# Patient Record
Sex: Female | Born: 1935 | Race: Black or African American | Hispanic: No | Smoking: Never smoker
Health system: Southern US, Community
[De-identification: ages and names within clinical notes are randomized; demographics above are authoritative.]

## PROBLEM LIST (undated history)

## (undated) DIAGNOSIS — R109 Unspecified abdominal pain: Secondary | ICD-10-CM

## (undated) DIAGNOSIS — I739 Peripheral vascular disease, unspecified: Secondary | ICD-10-CM

## (undated) DIAGNOSIS — J189 Pneumonia, unspecified organism: Secondary | ICD-10-CM

## (undated) DIAGNOSIS — M1990 Unspecified osteoarthritis, unspecified site: Secondary | ICD-10-CM

## (undated) DIAGNOSIS — M889 Osteitis deformans of unspecified bone: Secondary | ICD-10-CM

## (undated) DIAGNOSIS — I10 Essential (primary) hypertension: Secondary | ICD-10-CM

## (undated) DIAGNOSIS — R809 Proteinuria, unspecified: Secondary | ICD-10-CM

## (undated) DIAGNOSIS — R05 Cough: Secondary | ICD-10-CM

## (undated) DIAGNOSIS — R058 Other specified cough: Secondary | ICD-10-CM

## (undated) DIAGNOSIS — K635 Polyp of colon: Secondary | ICD-10-CM

## (undated) DIAGNOSIS — R32 Unspecified urinary incontinence: Secondary | ICD-10-CM

## (undated) DIAGNOSIS — H9190 Unspecified hearing loss, unspecified ear: Secondary | ICD-10-CM

## (undated) DIAGNOSIS — R609 Edema, unspecified: Secondary | ICD-10-CM

## (undated) DIAGNOSIS — F419 Anxiety disorder, unspecified: Secondary | ICD-10-CM

## (undated) DIAGNOSIS — I639 Cerebral infarction, unspecified: Secondary | ICD-10-CM

## (undated) DIAGNOSIS — R269 Unspecified abnormalities of gait and mobility: Secondary | ICD-10-CM

## (undated) DIAGNOSIS — K5900 Constipation, unspecified: Secondary | ICD-10-CM

## (undated) DIAGNOSIS — I7090 Unspecified atherosclerosis: Secondary | ICD-10-CM

## (undated) DIAGNOSIS — M109 Gout, unspecified: Secondary | ICD-10-CM

## (undated) DIAGNOSIS — N189 Chronic kidney disease, unspecified: Secondary | ICD-10-CM

## (undated) DIAGNOSIS — K649 Unspecified hemorrhoids: Secondary | ICD-10-CM

## (undated) HISTORY — PX: PARTIAL HYSTERECTOMY: SHX80

---

## 2010-02-14 ENCOUNTER — Ambulatory Visit: Payer: Self-pay | Admitting: Gastroenterology

## 2011-02-21 ENCOUNTER — Ambulatory Visit: Payer: Self-pay

## 2011-03-21 ENCOUNTER — Emergency Department: Payer: Self-pay | Admitting: Internal Medicine

## 2011-03-21 LAB — COMPREHENSIVE METABOLIC PANEL
Albumin: 3.4 g/dL (ref 3.4–5.0)
Anion Gap: 11 (ref 7–16)
BUN: 20 mg/dL — ABNORMAL HIGH (ref 7–18)
Bilirubin,Total: 0.5 mg/dL (ref 0.2–1.0)
Chloride: 103 mmol/L (ref 98–107)
Creatinine: 1.64 mg/dL — ABNORMAL HIGH (ref 0.60–1.30)
Glucose: 136 mg/dL — ABNORMAL HIGH (ref 65–99)
Potassium: 3.9 mmol/L (ref 3.5–5.1)
SGOT(AST): 14 U/L — ABNORMAL LOW (ref 15–37)
Total Protein: 7.9 g/dL (ref 6.4–8.2)

## 2011-03-21 LAB — CBC
HCT: 38 % (ref 35.0–47.0)
MCH: 31.6 pg (ref 26.0–34.0)
MCHC: 32.8 g/dL (ref 32.0–36.0)
MCV: 97 fL (ref 80–100)
RDW: 14.5 % (ref 11.5–14.5)
WBC: 9.5 10*3/uL (ref 3.6–11.0)

## 2011-03-21 LAB — TROPONIN I: Troponin-I: <0.02 ng/mL

## 2011-03-21 LAB — CK TOTAL AND CKMB (NOT AT ARMC): CK-MB: 0.5 ng/mL (ref 0.5–3.6)

## 2011-07-10 ENCOUNTER — Ambulatory Visit: Payer: Self-pay | Admitting: Internal Medicine

## 2011-07-23 ENCOUNTER — Ambulatory Visit: Payer: Self-pay

## 2012-10-27 ENCOUNTER — Emergency Department: Payer: Self-pay | Admitting: Emergency Medicine

## 2013-03-09 ENCOUNTER — Emergency Department: Payer: Self-pay | Admitting: Emergency Medicine

## 2013-03-09 LAB — COMPREHENSIVE METABOLIC PANEL
Anion Gap: 3 — ABNORMAL LOW (ref 7–16)
BUN: 28 mg/dL — ABNORMAL HIGH (ref 7–18)
Chloride: 108 mmol/L — ABNORMAL HIGH (ref 98–107)
Co2: 28 mmol/L (ref 21–32)
EGFR (African American): 38 — ABNORMAL LOW
EGFR (Non-African Amer.): 33 — ABNORMAL LOW
Glucose: 150 mg/dL — ABNORMAL HIGH (ref 65–99)
SGPT (ALT): 13 U/L (ref 12–78)
Sodium: 139 mmol/L (ref 136–145)

## 2013-03-09 LAB — CBC
HCT: 37.8 % (ref 35.0–47.0)
MCH: 31.1 pg (ref 26.0–34.0)
MCHC: 33.4 g/dL (ref 32.0–36.0)
MCV: 93 fL (ref 80–100)
Platelet: 188 10*3/uL (ref 150–440)
RBC: 4.06 10*6/uL (ref 3.80–5.20)
RDW: 14 % (ref 11.5–14.5)

## 2013-03-09 LAB — RAPID INFLUENZA A&B ANTIGENS

## 2013-05-28 ENCOUNTER — Emergency Department: Payer: Self-pay | Admitting: Emergency Medicine

## 2013-05-28 LAB — URINALYSIS, COMPLETE
BACTERIA: NONE SEEN
BILIRUBIN, UR: NEGATIVE
BLOOD: NEGATIVE
Glucose,UR: NEGATIVE mg/dL (ref 0–75)
Ketone: NEGATIVE
NITRITE: NEGATIVE
PH: 5 (ref 4.5–8.0)
Protein: NEGATIVE
Specific Gravity: 1.019 (ref 1.003–1.030)
Squamous Epithelial: 1

## 2013-08-19 ENCOUNTER — Emergency Department: Payer: Self-pay | Admitting: Emergency Medicine

## 2013-08-31 LAB — BASIC METABOLIC PANEL
BUN: 15 mg/dL (ref 4–21)
Creatinine: 1.2 mg/dL — AB (ref <=1.1)

## 2013-08-31 LAB — TSH: TSH: 0.95 u[IU]/mL (ref <=5.90)

## 2013-09-09 LAB — HM COLONOSCOPY: HM Colonoscopy: 3

## 2013-09-14 ENCOUNTER — Ambulatory Visit: Payer: Self-pay | Admitting: Gastroenterology

## 2014-02-09 LAB — HM MAMMOGRAPHY: HM Mammogram: NORMAL

## 2014-02-19 ENCOUNTER — Ambulatory Visit: Payer: Self-pay | Admitting: Internal Medicine

## 2014-02-26 ENCOUNTER — Emergency Department: Payer: Self-pay | Admitting: Emergency Medicine

## 2014-02-26 LAB — CBC WITH DIFFERENTIAL/PLATELET
Basophil #: 0 10*3/uL (ref 0.0–0.1)
Basophil %: 0.4 %
EOS PCT: 0.2 %
Eosinophil #: 0 10*3/uL (ref 0.0–0.7)
HCT: 41.4 % (ref 35.0–47.0)
HGB: 13.4 g/dL (ref 12.0–16.0)
LYMPHS ABS: 1.7 10*3/uL (ref 1.0–3.6)
LYMPHS PCT: 19.4 %
MCH: 31.3 pg (ref 26.0–34.0)
MCHC: 32.3 g/dL (ref 32.0–36.0)
MCV: 97 fL (ref 80–100)
Monocyte #: 0.4 x10 3/mm (ref 0.2–0.9)
Monocyte %: 4.5 %
Neutrophil #: 6.6 10*3/uL — ABNORMAL HIGH (ref 1.4–6.5)
Neutrophil %: 75.5 %
Platelet: 228 10*3/uL (ref 150–440)
RBC: 4.27 10*6/uL (ref 3.80–5.20)
RDW: 14.5 % (ref 11.5–14.5)
WBC: 8.8 10*3/uL (ref 3.6–11.0)

## 2014-02-26 LAB — BASIC METABOLIC PANEL
ANION GAP: 10 (ref 7–16)
BUN: 25 mg/dL — ABNORMAL HIGH (ref 7–18)
CALCIUM: 9 mg/dL (ref 8.5–10.1)
CREATININE: 1.46 mg/dL — AB (ref 0.60–1.30)
Chloride: 107 mmol/L (ref 98–107)
Co2: 24 mmol/L (ref 21–32)
EGFR (African American): 44 — ABNORMAL LOW
EGFR (Non-African Amer.): 36 — ABNORMAL LOW
Glucose: 158 mg/dL — ABNORMAL HIGH (ref 65–99)
OSMOLALITY: 289 (ref 275–301)
POTASSIUM: 3.7 mmol/L (ref 3.5–5.1)
SODIUM: 141 mmol/L (ref 136–145)

## 2014-02-26 LAB — URINALYSIS, COMPLETE
Bacteria: NONE SEEN
Bilirubin,UR: NEGATIVE
Blood: NEGATIVE
GLUCOSE, UR: NEGATIVE mg/dL (ref 0–75)
Granular Cast: 12
Hyaline Cast: 15
Ketone: NEGATIVE
NITRITE: NEGATIVE
Ph: 5 (ref 4.5–8.0)
Protein: 100
RBC,UR: 23 /HPF (ref 0–5)
Specific Gravity: 1.024 (ref 1.003–1.030)
Squamous Epithelial: 5
WBC UR: 435 /HPF (ref 0–5)

## 2014-02-26 LAB — TROPONIN I
Troponin-I: 0.1 ng/mL — ABNORMAL HIGH
Troponin-I: 0.12 ng/mL — ABNORMAL HIGH

## 2014-03-02 ENCOUNTER — Emergency Department: Payer: Self-pay | Admitting: Emergency Medicine

## 2014-03-02 LAB — URINALYSIS, COMPLETE
Bilirubin,UR: NEGATIVE
Blood: NEGATIVE
GLUCOSE, UR: NEGATIVE mg/dL (ref 0–75)
KETONE: NEGATIVE
Nitrite: NEGATIVE
PH: 6 (ref 4.5–8.0)
SPECIFIC GRAVITY: 1.025 (ref 1.003–1.030)
WBC UR: 21 /HPF (ref 0–5)

## 2014-03-02 LAB — CBC
HCT: 37.9 % (ref 35.0–47.0)
HGB: 13 g/dL (ref 12.0–16.0)
MCH: 33.1 pg (ref 26.0–34.0)
MCHC: 34.3 g/dL (ref 32.0–36.0)
MCV: 97 fL (ref 80–100)
Platelet: 198 10*3/uL (ref 150–440)
RBC: 3.92 10*6/uL (ref 3.80–5.20)
RDW: 14.1 % (ref 11.5–14.5)
WBC: 6 10*3/uL (ref 3.6–11.0)

## 2014-03-02 LAB — COMPREHENSIVE METABOLIC PANEL
ALK PHOS: 71 U/L
Albumin: 3.5 g/dL (ref 3.4–5.0)
Anion Gap: 8 (ref 7–16)
BILIRUBIN TOTAL: 0.4 mg/dL (ref 0.2–1.0)
BUN: 18 mg/dL (ref 7–18)
CHLORIDE: 109 mmol/L — AB (ref 98–107)
CO2: 25 mmol/L (ref 21–32)
Calcium, Total: 8.7 mg/dL (ref 8.5–10.1)
Creatinine: 1.46 mg/dL — ABNORMAL HIGH (ref 0.60–1.30)
EGFR (African American): 44 — ABNORMAL LOW
EGFR (Non-African Amer.): 36 — ABNORMAL LOW
GLUCOSE: 131 mg/dL — AB (ref 65–99)
OSMOLALITY: 287 (ref 275–301)
Potassium: 3.5 mmol/L (ref 3.5–5.1)
SGOT(AST): 16 U/L (ref 15–37)
SGPT (ALT): 11 U/L — ABNORMAL LOW
SODIUM: 142 mmol/L (ref 136–145)
Total Protein: 7 g/dL (ref 6.4–8.2)

## 2014-03-02 LAB — TROPONIN I: TROPONIN-I: 0.04 ng/mL

## 2014-03-02 LAB — LIPASE, BLOOD: LIPASE: 58 U/L — AB (ref 73–393)

## 2014-03-03 LAB — URINE CULTURE

## 2014-06-06 ENCOUNTER — Emergency Department: Payer: Self-pay | Admitting: Emergency Medicine

## 2014-06-06 LAB — CBC
HCT: 38.6 % (ref 35.0–47.0)
HGB: 12.3 g/dL (ref 12.0–16.0)
MCH: 30.2 pg (ref 26.0–34.0)
MCHC: 31.9 g/dL — AB (ref 32.0–36.0)
MCV: 95 fL (ref 80–100)
Platelet: 252 10*3/uL (ref 150–440)
RBC: 4.07 10*6/uL (ref 3.80–5.20)
RDW: 13.3 % (ref 11.5–14.5)
WBC: 11.4 10*3/uL — ABNORMAL HIGH (ref 3.6–11.0)

## 2014-06-06 LAB — COMPREHENSIVE METABOLIC PANEL
ALT: 10 U/L — AB
Albumin: 3.6 g/dL
Alkaline Phosphatase: 74 U/L
Anion Gap: 10 (ref 7–16)
BUN: 34 mg/dL — ABNORMAL HIGH
Bilirubin,Total: 0.4 mg/dL
Calcium, Total: 9 mg/dL
Chloride: 105 mmol/L
Co2: 24 mmol/L
Creatinine: 1.44 mg/dL — ABNORMAL HIGH
EGFR (African American): 37 — ABNORMAL LOW
GFR CALC NON AF AMER: 32 — AB
Glucose: 170 mg/dL — ABNORMAL HIGH
POTASSIUM: 4 mmol/L
SGOT(AST): 18 U/L
Sodium: 139 mmol/L
Total Protein: 7.3 g/dL

## 2014-06-06 LAB — TROPONIN I
TROPONIN-I: 0.04 ng/mL — AB
TROPONIN-I: 0.03 ng/mL

## 2014-06-06 LAB — URINALYSIS, COMPLETE
BILIRUBIN, UR: NEGATIVE
BLOOD: NEGATIVE
Bacteria: NONE SEEN
Glucose,UR: NEGATIVE mg/dL (ref 0–75)
Ketone: NEGATIVE
Leukocyte Esterase: NEGATIVE
Nitrite: NEGATIVE
PROTEIN: NEGATIVE
Ph: 5 (ref 4.5–8.0)
SPECIFIC GRAVITY: 1.017 (ref 1.003–1.030)
Squamous Epithelial: NONE SEEN
WBC UR: 2 /HPF (ref 0–5)

## 2014-06-06 LAB — TSH: Thyroid Stimulating Horm: 0.651 u[IU]/mL

## 2014-07-05 ENCOUNTER — Encounter: Payer: Self-pay | Admitting: Internal Medicine

## 2014-07-05 DIAGNOSIS — M2010 Hallux valgus (acquired), unspecified foot: Secondary | ICD-10-CM | POA: Insufficient documentation

## 2014-07-05 DIAGNOSIS — K2970 Gastritis, unspecified, without bleeding: Secondary | ICD-10-CM

## 2014-07-05 DIAGNOSIS — M179 Osteoarthritis of knee, unspecified: Secondary | ICD-10-CM | POA: Insufficient documentation

## 2014-07-05 DIAGNOSIS — I10 Essential (primary) hypertension: Secondary | ICD-10-CM | POA: Insufficient documentation

## 2014-07-05 DIAGNOSIS — F419 Anxiety disorder, unspecified: Secondary | ICD-10-CM | POA: Insufficient documentation

## 2014-07-05 DIAGNOSIS — M1710 Unilateral primary osteoarthritis, unspecified knee: Secondary | ICD-10-CM | POA: Insufficient documentation

## 2014-07-05 DIAGNOSIS — Z86010 Personal history of colonic polyps: Secondary | ICD-10-CM | POA: Insufficient documentation

## 2014-07-05 DIAGNOSIS — B9681 Helicobacter pylori [H. pylori] as the cause of diseases classified elsewhere: Secondary | ICD-10-CM | POA: Insufficient documentation

## 2014-09-03 ENCOUNTER — Ambulatory Visit (INDEPENDENT_AMBULATORY_CARE_PROVIDER_SITE_OTHER): Payer: Medicare Other | Admitting: Internal Medicine

## 2014-09-03 ENCOUNTER — Encounter: Payer: Self-pay | Admitting: Internal Medicine

## 2014-09-03 VITALS — BP 138/64 | HR 80 | Ht 64.25 in | Wt 184.4 lb

## 2014-09-03 DIAGNOSIS — K2970 Gastritis, unspecified, without bleeding: Secondary | ICD-10-CM

## 2014-09-03 DIAGNOSIS — Z0000 Encounter for general adult medical examination without abnormal findings: Secondary | ICD-10-CM | POA: Diagnosis not present

## 2014-09-03 DIAGNOSIS — I10 Essential (primary) hypertension: Secondary | ICD-10-CM | POA: Diagnosis not present

## 2014-09-03 DIAGNOSIS — B9681 Helicobacter pylori [H. pylori] as the cause of diseases classified elsewhere: Secondary | ICD-10-CM

## 2014-09-03 DIAGNOSIS — M170 Bilateral primary osteoarthritis of knee: Secondary | ICD-10-CM

## 2014-09-03 LAB — POCT URINALYSIS DIPSTICK
Bilirubin, UA: NEGATIVE
Blood, UA: NEGATIVE
Glucose, UA: NEGATIVE
Ketones, UA: NEGATIVE
LEUKOCYTES UA: NEGATIVE
Nitrite, UA: NEGATIVE
PROTEIN UA: NEGATIVE
Spec Grav, UA: 1.015
Urobilinogen, UA: 0.2
pH, UA: 7

## 2014-09-03 MED ORDER — FLUTICASONE PROPIONATE 50 MCG/ACT NA SUSP: 2.0000 | Freq: Every day | NASAL | Status: DC

## 2014-09-03 MED ORDER — FLUTICASONE PROPIONATE 50 MCG/ACT NA SUSP: 2.0000 | Freq: Every day | NASAL | Status: AC

## 2014-09-03 NOTE — Progress Notes (Signed)
Patient: Katelyn [name], Female    DOB: 1923-07-24, 81 y.o.   MRN: [id number] Visit Date: 09/03/2014  Today's Provider: Halina Maidens, MD   Chief Complaint  Patient presents with  . Medicare Wellness   Subjective:    Annual wellness visit Katelyn [name] is a 81 y.o. female who presents today for her Subsequent Annual Wellness Visit. She feels fairly well. She reports exercising very little. She reports she is sleeping fairly well.   ----------------------------------------------------------- Hypertension This is a chronic problem. The problem is unchanged. The problem is controlled. Pertinent negatives include no chest pain, palpitations or shortness of breath. There are no associated agents to hypertension. Past treatments include angiotensin blockers and calcium channel blockers. The current treatment provides significant improvement. There are no compliance problems.    She also has intermittent knee discomfort.  She does not do much activity - she worked hard all her life and now she wants to relax.  She denies knee pain as limiting her.  We discussed increasing physical activity - family has encouraged her as well.  She takes aleve occasionally for knee pain.  There has been no swelling or weakness.   Review of Systems  Constitutional: Negative for fever, activity change and appetite change.  HENT: Positive for hearing loss, postnasal drip and sneezing. Negative for trouble swallowing and voice change.   Eyes: Negative for visual disturbance.  Respiratory: Negative for chest tightness, shortness of breath and wheezing.   Cardiovascular: Negative for chest pain, palpitations and leg swelling.  Gastrointestinal: Negative for abdominal pain, diarrhea and constipation.  Genitourinary: Negative for dysuria, urgency, frequency and hematuria.  Musculoskeletal: Negative for arthralgias.  Skin: Negative for color change and rash.  Neurological: Negative for dizziness and  tremors.  Hematological: Negative for adenopathy.  Psychiatric/Behavioral: Negative for confusion and dysphoric mood.    History   Social History  . Marital Status: Widowed    Spouse Name: N/A  . Number of Children: N/A  . Years of Education: N/A   Occupational History  . Not on file.   Social History Main Topics  . Smoking status: Never Smoker   . Smokeless tobacco: Not on file  . Alcohol Use: No  . Drug Use: No  . Sexual Activity: Not on file   Other Topics Concern  . Not on file   Social History Narrative    Patient Active Problem List   Diagnosis Date Noted  . Anxiety, mild 07/05/2014  . Essential (primary) hypertension 07/05/2014  . Acquired hallux valgus 07/05/2014  . Gastritis, Helicobacter pylori [id number]  . History of colon polyps 07/05/2014  . Arthritis of knee, degenerative 07/05/2014    Past Surgical History  Procedure Laterality Date  . Partial hysterectomy      Her Family history is unknown by patient.    Previous Medications   AMLODIPINE (NORVASC) 10 MG TABLET    Take 1 tablet by mouth daily.   LOSARTAN (COZAAR) 50 MG TABLET    Take 1 tablet by mouth daily.   MECLIZINE (ANTIVERT) 25 MG TABLET    Take 1 tablet by mouth 3 (three) times daily as needed.   MIRTAZAPINE (REMERON) 15 MG TABLET    Take 1 tablet by mouth at bedtime.   OMEPRAZOLE (PRILOSEC) 20 MG CAPSULE    Take 1 capsule by mouth daily.    Patient Care Team: Glean Hess, MD as PCP - General (Family Medicine)     Objective:   Vitals: BP 138/64 mmHg  Pulse  80  Ht 5' 4.25" (1.632 m)  Wt 184 lb 6.4 oz (83.643 kg)  BMI 31.40 kg/m2  Physical Exam  Constitutional: She is oriented to person, place, and time. She appears well-developed and well-nourished. No distress.  HENT:  Ears:  Nose: Nose normal. Right sinus exhibits no maxillary sinus tenderness. Left sinus exhibits no maxillary sinus tenderness.  Neck: Normal range of motion. Neck supple. No tracheal tenderness  present. Carotid bruit is not present. No thyromegaly present.  Cardiovascular: Normal rate, regular rhythm and S1 normal.   Murmur heard.  Systolic murmur is present with a grade of 2/6  Pulses:      Dorsalis pedis pulses are 1+ on the right side, and 1+ on the left side.       Posterior tibial pulses are 1+ on the right side, and 1+ on the left side.  Pulmonary/Chest: Breath sounds normal. She has no wheezes. She has no rhonchi.  Abdominal: Soft. Normal appearance. She exhibits no distension. There is no hepatosplenomegaly. There is no tenderness.  Genitourinary: No breast swelling, tenderness, discharge or bleeding.  Musculoskeletal:       Right knee: She exhibits normal range of motion.       Left knee: She exhibits normal range of motion.  Lymphadenopathy:    She has no cervical adenopathy.    She has no axillary adenopathy.  Neurological: She is alert and oriented to person, place, and time. She has normal reflexes.  Skin: Skin is warm, dry and intact.  Psychiatric: She has a normal mood and affect. Her speech is normal. Cognition and memory are normal.    Activities of Daily Living In your present state of health, do you have any difficulty performing the following activities: 09/03/2014  Hearing? Y  Vision? N  Difficulty concentrating or making decisions? N  Walking or climbing stairs? Y  Dressing or bathing? N  Doing errands, shopping? Y    Fall Risk Assessment Fall Risk  09/03/2014  Falls in the past year? No     Patient reports there are safety devices in place in shower at home.   Depression Screen PHQ 2/9 Scores 09/03/2014  PHQ - 2 Score 0    Cognitive Testing - 6-CIT   Correct? Score   What year is it? yes 0 Yes = 0    No = 4  What month is it? yes 0 Yes = 0    No = 3  Remember:     [name], [place], [place]     What time is it? yes 0 Yes = 0    No = 3  Count backwards from 20 to 1 yes 1 Correct = 0    1 error = 2   More than 1 error = 4  Say  the months of the year in reverse. yes 0 Correct = 0    1 error = 2   More than 1 error = 4  What address did I ask you to remember? no 6 Correct = 0  1 error = 2    2 error = 4    3 error = 6    4 error = 8    All wrong = 10       TOTAL SCORE  6/28   Interpretation:  Normal  Normal (0-7) Abnormal (8-28)        Assessment & Plan:     Annual Wellness Visit  Reviewed patient's Family Medical History Reviewed  and updated list of patient's medical providers Assessment of cognitive impairment was done Assessed patient's functional ability Established a written schedule for health screening [place] Completed and Reviewed  Exercise Activities and Dietary recommendations Goals    . <enter goal here>     Try to walk to the mailbox every day.       Immunization History  Administered Date(s) Administered  . Pneumococcal Conjugate-13 05/24/2014    Health Maintenance  Topic Date Due  . TETANUS/TDAP  10/05/1944  . ZOSTAVAX  10/05/1985  . DEXA SCAN  10/06/1990  . INFLUENZA VACCINE  10/11/2014  . PNA vac Low Risk Adult (2 of 2 - PPSV23) 05/24/2015  . COLONOSCOPY  09/10/2023     Discussed health benefits of physical activity, and encouraged her to engage in regular exercise appropriate for her age and condition.    ------------------------------------------------------------------------------------------------------------  1. Annual physical exam Discussed mammogram - not indicated due to age Pt should continue self exams  2. Essential (primary) hypertension controlled - CBC with Differential/Platelet - Comprehensive metabolic panel - TSH - POCT urinalysis dipstick  3. Primary osteoarthritis of both knees Stable - may take aleve intermittently  4. Gastritis, Helicobacter pylori Treated in the past without recurrent symptoms - CBC with Differential/Platelet  Halina Maidens, MD [facility]  09/03/2014

## 2014-09-03 NOTE — Patient Instructions (Signed)
Health Maintenance  Topic Date Due  . TETANUS/TDAP  Not indicated todayBreast Self-Awareness Practicing breast self-awareness may pick up problems early, prevent significant medical complications, and possibly save your life. By practicing breast self-awareness, you can become familiar with how your breasts look and feel and if your breasts are changing. This allows you to notice changes early. It can also offer you some reassurance that your breast health is good. One way to learn what is normal for your breasts and whether your breasts are changing is to do a breast self-exam. If you find a lump or something that was not present in the past, it is best to contact your caregiver right away. Other findings that should be evaluated by your caregiver include nipple discharge, especially if it is bloody; skin changes or reddening; areas where the skin seems to be pulled in (retracted); or new lumps and bumps. Breast pain is seldom associated with cancer (malignancy), but should also be evaluated by a caregiver. HOW TO PERFORM A BREAST SELF-EXAM The best time to examine your breasts is 5-7 days after your menstrual period is over. During menstruation, the breasts are lumpier, and it may be more difficult to pick up changes. If you do not menstruate, have reached menopause, or had your uterus removed (hysterectomy), you should examine your breasts at regular intervals, such as monthly. If you are breastfeeding, examine your breasts after a feeding or after using a breast pump. Breast implants do not decrease the risk for lumps or tumors, so continue to perform breast self-exams as recommended. Talk to your caregiver about how to determine the difference between the implant and breast tissue. Also, talk about the amount of pressure you should use during the exam. Over time, you will become more familiar with the variations of your breasts and more comfortable with the exam. A breast self-exam requires you to remove  all your clothes above the waist. 1. Look at your breasts and nipples. Stand in front of a mirror in a room with good lighting. With your hands on your hips, push your hands firmly downward. Look for a difference in shape, contour, and size from one breast to the other (asymmetry). Asymmetry includes puckers, dips, or bumps. Also, look for skin changes, such as reddened or scaly areas on the breasts. Look for nipple changes, such as discharge, dimpling, repositioning, or redness. 2. Carefully feel your breasts. This is best done either in the shower or tub while using soapy water or when flat on your back. Place the arm (on the side of the breast you are examining) above your head. Use the pads (not the fingertips) of your three middle fingers on your opposite hand to feel your breasts. Start in the underarm area and use  inch (2 cm) overlapping circles to feel your breast. Use 3 different levels of pressure (light, medium, and firm pressure) at each circle before moving to the next circle. The light pressure is needed to feel the tissue closest to the skin. The medium pressure will help to feel breast tissue a little deeper, while the firm pressure is needed to feel the tissue close to the ribs. Continue the overlapping circles, moving downward over the breast until you feel your ribs below your breast. Then, move one finger-width towards the center of the body. Continue to use the  inch (2 cm) overlapping circles to feel your breast as you move slowly up toward the collar bone (clavicle) near the base of the neck. Continue the  up and down exam using all 3 pressures until you reach the middle of the chest. Do this with each breast, carefully feeling for lumps or changes. 3.  Keep a written record with breast changes or normal findings for each breast. By writing this information down, you do not need to depend only on memory for size, tenderness, or location. Write down where you are in your menstrual cycle,  if you are still menstruating. Breast tissue can have some lumps or thick tissue. However, see your caregiver if you find anything that concerns you.  SEEK MEDICAL CARE IF:  You see a change in shape, contour, or size of your breasts or nipples.   You see skin changes, such as reddened or scaly areas on the breasts or nipples.   You have an unusual discharge from your nipples.   You feel a new lump or unusually thick areas.  Document Released: 02/26/2005 Document Revised: 02/13/2012 Document Reviewed: 06/13/2011 [facility] Patient Information 2015 [place], [place]. This information is not intended to replace advice given to you by your health care provider. Make sure you discuss any questions you have with your health care provider.   Marland Kitchen ZOSTAVAX  10/05/1985  . DEXA SCAN  10/06/1990  . INFLUENZA VACCINE  10/11/2014  . PNA vac Low Risk Adult (2 of 2 - PPSV23) completed  . COLONOSCOPY  09/10/2023

## 2014-09-04 LAB — CBC WITH DIFFERENTIAL/PLATELET
Basophils Absolute: 0 10*3/uL (ref 0.0–0.2)
Basos: 0 %
EOS (ABSOLUTE): 0.1 10*3/uL (ref 0.0–0.4)
Eos: 1 %
HEMATOCRIT: 37.5 % (ref 34.0–46.6)
Hemoglobin: 12.4 g/dL (ref 11.1–15.9)
IMMATURE GRANULOCYTES: 0 %
Immature Grans (Abs): 0 10*3/uL (ref 0.0–0.1)
LYMPHS ABS: 2.3 10*3/uL (ref 0.7–3.1)
Lymphs: 39 %
MCH: 30.8 pg (ref 26.6–33.0)
MCHC: 33.1 g/dL (ref 31.5–35.7)
MCV: 93 fL (ref 79–97)
MONOCYTES: 6 %
Monocytes Absolute: 0.3 10*3/uL (ref 0.1–0.9)
NEUTROS ABS: 3.2 10*3/uL (ref 1.4–7.0)
NEUTROS PCT: 54 %
Platelets: 227 10*3/uL (ref 150–379)
RBC: 4.02 x10E6/uL (ref 3.77–5.28)
RDW: 15.3 % (ref 12.3–15.4)
WBC: 5.8 10*3/uL (ref 3.4–10.8)

## 2014-09-04 LAB — COMPREHENSIVE METABOLIC PANEL
ALBUMIN: 3.8 g/dL (ref 3.5–4.7)
ALT: 9 IU/L (ref 0–32)
AST: 15 IU/L (ref 0–40)
Albumin/Globulin Ratio: 1.3 (ref 1.1–2.5)
Alkaline Phosphatase: 87 IU/L (ref 39–117)
BUN/Creatinine Ratio: 16 (ref 11–26)
BUN: 23 mg/dL (ref 8–27)
Bilirubin Total: 0.3 mg/dL (ref 0.0–1.2)
CHLORIDE: 103 mmol/L (ref 97–108)
CO2: 24 mmol/L (ref 18–29)
CREATININE: 1.4 mg/dL — AB (ref 0.57–1.00)
Calcium: 9.2 mg/dL (ref 8.7–10.3)
GFR, EST AFRICAN AMERICAN: 39 mL/min/{1.73_m2} — AB (ref >59)
GFR, EST NON AFRICAN AMERICAN: 34 mL/min/{1.73_m2} — AB (ref >59)
Globulin, Total: 2.9 g/dL (ref 1.5–4.5)
Glucose: 99 mg/dL (ref 65–99)
POTASSIUM: 4.6 mmol/L (ref 3.5–5.2)
SODIUM: 142 mmol/L (ref 134–144)
TOTAL PROTEIN: 6.7 g/dL (ref 6.0–8.5)

## 2014-09-04 LAB — TSH: TSH: 0.599 u[IU]/mL (ref 0.450–4.500)

## 2014-09-22 ENCOUNTER — Other Ambulatory Visit: Payer: Self-pay | Admitting: Internal Medicine

## 2014-10-01 ENCOUNTER — Emergency Department: Payer: Medicare Other

## 2014-10-01 ENCOUNTER — Encounter: Payer: Self-pay | Admitting: Emergency Medicine

## 2014-10-01 ENCOUNTER — Emergency Department
Admission: EM | Admit: 2014-10-01 | Discharge: 2014-10-01 | Disposition: A | Discharge status: Home / Self Care | Payer: Medicare Other | Attending: Emergency Medicine | Admitting: Emergency Medicine

## 2014-10-01 ENCOUNTER — Other Ambulatory Visit: Payer: Self-pay

## 2014-10-01 DIAGNOSIS — J159 Unspecified bacterial pneumonia: Secondary | ICD-10-CM | POA: Diagnosis not present

## 2014-10-01 DIAGNOSIS — R531 Weakness: Secondary | ICD-10-CM | POA: Insufficient documentation

## 2014-10-01 DIAGNOSIS — F419 Anxiety disorder, unspecified: Secondary | ICD-10-CM | POA: Diagnosis not present

## 2014-10-01 DIAGNOSIS — J189 Pneumonia, unspecified organism: Secondary | ICD-10-CM

## 2014-10-01 DIAGNOSIS — Z79899 Other long term (current) drug therapy: Secondary | ICD-10-CM | POA: Diagnosis not present

## 2014-10-01 DIAGNOSIS — Z7951 Long term (current) use of inhaled steroids: Secondary | ICD-10-CM | POA: Insufficient documentation

## 2014-10-01 DIAGNOSIS — I10 Essential (primary) hypertension: Secondary | ICD-10-CM | POA: Diagnosis not present

## 2014-10-01 DIAGNOSIS — R509 Fever, unspecified: Secondary | ICD-10-CM | POA: Diagnosis present

## 2014-10-01 HISTORY — DX: Essential (primary) hypertension: I10

## 2014-10-01 LAB — COMPREHENSIVE METABOLIC PANEL
ALBUMIN: 3.8 g/dL (ref 3.5–5.0)
ALT: 7 U/L — AB (ref 14–54)
AST: 19 U/L (ref 15–41)
Alkaline Phosphatase: 65 U/L (ref 38–126)
Anion gap: 10 (ref 5–15)
BUN: 26 mg/dL — AB (ref 6–20)
CALCIUM: 9.2 mg/dL (ref 8.9–10.3)
CO2: 23 mmol/L (ref 22–32)
Chloride: 110 mmol/L (ref 101–111)
Creatinine, Ser: 1.35 mg/dL — ABNORMAL HIGH (ref 0.44–1.00)
GFR calc Af Amer: 39 mL/min — ABNORMAL LOW (ref >60)
GFR calc non Af Amer: 34 mL/min — ABNORMAL LOW (ref >60)
GLUCOSE: 125 mg/dL — AB (ref 65–99)
POTASSIUM: 3.8 mmol/L (ref 3.5–5.1)
Sodium: 143 mmol/L (ref 135–145)
Total Bilirubin: 0.6 mg/dL (ref 0.3–1.2)
Total Protein: 7.5 g/dL (ref 6.5–8.1)

## 2014-10-01 LAB — URINALYSIS COMPLETE WITH MICROSCOPIC (ARMC ONLY)
BILIRUBIN URINE: NEGATIVE
Glucose, UA: NEGATIVE mg/dL
HGB URINE DIPSTICK: NEGATIVE
KETONES UR: NEGATIVE mg/dL
Nitrite: NEGATIVE
PH: 6 (ref 5.0–8.0)
PROTEIN: NEGATIVE mg/dL
SPECIFIC GRAVITY, URINE: 1.02 (ref 1.005–1.030)

## 2014-10-01 LAB — CBC
HEMATOCRIT: 38.4 % (ref 35.0–47.0)
HEMOGLOBIN: 12.6 g/dL (ref 12.0–16.0)
MCH: 31.1 pg (ref 26.0–34.0)
MCHC: 33 g/dL (ref 32.0–36.0)
MCV: 94.5 fL (ref 80.0–100.0)
Platelets: 197 10*3/uL (ref 150–440)
RBC: 4.06 MIL/uL (ref 3.80–5.20)
RDW: 13.6 % (ref 11.5–14.5)
WBC: 5.6 10*3/uL (ref 3.6–11.0)

## 2014-10-01 LAB — TROPONIN I: Troponin I: <0.03 ng/mL (ref <0.031)

## 2014-10-01 LAB — LIPASE, BLOOD: LIPASE: 18 U/L — AB (ref 22–51)

## 2014-10-01 LAB — CK: CK TOTAL: 47 U/L (ref 38–234)

## 2014-10-01 MED ORDER — KETOROLAC TROMETHAMINE 30 MG/ML IJ SOLN
INTRAMUSCULAR | Status: AC
  Administered 2014-10-01: 15 mg
  Filled 2014-10-01: qty 1

## 2014-10-01 MED ORDER — LEVOFLOXACIN 500 MG PO TABS
500.0000 mg | ORAL_TABLET | Freq: Once | ORAL | Status: AC
  Administered 2014-10-01: 500 mg via ORAL
  Filled 2014-10-01: qty 1

## 2014-10-01 MED ORDER — KETOROLAC TROMETHAMINE 30 MG/ML IJ SOLN: 15.0000 mg | Freq: Once | INTRAMUSCULAR | Status: AC

## 2014-10-01 MED ORDER — SODIUM CHLORIDE 0.9 % IV SOLN
1000.0000 mL | Freq: Once | INTRAVENOUS | Status: AC
  Administered 2014-10-01: 1000 mL via INTRAVENOUS

## 2014-10-01 MED ORDER — LEVOFLOXACIN 500 MG PO TABS: 500.0000 mg | ORAL_TABLET | Freq: Every day | ORAL | Status: AC

## 2014-10-01 NOTE — ED Notes (Signed)
Patient ambulated with minimal assistance to restroom.

## 2014-10-01 NOTE — ED Notes (Signed)
Patient complaining of generalized pain.  Dr. Corky Downs notified.  Verbal order revieved.

## 2014-10-01 NOTE — ED Provider Notes (Signed)
[facility] Emergency Department Provider Note  ____________________________________________  Time seen: On arrival  I have reviewed the triage vital signs and the nursing notes.   HISTORY  Chief Complaint "I'm sick"   HPI Anaili Sroufe Ferrari is a 81 y.o. female who presents with complaints of feeling sick. It is difficult to get more details from the patient but apparently she has been having dizziness and feels lightheaded when she stands. She denies chest pain she denies shortness of breath. She has had a mild cough. She denies fevers but does have chills. She has not had any abdominal pain nausea vomiting or diarrhea. She denies dysuria. She denies headache or neck pain and no focal deficits. When asked to provide more details she states simply "I'm sick"    Past Medical History  Diagnosis Date  . Hypertension     Patient Active Problem List   Diagnosis Date Noted  . Anxiety, mild 07/05/2014  . Essential (primary) hypertension 07/05/2014  . Acquired hallux valgus 07/05/2014  . Gastritis, Helicobacter pylori [id number]  . History of colon polyps 07/05/2014  . Arthritis of knee, degenerative 07/05/2014    Past Surgical History  Procedure Laterality Date  . Partial hysterectomy      Current Outpatient Rx  Name  Route  Sig  Dispense  Refill  . amLODipine (NORVASC) 10 MG tablet   Oral   Take 1 tablet by mouth daily.         . fluticasone (FLONASE) 50 MCG/ACT nasal spray   Each Nare   Place 2 sprays into both nostrils daily.   16 g   5   . losartan (COZAAR) 50 MG tablet      TAKE 1 TABLET BY MOUTH EACH DAY (FOR BLOOD PRESSURE)   30 tablet   5     This prescription was filled today. Any refills au ...   . mirtazapine (REMERON) 15 MG tablet   Oral   Take 1 tablet by mouth at bedtime.         Marland Kitchen omeprazole (PRILOSEC) 20 MG capsule   Oral   Take 1 capsule by mouth daily.           Allergies Review of patient's allergies  indicates no known allergies.  Family History  Problem Relation Age of Onset  . Family history unknown: Yes    Social History History  Substance Use Topics  . Smoking status: Never Smoker   . Smokeless tobacco: Not on file  . Alcohol Use: No    Review of Systems  Constitutional: Negative for fever. Positive for chills Eyes: Negative for visual changes. ENT: Negative for sore throat Cardiovascular: Negative for chest pain. Respiratory: Negative for shortness of breath. Gastrointestinal: Negative for abdominal pain, vomiting and diarrhea. Genitourinary: Negative for dysuria. Musculoskeletal: Negative for back pain. Skin: Negative for rash. Neurological: Negative for headaches or focal weakness. Positive dizziness Psychiatric: Positive for mild anxiety  10-point ROS otherwise negative.  ____________________________________________   PHYSICAL EXAM:  VITAL SIGNS: ED Triage Vitals  Enc Vitals Group     BP 10/01/14 0702 177/81 mmHg     Pulse Rate 10/01/14 0703 84     Resp 10/01/14 0704 18     Temp 10/01/14 0704 98.7 F (37.1 C)     Temp Source 10/01/14 0704 Oral     SpO2 10/01/14 0703 95 %     Weight 10/01/14 0704 190 lb (86.183 kg)     Height 10/01/14 0704 5\' 5"  (1.651 m)  Head Cir --      Peak Flow --      Pain Score 10/01/14 0706 0     Pain Loc --      Pain Edu? --      Excl. in [place]? --      Constitutional: Alert and oriented.  no distress. Eyes: Conjunctivae are normal.  ENT   Head: Normocephalic and atraumatic.   Mouth/Throat: Mucous membranes are moist. Cardiovascular: Normal rate, regular rhythm. Normal and symmetric distal pulses are present in all extremities. 2/6 systolic murmur Respiratory: Normal respiratory effort without tachypnea nor retractions. Breath sounds are clear and equal bilaterally.  Gastrointestinal: Soft and non-tender in all quadrants. No distention. There is no CVA tenderness. Genitourinary: deferred Musculoskeletal:  Nontender with normal range of motion in all extremities. No lower extremity tenderness nor edema. Neurologic:  Normal speech and language. No gross focal neurologic deficits are appreciated. PERRLA Skin:  Skin is warm, dry and intact. No rash noted. Psychiatric: Mood and affect are normal. Patient exhibits appropriate insight and judgment.  ____________________________________________    LABS (pertinent positives/negatives)  Labs Reviewed  COMPREHENSIVE METABOLIC PANEL - Abnormal; Notable for the following:    Glucose, Bld 125 (*)    BUN 26 (*)    Creatinine, Ser 1.35 (*)    ALT 7 (*)    GFR calc non Af Amer 34 (*)    GFR calc Af Amer 39 (*)    All other components within normal limits  URINALYSIS COMPLETEWITH MICROSCOPIC ([facility] ONLY) - Abnormal; Notable for the following:    Color, Urine YELLOW (*)    APPearance CLEAR (*)    Leukocytes, UA 1+ (*)    Bacteria, UA RARE (*)    Squamous Epithelial / LPF 0-5 (*)    All other components within normal limits  LIPASE, BLOOD - Abnormal; Notable for the following:    Lipase 18 (*)    All other components within normal limits  CBC  TROPONIN I  CK    ____________________________________________   EKG  ED ECG REPORT I, Lavonia Drafts, the attending physician, personally viewed and interpreted this ECG.  Date: 10/01/2014 EKG Time: 7:07 AM Rate: 81 Rhythm: normal sinus rhythm QRS Axis: normal Intervals: normal ST/T Wave abnormalities: normal Conduction Disutrbances: none Narrative Interpretation: unremarkable   ____________________________________________    RADIOLOGY I have personally reviewed any xrays that were ordered on this patient: Chest x-ray with possible new infiltrate versus atelectasis  ____________________________________________   PROCEDURES  Procedure(s) performed: none  Critical Care performed: none  ____________________________________________   INITIAL IMPRESSION / ASSESSMENT AND PLAN / ED  COURSE  Pertinent labs & imaging results that were available during my care of the patient were reviewed by me and considered in my medical decision making (see chart for details).  Unclear cause of patient's dizziness. We will give normal saline fluids, check labs, EKG, urine, chest x-ray and reevaluate ----------------------------------------- 10:20 AM on 10/01/2014 -----------------------------------------  Patient feels improved after normal saline. Lab work is essentially benign however chest x-ray may indicate early pneumonia. Patient is 100% on room air and has a normal CBC. We will ambulate the patient if she is able to get around okay on her own we will discharge her with by mouth Levaquin. ____________________________________________   FINAL CLINICAL IMPRESSION(S) / ED DIAGNOSES  Final diagnoses:  Community acquired pneumonia     Lavonia Drafts, MD 10/01/14 1500

## 2014-10-01 NOTE — ED Notes (Signed)
Pt reports cold sx, dizziness, weakness x 1 week.  Pt unable to elaborate much on sx, stating "i'm sick".  Pt NAD at this time.

## 2014-10-01 NOTE — Discharge Instructions (Signed)

## 2014-10-13 ENCOUNTER — Ambulatory Visit: Payer: Medicare Other

## 2014-10-13 ENCOUNTER — Ambulatory Visit
Admission: EM | Admit: 2014-10-13 | Discharge: 2014-10-13 | Disposition: A | Discharge status: Home / Self Care | Payer: Medicare Other | Attending: Family Medicine | Admitting: Family Medicine

## 2014-10-13 DIAGNOSIS — J9811 Atelectasis: Secondary | ICD-10-CM | POA: Diagnosis not present

## 2014-10-13 DIAGNOSIS — N189 Chronic kidney disease, unspecified: Secondary | ICD-10-CM | POA: Insufficient documentation

## 2014-10-13 DIAGNOSIS — R531 Weakness: Secondary | ICD-10-CM | POA: Diagnosis present

## 2014-10-13 DIAGNOSIS — R6883 Chills (without fever): Secondary | ICD-10-CM | POA: Diagnosis present

## 2014-10-13 LAB — BASIC METABOLIC PANEL
Anion gap: 9 (ref 5–15)
BUN: 20 mg/dL (ref 6–20)
CHLORIDE: 104 mmol/L (ref 101–111)
CO2: 25 mmol/L (ref 22–32)
Calcium: 9.1 mg/dL (ref 8.9–10.3)
Creatinine, Ser: 1.29 mg/dL — ABNORMAL HIGH (ref 0.44–1.00)
GFR calc Af Amer: 41 mL/min — ABNORMAL LOW (ref >60)
GFR calc non Af Amer: 36 mL/min — ABNORMAL LOW (ref >60)
GLUCOSE: 129 mg/dL — AB (ref 65–99)
Potassium: 3.5 mmol/L (ref 3.5–5.1)
Sodium: 138 mmol/L (ref 135–145)

## 2014-10-13 LAB — URINALYSIS COMPLETE WITH MICROSCOPIC (ARMC ONLY)
BILIRUBIN URINE: NEGATIVE
GLUCOSE, UA: NEGATIVE mg/dL
Ketones, ur: NEGATIVE mg/dL
Nitrite: NEGATIVE
PH: 6 (ref 5.0–8.0)
PROTEIN: 30 mg/dL — AB
Specific Gravity, Urine: 1.015 (ref 1.005–1.030)

## 2014-10-13 LAB — CBC WITH DIFFERENTIAL/PLATELET
BASOS ABS: 0 10*3/uL (ref 0–0.1)
Basophils Relative: 0 %
EOS PCT: 0 %
Eosinophils Absolute: 0 10*3/uL (ref 0–0.7)
HCT: 37.7 % (ref 35.0–47.0)
Hemoglobin: 12.6 g/dL (ref 12.0–16.0)
Lymphocytes Relative: 31 %
Lymphs Abs: 1.9 10*3/uL (ref 1.0–3.6)
MCH: 31.3 pg (ref 26.0–34.0)
MCHC: 33.4 g/dL (ref 32.0–36.0)
MCV: 93.8 fL (ref 80.0–100.0)
MONOS PCT: 6 %
Monocytes Absolute: 0.4 10*3/uL (ref 0.2–0.9)
NEUTROS ABS: 3.8 10*3/uL (ref 1.4–6.5)
NEUTROS PCT: 63 %
Platelets: 175 10*3/uL (ref 150–440)
RBC: 4.02 MIL/uL (ref 3.80–5.20)
RDW: 13.4 % (ref 11.5–14.5)
WBC: 6.1 10*3/uL (ref 3.6–11.0)

## 2014-10-13 NOTE — ED Provider Notes (Signed)
Patient presents today with symptoms of "feeling sick." Patient states that she was seen in the emergency department on 10/01/2014 and was treated for pneumonia with Levaquin for a week. She had the same similar complaint when she was in the ER. She admits to still having chills, sweats, cough and weakness. She also admits to taking off which she thinks is a tick from her right ankle but she is unsure. She is also unsure whether this was before she presented to the emergency department. She denies any chest pain, shortness of breath, severe headache, vomiting, abdominal pain, rash. She does admit to nasal congestion and nausea. Her daughter is in the office with her today and states that she is at her baseline mental status.  ROS: Negative except mentioned above. Vitals as per Epic  GENERAL: NAD HEENT: no pharyngeal erythema, no exudate, no erythema of TMs, no cervical LAD RESP: CTA B, no accessory muscle use noted  CARD: RRR ABD: +BS, NT/ND, no flank tenderness  SKIN: +scab noted right lateral ankle, no portion of the tick noted on right ankle, no erythema or streaking around that area, no skin rash appreciated elsewhere  A/P: Atelectasis- encouraged use of spirometer. Discussed with patient and family member results of the chest x-ray, no infiltrate noted. EKG is similar to the one done in the ER on 10/01/2014 with normal sinus rhythm and T-wave changes. Labs were reviewed. Patient does not have an elevated white blood cell count. She has chronic kidney disease and there does not appear to be a change in her baseline creatinine/GFR. Lyme titers and RMSF titers drawn. She can try a nasal spray for her nasal congestion. I've asked the patient to follow up with her primary care physician this week. If her symptoms were to worsen she is to go to the ER as discussed.     Paulina Fusi, MD 10/13/14 1434

## 2014-10-13 NOTE — ED Notes (Signed)
Patient states that she saw the Emergency Room on 10/01/2014. She states that she feels terrible. She states that she was treated with 1 week of Levaquin. She states that she feels like she has chills, sweats, cough and weakness. She states that she pulled something off her foot. She states that she knows something is wrong and she doesn't feel like she is improving.

## 2014-10-14 LAB — URINE CULTURE

## 2014-10-18 LAB — B. BURGDORFI ANTIBODIES: B burgdorferi Ab IgG+IgM: <0.91 {ISR} (ref 0.00–0.90)

## 2014-10-18 LAB — ROCKY MTN SPOTTED FVR ABS PNL(IGG+IGM)
RMSF IGM: 0.36 {index} (ref 0.00–0.89)
RMSF IgG: NEGATIVE

## 2014-10-20 ENCOUNTER — Ambulatory Visit (INDEPENDENT_AMBULATORY_CARE_PROVIDER_SITE_OTHER): Payer: Medicare Other | Admitting: Family Medicine

## 2014-10-20 ENCOUNTER — Ambulatory Visit: Payer: Self-pay | Admitting: Internal Medicine

## 2014-10-20 ENCOUNTER — Encounter: Payer: Self-pay | Admitting: Family Medicine

## 2014-10-20 VITALS — BP 160/78 | HR 64 | Ht 65.0 in | Wt 179.8 lb

## 2014-10-20 DIAGNOSIS — R739 Hyperglycemia, unspecified: Secondary | ICD-10-CM | POA: Diagnosis not present

## 2014-10-20 DIAGNOSIS — I10 Essential (primary) hypertension: Secondary | ICD-10-CM

## 2014-10-20 DIAGNOSIS — J189 Pneumonia, unspecified organism: Secondary | ICD-10-CM | POA: Diagnosis not present

## 2014-10-20 DIAGNOSIS — N183 Chronic kidney disease, stage 3 unspecified: Secondary | ICD-10-CM

## 2014-10-20 DIAGNOSIS — R5381 Other malaise: Secondary | ICD-10-CM | POA: Diagnosis not present

## 2014-10-20 LAB — POCT URINALYSIS DIPSTICK
BILIRUBIN UA: NEGATIVE
Blood, UA: NEGATIVE
Glucose, UA: NEGATIVE
Ketones, UA: NEGATIVE
LEUKOCYTES UA: NEGATIVE
Nitrite, UA: NEGATIVE
Protein, UA: NEGATIVE
Spec Grav, UA: 1.02
Urobilinogen, UA: 0.2
pH, UA: 5

## 2014-10-20 LAB — GLUCOSE, POCT (MANUAL RESULT ENTRY): POC Glucose: 115 mg/dl — AB (ref 70–99)

## 2014-10-20 NOTE — Progress Notes (Signed)
Date:  10/20/2014   Name:  Katelyn [name]   DOB:  03/19/1923   MRN:  [id number]  PCP:  Halina Maidens, MD    Chief Complaint: Pneumonia   History of Present Illness:  This is a 81 y.o. female dx'd with pneumonia 3 wks ago rx'd with Levaquin with improvement but she has not returned to baseline and c/o malaise and felling cold all the time. Denies fever/chills or any specific pain. Seen last week at [facility] where blood work showed GFR 41 (improved from 39 on 7/22) and glucose 129 but otherwise unremarkable. Hx of tick bite given but RMSF and Lyme titres negative as well as UA, CXR, and EKG. Urine cx showed NG. Med list includes Remeron and Prilosec but pt claims she is taking BP meds only. TSH normal in June but not repeated last week. Reports some SOB but no CP or edema. Took some castor oil for constipation with success.  Review of Systems:  Review of Systems  Constitutional: Negative for fever, chills and diaphoresis.  HENT: Negative for ear pain, sore throat and trouble swallowing.   Eyes: Negative for pain.  Respiratory: Negative for cough.   Cardiovascular: Negative for chest pain and leg swelling.  Gastrointestinal: Positive for constipation. Negative for nausea, abdominal pain and diarrhea.  Endocrine: Positive for cold intolerance. Negative for polyuria.  Genitourinary: Negative for dysuria, flank pain, vaginal bleeding and difficulty urinating.  Musculoskeletal: Negative for back pain.  Skin: Negative for rash.  Neurological: Negative for syncope, light-headedness and headaches.  Hematological: Negative for adenopathy.  Psychiatric/Behavioral: Positive for dysphoric mood. Negative for behavioral problems and confusion.    Patient Active Problem List   Diagnosis Date Noted  . CKD (chronic kidney disease) stage 3, GFR 30-59 ml/min 10/20/2014  . Hyperglycemia 10/20/2014  . Anxiety, mild 07/05/2014  . Essential (primary) hypertension 07/05/2014  . Acquired hallux valgus  07/05/2014  . Gastritis, Helicobacter pylori [id number]  . History of colon polyps 07/05/2014  . Arthritis of knee, degenerative 07/05/2014    Prior to Admission medications   Medication Sig Start Date End Date Taking? Authorizing Provider  amLODipine (NORVASC) 10 MG tablet Take 1 tablet by mouth daily. 05/24/14  Yes Historical Provider, MD  fluticasone (FLONASE) 50 MCG/ACT nasal spray Place 2 sprays into both nostrils daily. 09/03/14  Yes Glean Hess, MD  losartan (COZAAR) 50 MG tablet TAKE 1 TABLET BY MOUTH EACH DAY (FOR BLOOD PRESSURE) 09/22/14  Yes Glean Hess, MD    No Known Allergies  Past Surgical History  Procedure Laterality Date  . Partial hysterectomy      Social History  Substance Use Topics  . Smoking status: Never Smoker   . Smokeless tobacco: Not on file  . Alcohol Use: No    Family History  Problem Relation Age of Onset  . Family history unknown: Yes    Medication list has been reviewed and updated.  Physical Examination: BP 160/78 mmHg  Pulse 64  Ht 5\' 5"  (1.651 m)  Wt 179 lb 12.8 oz (81.557 kg)  BMI 29.92 kg/m2  Physical Exam  Constitutional: She appears well-developed and well-nourished. No distress.  HENT:  Mouth/Throat: Oropharynx is clear and moist.  Neck: Neck supple. No thyromegaly present.  Cardiovascular: Normal rate, regular rhythm and normal heart sounds.   Pulmonary/Chest: Effort normal and breath sounds normal. She has no wheezes. She has no rales.  Abdominal: Soft. She exhibits no distension and no mass. There is no tenderness.  Musculoskeletal: She exhibits  no edema.  Lymphadenopathy:    She has no cervical adenopathy.  Neurological: She is alert. Coordination normal.  Skin: Skin is warm and dry. No rash noted. She is not diaphoretic.  Psychiatric: She has a normal mood and affect. Her behavior is normal.  Nursing note and vitals reviewed.   Assessment and Plan:  1. Malaise UA ok, suspect prolonged recovery from  recent pneumonia, will recheck TSH as not done last week - POCT urinalysis dipstick - TSH - Vitamin D (25 hydroxy) - Vitamin 123456 - Basic Metabolic Panel (BMET)  2. Hyperglycemia FSBG 115 today, improved from 8/3 - POCT Glucose (CBG)  3. Essential (primary) hypertension Poor control today but feeling ill, continue usual BP meds  4. Pneumonia, organism unspecified Resolved s/p Levaquin  5. Stage 3 CKD Chronic, recheck BMP   Return in about 4 weeks (around 11/17/2014).  Satira Anis. [facility]  10/20/2014

## 2014-10-21 LAB — BASIC METABOLIC PANEL
BUN/Creatinine Ratio: 16 (ref 11–26)
BUN: 19 mg/dL (ref 8–27)
CALCIUM: 9.3 mg/dL (ref 8.7–10.3)
CO2: 24 mmol/L (ref 18–29)
CREATININE: 1.22 mg/dL — AB (ref 0.57–1.00)
Chloride: 102 mmol/L (ref 97–108)
GFR calc Af Amer: 45 mL/min/{1.73_m2} — ABNORMAL LOW (ref >59)
GFR calc non Af Amer: 39 mL/min/{1.73_m2} — ABNORMAL LOW (ref >59)
Glucose: 98 mg/dL (ref 65–99)
Potassium: 4.3 mmol/L (ref 3.5–5.2)
SODIUM: 142 mmol/L (ref 134–144)

## 2014-10-21 LAB — VITAMIN B12: Vitamin B-12: 680 pg/mL (ref 211–946)

## 2014-10-21 LAB — VITAMIN D 25 HYDROXY (VIT D DEFICIENCY, FRACTURES): Vit D, 25-Hydroxy: 40.2 ng/mL (ref 30.0–100.0)

## 2014-10-21 LAB — TSH: TSH: 0.492 u[IU]/mL (ref 0.450–4.500)

## 2014-10-25 ENCOUNTER — Emergency Department: Payer: Medicare Other

## 2014-10-25 ENCOUNTER — Emergency Department
Admission: EM | Admit: 2014-10-25 | Discharge: 2014-10-25 | Disposition: A | Discharge status: Home / Self Care | Payer: Medicare Other | Attending: Emergency Medicine | Admitting: Emergency Medicine

## 2014-10-25 DIAGNOSIS — Z79899 Other long term (current) drug therapy: Secondary | ICD-10-CM | POA: Diagnosis not present

## 2014-10-25 DIAGNOSIS — K088 Other specified disorders of teeth and supporting structures: Secondary | ICD-10-CM | POA: Insufficient documentation

## 2014-10-25 DIAGNOSIS — I129 Hypertensive chronic kidney disease with stage 1 through stage 4 chronic kidney disease, or unspecified chronic kidney disease: Secondary | ICD-10-CM | POA: Diagnosis not present

## 2014-10-25 DIAGNOSIS — L989 Disorder of the skin and subcutaneous tissue, unspecified: Secondary | ICD-10-CM | POA: Insufficient documentation

## 2014-10-25 DIAGNOSIS — N390 Urinary tract infection, site not specified: Secondary | ICD-10-CM | POA: Insufficient documentation

## 2014-10-25 DIAGNOSIS — F419 Anxiety disorder, unspecified: Secondary | ICD-10-CM | POA: Insufficient documentation

## 2014-10-25 DIAGNOSIS — N183 Chronic kidney disease, stage 3 (moderate): Secondary | ICD-10-CM | POA: Insufficient documentation

## 2014-10-25 DIAGNOSIS — R109 Unspecified abdominal pain: Secondary | ICD-10-CM | POA: Diagnosis present

## 2014-10-25 LAB — CBC
HEMATOCRIT: 36.7 % (ref 35.0–47.0)
HEMOGLOBIN: 12 g/dL (ref 12.0–16.0)
MCH: 31.2 pg (ref 26.0–34.0)
MCHC: 32.7 g/dL (ref 32.0–36.0)
MCV: 95.3 fL (ref 80.0–100.0)
Platelets: 199 10*3/uL (ref 150–440)
RBC: 3.85 MIL/uL (ref 3.80–5.20)
RDW: 13.7 % (ref 11.5–14.5)
WBC: 5.7 10*3/uL (ref 3.6–11.0)

## 2014-10-25 LAB — URINALYSIS COMPLETE WITH MICROSCOPIC (ARMC ONLY)
Bilirubin Urine: NEGATIVE
GLUCOSE, UA: NEGATIVE mg/dL
HGB URINE DIPSTICK: NEGATIVE
NITRITE: NEGATIVE
PH: 6 (ref 5.0–8.0)
PROTEIN: 30 mg/dL — AB
SPECIFIC GRAVITY, URINE: 1.023 (ref 1.005–1.030)

## 2014-10-25 LAB — COMPREHENSIVE METABOLIC PANEL
ALK PHOS: 66 U/L (ref 38–126)
ALT: 8 U/L — AB (ref 14–54)
AST: 19 U/L (ref 15–41)
Albumin: 3.9 g/dL (ref 3.5–5.0)
Anion gap: 9 (ref 5–15)
BILIRUBIN TOTAL: 0.4 mg/dL (ref 0.3–1.2)
BUN: 16 mg/dL (ref 6–20)
CO2: 24 mmol/L (ref 22–32)
Calcium: 8.9 mg/dL (ref 8.9–10.3)
Chloride: 105 mmol/L (ref 101–111)
Creatinine, Ser: 1.15 mg/dL — ABNORMAL HIGH (ref 0.44–1.00)
GFR calc non Af Amer: 41 mL/min — ABNORMAL LOW (ref >60)
GFR, EST AFRICAN AMERICAN: 47 mL/min — AB (ref >60)
Glucose, Bld: 111 mg/dL — ABNORMAL HIGH (ref 65–99)
Potassium: 3.6 mmol/L (ref 3.5–5.1)
SODIUM: 138 mmol/L (ref 135–145)
TOTAL PROTEIN: 6.8 g/dL (ref 6.5–8.1)

## 2014-10-25 LAB — LIPASE, BLOOD: Lipase: 14 U/L — ABNORMAL LOW (ref 22–51)

## 2014-10-25 LAB — TROPONIN I: TROPONIN I: 0.03 ng/mL (ref <0.031)

## 2014-10-25 MED ORDER — ONDANSETRON HCL 4 MG/2ML IJ SOLN
4.0000 mg | Freq: Once | INTRAMUSCULAR | Status: AC
  Administered 2014-10-25: 4 mg via INTRAVENOUS
  Filled 2014-10-25: qty 2

## 2014-10-25 MED ORDER — LORAZEPAM 2 MG/ML IJ SOLN
0.5000 mg | Freq: Once | INTRAMUSCULAR | Status: AC
  Administered 2014-10-25: 0.5 mg via INTRAVENOUS
  Filled 2014-10-25: qty 1

## 2014-10-25 MED ORDER — SODIUM CHLORIDE 0.9 % IV BOLUS (SEPSIS)
500.0000 mL | Freq: Once | INTRAVENOUS | Status: AC
  Administered 2014-10-25: 500 mL via INTRAVENOUS

## 2014-10-25 MED ORDER — ONDANSETRON HCL 4 MG PO TABS: 4.0000 mg | ORAL_TABLET | Freq: Three times a day (TID) | ORAL | Status: AC | PRN

## 2014-10-25 MED ORDER — LORAZEPAM 1 MG PO TABS: 1.0000 mg | ORAL_TABLET | Freq: Two times a day (BID) | ORAL | Status: AC

## 2014-10-25 MED ORDER — CEPHALEXIN 500 MG PO CAPS: 500.0000 mg | ORAL_CAPSULE | Freq: Two times a day (BID) | ORAL | Status: AC

## 2014-10-25 NOTE — ED Provider Notes (Signed)
Time Seen: Approximately ----------------------------------------- 3:26 PM on 10/25/2014 -----------------------------------------    I have reviewed the triage notes  Chief Complaint: Abdominal Pain   History of Present Illness: Katelyn [name] is a 81 y.o. female who presents as a vague historian. Patient states that she started developing some nausea and vomiting last night. According to her daughter who is really the primary historian she's had multiple physical complaints over the last couple weeks and she had a discharge here from a bout of pneumonia. Patient has not had any fever recently she's been complaining of some jaw pain and facial pain and was placed on an antibiotic. The patient had initially described that she hurts all over is difficult to try to note down exactly where her discomfort is coming from. There is been no loose stool, diarrhea, melena, or hematochezia. She states she feels cold and she is lying on a block of ice. There's been no fever that I can ascertain either home and also here in emergency department. The daughter feels this may be some form of anxiety as this seems to be almost a daily issue with migrating discomfort etc.   Past Medical History  Diagnosis Date  . Hypertension     Patient Active Problem List   Diagnosis Date Noted  . CKD (chronic kidney disease) stage 3, GFR 30-59 ml/min 10/20/2014  . Hyperglycemia 10/20/2014  . Anxiety, mild 07/05/2014  . Essential (primary) hypertension 07/05/2014  . Acquired hallux valgus 07/05/2014  . Gastritis, Helicobacter pylori [id number]  . History of colon polyps 07/05/2014  . Arthritis of knee, degenerative 07/05/2014    Past Surgical History  Procedure Laterality Date  . Partial hysterectomy      Past Surgical History  Procedure Laterality Date  . Partial hysterectomy      Current Outpatient Rx  Name  Route  Sig  Dispense  Refill  . amLODipine (NORVASC) 10 MG tablet   Oral   Take 1  tablet by mouth daily.         . fluticasone (FLONASE) 50 MCG/ACT nasal spray   Each Nare   Place 2 sprays into both nostrils daily.   16 g   5   . losartan (COZAAR) 50 MG tablet      TAKE 1 TABLET BY MOUTH EACH DAY (FOR BLOOD PRESSURE)   30 tablet   5     This prescription was filled today. Any refills au ...     Allergies:  Review of patient's allergies indicates no known allergies.  Family History: Family History  Problem Relation Age of Onset  . Family history unknown: Yes    Social History: Social History  Substance Use Topics  . Smoking status: Never Smoker   . Smokeless tobacco: Not on file  . Alcohol Use: No     Review of Systems:   10 point review of systems was performed and was otherwise negative:  Constitutional: No fever Eyes: No visual disturbances ENT: No sore throat, ear pain. She's had some right-sided dental pain without facial swelling. Cardiac: No chest pain Respiratory: No shortness of breath, wheezing, or stridor Abdomen: No abdominal pain, no vomiting, No diarrhea Endocrine: No weight loss, No night sweats Extremities: No peripheral edema, cyanosis Skin: No rashes, easy bruising. Patient's area about a small lesion on the outside surface of the right ankle that has been evaluated by her primary physician and to his not a new complaint. Neurologic: No focal weakness, trouble with speech or swollowing Urologic: No  dysuria, Hematuria, or urinary frequency   Physical Exam:  ED Triage Vitals  Enc Vitals Group     BP 10/25/14 1446 146/88 mmHg     Pulse Rate 10/25/14 1446 70     Resp 10/25/14 1446 18     Temp 10/25/14 1446 98.2 F (36.8 C)     Temp Source 10/25/14 1446 Oral     SpO2 10/25/14 1446 100 %     Weight 10/25/14 1446 176 lb (79.833 kg)     Height 10/25/14 1446 5\' 5"  (1.651 m)     Head Cir --      Peak Flow --      Pain Score 10/25/14 1447 10     Pain Loc --      Pain Edu? --      Excl. in [place]? --     General: Awake ,  Alert , and Oriented times 3; GCS 15 Head: Normal cephalic , atraumatic Eyes: Pupils equal , round, reactive to light Nose/Throat: No nasal drainage, patent upper airway without erythema or exudate.  Neck: Supple, Full range of motion, No anterior adenopathy or palpable thyroid masses Lungs: Clear to ascultation without wheezes , rhonchi, or rales Heart: Regular rate, regular rhythm without murmurs , gallops , or rubs Abdomen: Soft, non tender without rebound, guarding , or rigidity; bowel sounds positive and symmetric in all 4 quadrants. No organomegaly .        Extremities: 2 plus symmetric pulses. No edema, clubbing or cyanosis Neurologic: normal ambulation, Motor symmetric without deficits, sensory intact Skin: warm, dry, no rashes   Labs:   All laboratory work was reviewed including any pertinent negatives or positives listed below:  Labs Reviewed  LIPASE, BLOOD  COMPREHENSIVE METABOLIC PANEL  CBC  URINALYSIS COMPLETEWITH MICROSCOPIC ([place])  TROPONIN I   Laboratory work showed no significant abnormalities. She does have a urinary tract infection and urine culture was added EKG: ED ECG REPORT I, Daymon Larsen, the attending physician, personally viewed and interpreted this ECG.  Date: 10/25/2014 EKG Time: 1525 Rate: 65 Rhythm: normal sinus rhythm QRS Axis: normal Intervals: normal ST/T Wave abnormalities: Nonspecific T wave abnormality Conduction Disutrbances: none Narrative Interpretation: unremarkable    Radiology:  I personally reviewed the radiologic studies    EXAM: DG ABDOMEN ACUTE W/ 1V CHEST  COMPARISON: 10/13/2014.  FINDINGS: Low volume chest. Low volumes accentuate the cardiopericardial silhouette which may be enlarged. Aortic arch atherosclerosis. Basilar atelectasis. No airspace disease. No pleural effusion. Bowel gas pattern is nonobstructive and within normal limits. Sclerosis of the RIGHT bony pelvis is present compatible with Paget's  disease. Trabecular thickening with cortical thickening is present. Metastatic disease could also produce this appearance but the cortical thickening is more characteristic of Paget's disease. The appearance is similar to prior exams.  IMPRESSION: 1. Low volume chest. 2. Normal bowel gas pattern. 3. RIGHT-greater-than-LEFT makes like cysts and sclerosis of the bony pelvis compatible with Paget's disease.  Critical Care:     ED Course: Patient's stay was uneventful she received some small dose of IV Ativan which seemed to assist her with her anxiety. The patient appears to have a urinary tract infection which may be the cause of her feelings of being cold and some of her nausea and vomiting. The patient otherwise had symptomatic improvement with Zofran him be discharged with a prescription for Flomax for the urinary tract infection and also some Zofran for the nausea and vomiting. The family is advised to reviewed with  her primary physician and her feelings of anxiety and whether or not this is new onset dementia or other etiology. She does not appear to have any signs of delirium on today's visit.    Assessment: Altered mental status Anxiety Urinary tract infection   Final Clinical Impression: Anxiety Urinary tract infection Final diagnoses:  None     Plan: Patient was advised to return immediately if condition worsens. Patient was advised to follow up with her primary care physician or other specialized physicians involved and in their current assessment.             Daymon Larsen, MD 10/25/14 2115

## 2014-10-25 NOTE — Discharge Instructions (Signed)
Urinary Tract Infection Urinary tract infections (UTIs) can develop anywhere along your urinary tract. Your urinary tract is your body's drainage system for removing wastes and extra water. Your urinary tract includes two kidneys, two ureters, a bladder, and a urethra. Your kidneys are a pair of bean-shaped organs. Each kidney is about the size of your fist. They are located below your ribs, one on each side of your spine. CAUSES Infections are caused by microbes, which are microscopic organisms, including fungi, viruses, and bacteria. These organisms are so small that they can only be seen through a microscope. Bacteria are the microbes that most commonly cause UTIs. SYMPTOMS  Symptoms of UTIs may vary by age and gender of the patient and by the location of the infection. Symptoms in young women typically include a frequent and intense urge to urinate and a painful, burning feeling in the bladder or urethra during urination. Older women and men are more likely to be tired, shaky, and weak and have muscle aches and abdominal pain. A fever may mean the infection is in your kidneys. Other symptoms of a kidney infection include pain in your back or sides below the ribs, nausea, and vomiting. DIAGNOSIS To diagnose a UTI, your caregiver will ask you about your symptoms. Your caregiver also will ask to provide a urine sample. The urine sample will be tested for bacteria and white blood cells. White blood cells are made by your body to help fight infection. TREATMENT  Typically, UTIs can be treated with medication. Because most UTIs are caused by a bacterial infection, they usually can be treated with the use of antibiotics. The choice of antibiotic and length of treatment depend on your symptoms and the type of bacteria causing your infection. HOME CARE INSTRUCTIONS  If you were prescribed antibiotics, take them exactly as your caregiver instructs you. Finish the medication even if you feel better after you  have only taken some of the medication.  Drink enough water and fluids to keep your urine clear or pale yellow.  Avoid caffeine, tea, and carbonated beverages. They tend to irritate your bladder.  Empty your bladder often. Avoid holding urine for long periods of time.  Empty your bladder before and after sexual intercourse.  After a bowel movement, women should cleanse from front to back. Use each tissue only once. SEEK MEDICAL CARE IF:   You have back pain.  You develop a fever.  Your symptoms do not begin to resolve within 3 days. SEEK IMMEDIATE MEDICAL CARE IF:   You have severe back pain or lower abdominal pain.  You develop chills.  You have nausea or vomiting.  You have continued burning or discomfort with urination. MAKE SURE YOU:   Understand these instructions.  Will watch your condition.  Will get help right away if you are not doing well or get worse. Document Released: 12/06/2004 Document Revised: 08/28/2011 Document Reviewed: 04/06/2011 [facility] Patient Information 2015 [place], [place]. This information is not intended to replace advice given to you by your health care provider. Make sure you discuss any questions you have with your health care provider.  Please return immediately if condition worsens. Please contact her primary physician or the physician you were given for referral. If you have any specialist physicians involved in her treatment and plan please also contact them. Thank you for using [facility]. Please follow up with your primary physician especially with the other concerns of anxiety and intermittent physical complaints. There is a urine culture  that's pending to make sure the antibiotic is the right choice.

## 2014-10-25 NOTE — ED Notes (Signed)
Pt has multiple complaints and is poor historian.  C/o abdominal pain that started "when they took my pills away" but she is unable to say when it started or what pills.  C/o right leg pain and head pain.  Had jaw pain with EMS but now c/o right face pain.

## 2014-11-13 ENCOUNTER — Ambulatory Visit
Admission: EM | Admit: 2014-11-13 | Discharge: 2014-11-13 | Disposition: A | Discharge status: Home / Self Care | Payer: Medicare Other | Attending: Internal Medicine | Admitting: Internal Medicine

## 2014-11-13 DIAGNOSIS — K644 Residual hemorrhoidal skin tags: Secondary | ICD-10-CM

## 2014-11-13 DIAGNOSIS — K648 Other hemorrhoids: Secondary | ICD-10-CM

## 2014-11-13 LAB — OCCULT BLOOD X 1 CARD TO LAB, STOOL: FECAL OCCULT BLD: NEGATIVE

## 2014-11-13 NOTE — ED Notes (Signed)
Patient complains of red blood in stool in toliet after bowel movement this morning. She states that she hadn't had a bowel movement in 3-4 days. She states that hasn't noticed any blood in diaper or with wiping. Patient is a poor historian.

## 2014-11-13 NOTE — ED Provider Notes (Signed)
CSN: [id number]     Arrival date & time 11/13/14  0825 History   First MD Initiated Contact with Patient 11/13/14 [phone]     Chief Complaint  Patient presents with  . Blood In Stools   (Consider location/radiation/quality/duration/timing/severity/associated sxs/prior Treatment) HPI  81 yo alert, interactive F brought by daughter. Daughter reports seeing blood in the toilet. There is a second elderly  sister in the home but both deny  Any awareness of blood. Ms Doritha reports a hard stool yesterday.   Tries to eat prunes on occasion. She is not aware of any blood and did not notice any on her tissue Past Medical History  Diagnosis Date  . Hypertension    Past Surgical History  Procedure Laterality Date  . Partial hysterectomy     Family History  Problem Relation Age of Onset  . Family history unknown: Yes   Social History  Substance Use Topics  . Smoking status: Never Smoker   . Smokeless tobacco: None  . Alcohol Use: No   OB History    No data available     Review of Systems   Constitutional -afebrile Eyes-denies visual changes ENT- normal voice,denies sore throat CV-denies chest pain Resp-denies SOB GI- negative for nausea,vomiting, diarrhea GU- negative for dysuria MSK- negative for back pain, ambulatory Skin- denies acute changes Neuro- negative headache,focal weakness or numbness, early dementia reported.by daughter    Allergies  Review of patient's allergies indicates no known allergies.  Home Medications   Prior to Admission medications   Medication Sig Start Date End Date Taking? Authorizing Provider  amLODipine (NORVASC) 10 MG tablet Take 10 mg by mouth daily.   Yes Historical Provider, MD  LORazepam (ATIVAN) 1 MG tablet Take 1 tablet (1 mg total) by mouth 2 (two) times daily. 10/25/14 11/01/15 Yes Daymon Larsen, MD  losartan (COZAAR) 50 MG tablet Take 50 mg by mouth daily.   Yes Historical Provider, MD  ondansetron (ZOFRAN) 4 MG tablet Take 1 tablet  (4 mg total) by mouth every 8 (eight) hours as needed for nausea or vomiting. 10/25/14 10/29/15 Yes Daymon Larsen, MD  fluticasone (FLONASE) 50 MCG/ACT nasal spray Place 2 sprays into both nostrils daily. 09/03/14   Glean Hess, MD  mirtazapine (REMERON) 15 MG tablet Take 15 mg by mouth at bedtime.    Historical Provider, MD  omeprazole (PRILOSEC) 20 MG capsule Take 20 mg by mouth daily.    Historical Provider, MD   Meds Ordered and Administered this Visit  Medications - No data to display  BP 145/69 mmHg  Pulse 89  Temp(Src) 97.9 F (36.6 C) (Tympanic)  Resp 16  Ht 5\' 5"  (1.651 m)  Wt 180 lb (81.647 kg)  BMI 29.95 kg/m2  SpO2 99% No data found.   Physical Exam   Constitutional -alert ,well appearing elderly female, in no acute distress- mildly distracted about visit-but very aware that daughter "made" her come when she thought it unnecessary Head-atraumatic, normocephalic Eyes- conjunctiva normal, EOMI ,conjugate gaze Nose- no congestion or rhinorrhea Mouth/throat- mucous membranes moist , Neck- supple without glandular enlargement CV- regular rate, grossly normal heart sounds,  Resp-no distress, normal respiratory effort,clear to auscultation bilaterally GI- soft,non-tender,no distention GU- EGBUS -WNL for PMP  F. Most notable are large soft external hemorrhoids. Not thrombosed.No active bleeding noted.  Relaxed vaginal outlet, mucosa healthy, no trauma noted. Cuff is supple and well supported. She reports a "partial hysterectomy" but doesn' t know what parts are present or missing.  Visual of cuff reveals a nipple of tissue in.right apex that is either a stitch defect at cuff closure or a remnant cervix. Difficult to establish.On bimanual there is no evidence of pelvic mass, no tenderss to palpation or manipulation cuff. Attention to Rectal-can tolerate unidigital rectal exam and denies pain. No fissure orf mass can be identified. Moderate amount of large stool in rectum.   Guiac negative MSK- no tender, normal ROM, all extremities, ambulatory, self-care Neuro- normal speech and language, no gross focal neurological deficit appreciated, some gait instability, uses cane or walker Skin-warm,dry ,intact; no rash noted Psych-mood and affect grossly normal; speech and behavior grossly normal  ED Course  Procedures (including critical care time)  Labs Review Labs Reviewed  OCCULT BLOOD X 1 CARD TO LAB, STOOL   Results for orders placed or performed during the hospital encounter of 11/13/14  Occult blood card to lab, stool RN will collect  Result Value Ref Range   Fecal Occult Bld NEGATIVE NEGATIVE    Imaging Review No results found.     MDM   1. Hemorrhoids, external    Diagnosis and treatment discussed. Labs negative. Hemorrhoids -pain-bleed-encourage careful avoidance of constipation. Add back fluids and fiber- consider breakfast prunes daily Add methylcellulose bulking fiber. . Questions fielded, expectations and recommendations reviewed. Patient expresses understanding. Will return to [facility] with questions, concern or exacerbation.      Jan Fireman, PA-C 11/16/14 2233

## 2014-11-16 NOTE — Discharge Instructions (Signed)
Take 2 prunes every morning- may use 3 a day if they don't cause loose bowels  Proctofoam HC cream to anus with difficult BMs  Glycerin suppositories as needed  Hemorrhoids Hemorrhoids are swollen veins around the rectum or anus. There are two types of hemorrhoids:   Internal hemorrhoids. These occur in the veins just inside the rectum. They may poke through to the outside and become irritated and painful.  External hemorrhoids. These occur in the veins outside the anus and can be felt as a painful swelling or hard lump near the anus. CAUSES  Pregnancy.   Obesity.   Constipation or diarrhea.   Straining to have a bowel movement.   Sitting for long periods on the toilet.  Heavy lifting or other activity that caused you to strain.  Anal intercourse. SYMPTOMS   Pain.   Anal itching or irritation.   Rectal bleeding.   Fecal leakage.   Anal swelling.   One or more lumps around the anus.  DIAGNOSIS  Your caregiver may be able to diagnose hemorrhoids by visual examination. Other examinations or tests that may be performed include:   Examination of the rectal area with a gloved hand (digital rectal exam).   Examination of anal canal using a small tube (scope).   A blood test if you have lost a significant amount of blood.  A test to look inside the colon (sigmoidoscopy or colonoscopy). TREATMENT Most hemorrhoids can be treated at home. However, if symptoms do not seem to be getting better or if you have a lot of rectal bleeding, your caregiver may perform a procedure to help make the hemorrhoids get smaller or remove them completely. Possible treatments include:   Placing a rubber band at the base of the hemorrhoid to cut off the circulation (rubber band ligation).   Injecting a chemical to shrink the hemorrhoid (sclerotherapy).   Using a tool to burn the hemorrhoid (infrared light therapy).   Surgically removing the hemorrhoid (hemorrhoidectomy).    Stapling the hemorrhoid to block blood flow to the tissue (hemorrhoid stapling).  HOME CARE INSTRUCTIONS   Eat foods with fiber, such as whole grains, beans, nuts, fruits, and vegetables. Ask your doctor about taking products with added fiber in them (fibersupplements).  Increase fluid intake. Drink enough water and fluids to keep your urine clear or pale yellow.   Exercise regularly.   Go to the bathroom when you have the urge to have a bowel movement. Do not wait.   Avoid straining to have bowel movements.   Keep the anal area dry and clean. Use wet toilet paper or moist towelettes after a bowel movement.   Medicated creams and suppositories may be used or applied as directed.   Only take over-the-counter or prescription medicines as directed by your caregiver.   Take warm sitz baths for 15-20 minutes, 3-4 times a day to ease pain and discomfort.   Place ice packs on the hemorrhoids if they are tender and swollen. Using ice packs between sitz baths may be helpful.   Put ice in a plastic bag.   Place a towel between your skin and the bag.   Leave the ice on for 15-20 minutes, 3-4 times a day.   Do not use a donut-shaped pillow or sit on the toilet for long periods. This increases blood pooling and pain.  SEEK MEDICAL CARE IF:  You have increasing pain and swelling that is not controlled by treatment or medicine.  You have uncontrolled bleeding.  You have difficulty or you are unable to have a bowel movement.  You have pain or inflammation outside the area of the hemorrhoids. MAKE SURE YOU:  Understand these instructions.  Will watch your condition.  Will get help right away if you are not doing well or get worse. Document Released: 02/24/2000 Document Revised: 02/13/2012 Document Reviewed: 01/01/2012 [facility] Patient Information 2015 [place], [place]. This information is not intended to replace advice given to you by your health care provider. Make  sure you discuss any questions you have with your health care provider.  Gastrointestinal Bleeding Gastrointestinal bleeding is bleeding somewhere along the path that food travels through the body (digestive tract). This path is anywhere between the mouth and the opening of the butt (anus). You may have blood in your throw up (vomit) or in your poop (stools). If there is a lot of bleeding, you may need to stay in the hospital. [place]  Only take medicine as told by your doctor.  Eat foods with fiber such as whole grains, fruits, and vegetables. You can also try eating 1 to 3 prunes a day.  Drink enough fluids to keep your pee (urine) clear or pale yellow. GET HELP RIGHT AWAY IF:   Your bleeding gets worse.  You feel dizzy, weak, or you pass out (faint).  You have bad cramps in your back or belly (abdomen).  You have large blood clumps (clots) in your poop.  Your problems are getting worse. MAKE SURE YOU:   Understand these instructions.  Will watch your condition.  Will get help right away if you are not doing well or get worse. Document Released: 12/06/2007 Document Revised: 02/13/2012 Document Reviewed: 02/05/2011 [facility] Patient Information 2015 [place], [place]. This information is not intended to replace advice given to you by your health care provider. Make sure you discuss any questions you have with your health care provider.

## 2014-12-09 ENCOUNTER — Other Ambulatory Visit: Payer: Self-pay | Admitting: Internal Medicine

## 2014-12-23 ENCOUNTER — Inpatient Hospital Stay
Admission: EM | Admit: 2014-12-23 | Discharge: 2014-12-27 | DRG: 072 | Disposition: A | Discharge status: Home / Self Care | Payer: Medicare (Managed Care) | Attending: Internal Medicine | Admitting: Internal Medicine

## 2014-12-23 ENCOUNTER — Inpatient Hospital Stay
Admit: 2014-12-23 | Discharge: 2014-12-23 | Disposition: A | Discharge status: Home / Self Care | Payer: Medicare (Managed Care) | Attending: Internal Medicine | Admitting: Internal Medicine

## 2014-12-23 ENCOUNTER — Emergency Department: Payer: Medicare (Managed Care)

## 2014-12-23 ENCOUNTER — Encounter: Payer: Self-pay | Admitting: Emergency Medicine

## 2014-12-23 ENCOUNTER — Inpatient Hospital Stay: Payer: Medicare (Managed Care)

## 2014-12-23 DIAGNOSIS — W19XXXA Unspecified fall, initial encounter: Secondary | ICD-10-CM | POA: Diagnosis present

## 2014-12-23 DIAGNOSIS — Z79899 Other long term (current) drug therapy: Secondary | ICD-10-CM

## 2014-12-23 DIAGNOSIS — M1711 Unilateral primary osteoarthritis, right knee: Secondary | ICD-10-CM | POA: Diagnosis present

## 2014-12-23 DIAGNOSIS — Z7982 Long term (current) use of aspirin: Secondary | ICD-10-CM

## 2014-12-23 DIAGNOSIS — M1000 Idiopathic gout, unspecified site: Secondary | ICD-10-CM | POA: Diagnosis present

## 2014-12-23 DIAGNOSIS — Z90711 Acquired absence of uterus with remaining cervical stump: Secondary | ICD-10-CM

## 2014-12-23 DIAGNOSIS — Z833 Family history of diabetes mellitus: Secondary | ICD-10-CM | POA: Diagnosis not present

## 2014-12-23 DIAGNOSIS — I739 Peripheral vascular disease, unspecified: Secondary | ICD-10-CM | POA: Diagnosis present

## 2014-12-23 DIAGNOSIS — I639 Cerebral infarction, unspecified: Secondary | ICD-10-CM | POA: Diagnosis not present

## 2014-12-23 DIAGNOSIS — Z801 Family history of malignant neoplasm of trachea, bronchus and lung: Secondary | ICD-10-CM

## 2014-12-23 DIAGNOSIS — I63311 Cerebral infarction due to thrombosis of right middle cerebral artery: Secondary | ICD-10-CM | POA: Diagnosis not present

## 2014-12-23 DIAGNOSIS — I129 Hypertensive chronic kidney disease with stage 1 through stage 4 chronic kidney disease, or unspecified chronic kidney disease: Secondary | ICD-10-CM | POA: Diagnosis present

## 2014-12-23 DIAGNOSIS — N183 Chronic kidney disease, stage 3 (moderate): Secondary | ICD-10-CM | POA: Diagnosis present

## 2014-12-23 DIAGNOSIS — M25561 Pain in right knee: Secondary | ICD-10-CM

## 2014-12-23 DIAGNOSIS — R000 Tachycardia, unspecified: Secondary | ICD-10-CM | POA: Diagnosis present

## 2014-12-23 DIAGNOSIS — R739 Hyperglycemia, unspecified: Secondary | ICD-10-CM | POA: Diagnosis present

## 2014-12-23 DIAGNOSIS — I998 Other disorder of circulatory system: Secondary | ICD-10-CM | POA: Diagnosis present

## 2014-12-23 DIAGNOSIS — M25569 Pain in unspecified knee: Secondary | ICD-10-CM

## 2014-12-23 DIAGNOSIS — I672 Cerebral atherosclerosis: Secondary | ICD-10-CM | POA: Diagnosis not present

## 2014-12-23 HISTORY — DX: Gout, unspecified: M10.9

## 2014-12-23 HISTORY — DX: Chronic kidney disease, unspecified: N18.9

## 2014-12-23 HISTORY — DX: Osteitis deformans of unspecified bone: M88.9

## 2014-12-23 LAB — TROPONIN I
Troponin I: <0.03 ng/mL (ref <0.031)
Troponin I: <0.03 ng/mL (ref <0.031)

## 2014-12-23 LAB — COMPREHENSIVE METABOLIC PANEL
ALBUMIN: 3.5 g/dL (ref 3.5–5.0)
ALT: 9 U/L — AB (ref 14–54)
ANION GAP: 9 (ref 5–15)
AST: 19 U/L (ref 15–41)
Alkaline Phosphatase: 56 U/L (ref 38–126)
BILIRUBIN TOTAL: 0.6 mg/dL (ref 0.3–1.2)
BUN: 21 mg/dL — AB (ref 6–20)
CALCIUM: 8.9 mg/dL (ref 8.9–10.3)
CO2: 23 mmol/L (ref 22–32)
Chloride: 108 mmol/L (ref 101–111)
Creatinine, Ser: 1.18 mg/dL — ABNORMAL HIGH (ref 0.44–1.00)
GFR calc Af Amer: 46 mL/min — ABNORMAL LOW (ref >60)
GFR calc non Af Amer: 40 mL/min — ABNORMAL LOW (ref >60)
GLUCOSE: 153 mg/dL — AB (ref 65–99)
Potassium: 3.6 mmol/L (ref 3.5–5.1)
Sodium: 140 mmol/L (ref 135–145)
Total Protein: 6.3 g/dL — ABNORMAL LOW (ref 6.5–8.1)

## 2014-12-23 LAB — LACTIC ACID, PLASMA
Lactic Acid, Venous: 1.9 mmol/L (ref 0.5–2.0)
Lactic Acid, Venous: 1.7 mmol/L (ref 0.5–2.0)

## 2014-12-23 LAB — HEMOGLOBIN A1C: Hgb A1c MFr Bld: 5.7 % (ref 4.0–6.0)

## 2014-12-23 LAB — CBC
HEMATOCRIT: 36.1 % (ref 35.0–47.0)
HEMOGLOBIN: 12.1 g/dL (ref 12.0–16.0)
MCH: 32 pg (ref 26.0–34.0)
MCHC: 33.4 g/dL (ref 32.0–36.0)
MCV: 95.8 fL (ref 80.0–100.0)
Platelets: 165 10*3/uL (ref 150–440)
RBC: 3.77 MIL/uL — AB (ref 3.80–5.20)
RDW: 14.4 % (ref 11.5–14.5)
WBC: 7.2 10*3/uL (ref 3.6–11.0)

## 2014-12-23 LAB — TSH: TSH: 1.849 u[IU]/mL (ref 0.350–4.500)

## 2014-12-23 MED ORDER — MORPHINE SULFATE (PF) 2 MG/ML IV SOLN
2.0000 mg | Freq: Once | INTRAVENOUS | Status: AC
  Administered 2014-12-23: 2 mg via INTRAVENOUS
  Filled 2014-12-23: qty 1

## 2014-12-23 MED ORDER — ACETAMINOPHEN 325 MG PO TABS
650.0000 mg | ORAL_TABLET | Freq: Four times a day (QID) | ORAL | Status: DC | PRN
  Administered 2014-12-23 – 2014-12-26 (×4): 650 mg via ORAL
  Filled 2014-12-23 (×4): qty 2

## 2014-12-23 MED ORDER — SODIUM CHLORIDE 0.9 % IJ SOLN
3.0000 mL | Freq: Two times a day (BID) | INTRAMUSCULAR | Status: DC
  Administered 2014-12-23 – 2014-12-27 (×8): 3 mL via INTRAVENOUS

## 2014-12-23 MED ORDER — ACETAMINOPHEN 650 MG RE SUPP: 650.0000 mg | Freq: Four times a day (QID) | RECTAL | Status: DC | PRN

## 2014-12-23 MED ORDER — HYDROCORTISONE 2.5 % RE CREA: 1.0000 "application " | TOPICAL_CREAM | Freq: Two times a day (BID) | RECTAL | Status: DC | PRN

## 2014-12-23 MED ORDER — FLUTICASONE PROPIONATE 50 MCG/ACT NA SUSP
2.0000 | Freq: Every day | NASAL | Status: DC
  Administered 2014-12-23 – 2014-12-27 (×4): 2 via NASAL
  Filled 2014-12-23: qty 16

## 2014-12-23 MED ORDER — LORAZEPAM 1 MG PO TABS
1.0000 mg | ORAL_TABLET | Freq: Two times a day (BID) | ORAL | Status: DC
  Administered 2014-12-23 – 2014-12-27 (×7): 1 mg via ORAL
  Filled 2014-12-23 (×7): qty 1

## 2014-12-23 MED ORDER — POLYETHYLENE GLYCOL 3350 17 G PO PACK
17.0000 g | PACK | Freq: Every day | ORAL | Status: DC
  Administered 2014-12-23 – 2014-12-26 (×4): 17 g via ORAL
  Filled 2014-12-23 (×5): qty 1

## 2014-12-23 MED ORDER — OXYCODONE-ACETAMINOPHEN 5-325 MG PO TABS
1.0000 | ORAL_TABLET | Freq: Four times a day (QID) | ORAL | Status: DC | PRN
  Administered 2014-12-23 – 2014-12-27 (×4): 1 via ORAL
  Filled 2014-12-23 (×5): qty 1

## 2014-12-23 MED ORDER — STROKE: EARLY STAGES OF RECOVERY BOOK
Freq: Once | Status: AC
  Administered 2014-12-23

## 2014-12-23 MED ORDER — ASPIRIN 81 MG PO CHEW
324.0000 mg | CHEWABLE_TABLET | Freq: Once | ORAL | Status: AC
  Administered 2014-12-23: 324 mg via ORAL
  Filled 2014-12-23: qty 4

## 2014-12-23 MED ORDER — ASPIRIN 81 MG PO CHEW
81.0000 mg | CHEWABLE_TABLET | Freq: Every day | ORAL | Status: DC
  Administered 2014-12-23 – 2014-12-27 (×5): 81 mg via ORAL
  Filled 2014-12-23 (×5): qty 1

## 2014-12-23 MED ORDER — HEPARIN SODIUM (PORCINE) 5000 UNIT/ML IJ SOLN
5000.0000 [IU] | Freq: Three times a day (TID) | INTRAMUSCULAR | Status: DC
  Administered 2014-12-23 – 2014-12-27 (×10): 5000 [IU] via SUBCUTANEOUS
  Filled 2014-12-23 (×11): qty 1

## 2014-12-23 NOTE — Progress Notes (Signed)
*  PRELIMINARY RESULTS* Echocardiogram 2D Echocardiogram has been performed.  Katelyn [name] 12/23/2014, 4:34 PM

## 2014-12-23 NOTE — Progress Notes (Signed)
On call MD notified pt remains in extreme discomfort despite all efforts in making pt comfortable w/ pain medications, heating pad therapy, & repositioning. MD acknowledged, does not want to keep giving pain medications at this time. Expressed concern for gout as pt has history of & recently ate fresh pork. Telephone order for uric acid & to call MD if elevated. Daughters at bedside continue to express concern and frustration. Will continue to assess.

## 2014-12-23 NOTE — ED Provider Notes (Signed)
[facility] Emergency Department Provider Note REMINDER - THIS NOTE IS NOT A FINAL MEDICAL RECORD UNTIL IT IS SIGNED. UNTIL THEN, THE CONTENT BELOW MAY REFLECT INFORMATION FROM A DOCUMENTATION TEMPLATE, NOT THE ACTUAL PATIENT VISIT. ____________________________________________  Time seen: Approximately 9:43 AM  I have reviewed the triage vital signs and the nursing notes.   HISTORY  Chief Complaint Fall and Leg Pain    HPI Katelyn [name] is a 81 y.o. female history of hypertension. The patient reports that she felt fine last night, she woke up this morning and was having pain in her right leg and numbness from about the right knee down to the right foot. When she attempted to stand she reports that her foot was weak and sort of gave out on her this morning, and she was unable to stand upon it. EMS was called for severe foot pain, and numbness in the right leg which last known well was last evening prior to going to bed.  Patient denies any history of blood clots. She states that her right foot feels weak, numb, and was very painful until paramedics gave her pain medicine which has helped relieve her pain.  No weakness or numbness in the face, no trouble speaking, no numbness or weakness in the arms.  No abdominal or back pain  Past Medical History  Diagnosis Date  . Hypertension     Patient Active Problem List   Diagnosis Date Noted  . CKD (chronic kidney disease) stage 3, GFR 30-59 ml/min 10/20/2014  . Hyperglycemia 10/20/2014  . Anxiety, mild 07/05/2014  . Essential (primary) hypertension 07/05/2014  . Acquired hallux valgus 07/05/2014  . Gastritis, Helicobacter pylori [id number]  . History of colon polyps 07/05/2014  . Arthritis of knee, degenerative 07/05/2014    Past Surgical History  Procedure Laterality Date  . Partial hysterectomy      Current Outpatient Rx  Name  Route  Sig  Dispense  Refill  . amLODipine (NORVASC) 10 MG  tablet      TAKE ONE TABLET EVERY DAY (FOR BLOOD PRESSURE)   30 tablet   5     This prescription was filled today(12/09/2014). Any ...   . hydrocortisone (ANUSOL-HC) 2.5 % rectal cream   Rectal   Place 1 application rectally 2 (two) times daily as needed for hemorrhoids.         Marland Kitchen losartan (COZAAR) 50 MG tablet   Oral   Take 50 mg by mouth daily.         . polyethylene glycol (MIRALAX / GLYCOLAX) packet   Oral   Take 17 g by mouth daily.         . fluticasone (FLONASE) 50 MCG/ACT nasal spray   Each Nare   Place 2 sprays into both nostrils daily. Patient not taking: Reported on 12/23/2014   16 g   5   . LORazepam (ATIVAN) 1 MG tablet   Oral   Take 1 tablet (1 mg total) by mouth 2 (two) times daily. Patient not taking: Reported on 12/23/2014   14 tablet   0   . ondansetron (ZOFRAN) 4 MG tablet   Oral   Take 1 tablet (4 mg total) by mouth every 8 (eight) hours as needed for nausea or vomiting. Patient not taking: Reported on 12/23/2014   20 tablet   1     Allergies Review of patient's allergies indicates no known allergies.  Family History  Problem Relation Age of Onset  . Family  history unknown: Yes    Social History Social History  Substance Use Topics  . Smoking status: Never Smoker   . Smokeless tobacco: None  . Alcohol Use: No    Review of Systems Constitutional: No fever/chills Eyes: No visual changes. ENT: No sore throat. Cardiovascular: Denies chest pain. Respiratory: Denies shortness of breath. Does report she had pneumonia about one month ago which has resolved. Gastrointestinal: No abdominal pain.  No nausea, no vomiting.  No diarrhea.  No constipation. Genitourinary: Negative for dysuria. Musculoskeletal: Negative for back pain. Skin: Negative for rash. Neurological: Negative for headaches, focal weakness or numbness.  10-point ROS otherwise negative.  ____________________________________________   PHYSICAL EXAM:  VITAL  SIGNS: ED Triage Vitals  Enc Vitals Group     BP --      Pulse --      Resp --      Temp --      Temp src --      SpO2 --      Weight --      Height --      Head Cir --      Peak Flow --      Pain Score --      Pain Loc --      Pain Edu? --      Excl. in [place]? --    Constitutional: Alert and oriented. Well appearing and in no acute distress. Eyes: Conjunctivae are normal. PERRL. EOMI. Head: Atraumatic. Nose: No congestion/rhinnorhea. Mouth/Throat: Mucous membranes are moist.  Oropharynx non-erythematous. Neck: No stridor.   Cardiovascular: Normal rate, regular rhythm. Grossly normal heart sounds.  Good peripheral circulation. Respiratory: Normal respiratory effort.  No retractions. Lungs CTAB. Gastrointestinal: Soft and nontender. No distention. No abdominal bruits. No CVA tenderness. Musculoskeletal: No lower extremity tenderness nor edema.  No joint effusions. The patient does not have any tenderness with range of motion of the right lower extremity. The patient does report that the right lower extremity feels numb and does have decreased sensation from approximately the right knee down into the foot. She does have a dopplerable popliteal artery on the right, there is no Doppler signal from the dorsalis pedis or posterior tibial pulses and capillary refill is slowed on the right. The patient has a palpable dorsalis pedis with normal capillary refill in the left foot. Neurologic:  Normal speech and language. No gross focal neurologic deficits are appreciated except for weakness in raising the right foot and also numbness and tingling with poor ability to wiggle toes on the right leg compared to the left . Skin:  Skin is warm, dry and intact except is slightly cool over the right foot when compared to the left. No rash noted. Psychiatric: Mood and affect are normal. Speech and behavior are normal.  ____________________________________________   LABS (all labs ordered are listed, but  only abnormal results are displayed)  Labs Reviewed  CBC - Abnormal; Notable for the following:    RBC 3.77 (*)    All other components within normal limits  COMPREHENSIVE METABOLIC PANEL - Abnormal; Notable for the following:    Glucose, Bld 153 (*)    BUN 21 (*)    Creatinine, Ser 1.18 (*)    Total Protein 6.3 (*)    ALT 9 (*)    GFR calc non Af Amer 40 (*)    GFR calc Af Amer 46 (*)    All other components within normal limits  LACTIC ACID, PLASMA  TROPONIN I  LACTIC ACID, PLASMA   ____________________________________________  EKG  Reviewed and interpreted by me EKG time 9:45 AM Sinus rhythm Significant T wave abnormality seen in precordial leads and inferior leads. There is artifact present, no evidence of ST elevation. Compared with the patient's previous EKG, it appears that these T-wave abnormalities are chronic  QT 475 PR 16 Heart rate 60   ____________________________________________  RADIOLOGY  CT Head Wo Contrast (Final result) Result time: 12/23/14 10:08:32   Final result by Rad Results In Interface (12/23/14 10:08:32)   Narrative:   CLINICAL DATA: Pain following fall. Right lower extremity pain and numbness  EXAM: CT HEAD WITHOUT CONTRAST  TECHNIQUE: Contiguous axial images were obtained from the base of the skull through the vertex without intravenous contrast.  COMPARISON: March 02, 2014  FINDINGS: There is stable age related volume loss. There is no intracranial mass, hemorrhage, extra-axial fluid collection, or midline shift. In comparison with the prior study, there is a focal somewhat wedge-shaped area of decreased attenuation the left temporal -parietal junction, suspicious for a focal acute infarct. This finding is best appreciated on axial slices 19 and 20, series 2. Elsewhere there is slight small vessel disease in the centra semiovale bilaterally. Bony calvarium appears intact. Visualized mastoid air cells are  clear.  IMPRESSION: Findings felt to represent a small acute infarct of the left peripheral temporal -parietal junction. Elsewhere there is age related volume loss with slight periventricular small vessel disease. No hemorrhage or apparent mass.    ____________________________________________   PROCEDURES  Procedure(s) performed: None  Critical Care performed: Yes, see critical care note(s) CRITICAL CARE Performed by: Delman Kitten   Total critical care time: 35  Critical care time was exclusive of separately billable procedures and treating other patients.  Critical care was necessary to treat or prevent imminent or life-threatening deterioration.  Critical care was time spent personally by me on the following activities: development of treatment plan with patient and/or surrogate as well as nursing, discussions with consultants, evaluation of patient's response to treatment, examination of patient, obtaining history from patient or surrogate, ordering and performing treatments and interventions, ordering and review of laboratory studies, ordering and review of radiographic studies, pulse oximetry and re-evaluation of patient's condition.  Patient presents with acute neurologic abnormality. This required emergent evaluation to rule out possibility of an acute ischemic stroke or ischemic limb. ____________________________________________   INITIAL IMPRESSION / ASSESSMENT AND PLAN / ED COURSE  Pertinent labs & imaging results that were available during my care of the patient were reviewed by me and considered in my medical decision making (see chart for details).  She presents for right lower leg pain and numbness. It appears to be somewhat acute in onset, with significant change from when she was last normal prior to midnight. Not a TPA candidate due to time of onset last evening or during the night. Awoke with symptoms  Differential diagnosis includes acute arterial embolic  event including stroke, or distal ischemia.  ----------------------------------------- 9:47 AM on 12/23/2014 -----------------------------------------  Consult placed with Dr. Lucky Cowboy of vascular surgery at this time for stat consultation regarding possible ischemic right foot.  ----------------------------------------- 12:06 PM on 12/23/2014 -----------------------------------------  Patient remained stable. Continues to have persistent right leg numbness, though no duskiness or obvious cyanosis. After review of CT head eye feel that her right leg weakness is most likely due to an acute temporoparietal infarct. Patient seen by Dr. Barkley Bruns who is in agreement. Because of the difficulty palpating pulses and obtaining dopplerable signals  in the right lower extremity I am still awaiting consultation from vascular Dr. Lucky Cowboy which was placed urgently proximally 2 hours ago. I re-paged him.  We'll admit the patient to the medical service. Asa given. Admitting to Dr. Volanda Napoleon with vascular surgery consult pending. ____________________________________________   FINAL CLINICAL IMPRESSION(S) / ED DIAGNOSES  Final diagnoses:  Acute ischemic stroke (HCC)      Delman Kitten, MD 12/23/14 1210

## 2014-12-23 NOTE — Evaluation (Signed)
Physical Therapy Evaluation Patient Details Name: Chlo… Bleak MRN: [id number] DOB: Jul 20, 1923 Today's Date: 12/23/2014   History of Present Illness  81 yo female with onset RLE numbness and now dx'd with L peripheral temporo-parietal junction infarct, with L IC artery 50-69% stenosed.  RLE 8/10 pain at eval.   Clinical Impression  Pt was seen for assessment of her gait and was not able walk safely unless cued for controlling RW and to observe the surfaces for sitting, reaching back.  Pt was distracted and did not recall all instructions, which also may be reduced by her CVA.  Pt is planning to go home with family assist but her daughter will need pt to be more independent and aware of safety to transition.  Very unaware of her RLE deficits.    Follow Up Recommendations SNF    Equipment Recommendations  Rolling walker with 5" wheels    Recommendations for Other Services Rehab consult     Precautions / Restrictions Precautions Precautions: Fall Restrictions Weight Bearing Restrictions: No      Mobility  Bed Mobility Overal bed mobility: Needs Assistance Bed Mobility: Supine to Sit;Sit to Supine     Supine to sit: Min assist Sit to supine: Min assist   General bed mobility comments: mainly assisted under trunk and used bed pad to avoid hurting RLE  Transfers Overall transfer level: Needs assistance Equipment used: Rolling walker (2 wheeled) Transfers: Sit to/from [facility] Sit to Stand: Min guard;Min assist Stand pivot transfers: Min guard;Min assist       General transfer comment: reminders to reach back as pt does not recall the instructions  Ambulation/Gait Ambulation/Gait assistance: Min assist Ambulation Distance (Feet): 60 Feet Assistive device: Rolling walker (2 wheeled);1 person hand held assist Gait Pattern/deviations: Step-to pattern;Decreased dorsiflexion - right;Shuffle;Wide base of support;Trunk flexed Gait velocity:  reduced Gait velocity interpretation: Below normal speed for age/gender General Gait Details: tends to let walker get away from her, not focused on safety with navigation or turns  Stairs            Wheelchair Mobility    Modified Rankin (Stroke Patients Only)       Balance Overall balance assessment: Needs assistance Sitting-balance support: Feet supported Sitting balance-Leahy Scale: Fair   Postural control: Posterior lean Standing balance support: Bilateral upper extremity supported Standing balance-Leahy Scale: Poor Standing balance comment: needs supervision for safety with hand placement and equipment                             Pertinent Vitals/Pain Pain Assessment: 0-10 Pain Score: 8  Pain Location: R lower leg Pain Descriptors / Indicators: Aching Pain Intervention(s): Limited activity within patient's tolerance;Monitored during session;Repositioned    Home Living Family/patient expects to be discharged to:: Private residence Living Arrangements: Children (PACE program) Available Help at Discharge: Family;Available 24 hours/day Type of Home: House Home Access: Stairs to enter Entrance Stairs-Rails: Right;Left;Can reach both (2 steps no rails alternate entrance) Entrance Stairs-Number of Steps: 4 Home Layout: One level Home Equipment: Walker - standard;Cane - single point;Bedside commode;Shower seat;Wheelchair - manual (manual chair is older but usable)      Prior Function Level of Independence: Independent with assistive device(s)               Hand Dominance        Extremity/Trunk Assessment   Upper Extremity Assessment: Overall WFL for tasks assessed  Lower Extremity Assessment: RLE deficits/detail RLE Deficits / Details: weaker RLE with dragging of leg with gait    Cervical / Trunk Assessment: Kyphotic  Communication   Communication: HOH  Cognition Arousal/Alertness: Awake/alert Behavior During Therapy: WFL  for tasks assessed/performed Overall Cognitive Status: Impaired/Different from baseline Area of Impairment: Safety/judgement;Problem solving;Following commands     Memory: Decreased short-term memory Following Commands: Follows one step commands inconsistently Safety/Judgement: Decreased awareness of safety;Decreased awareness of deficits   Problem Solving: Slow processing;Requires verbal cues General Comments: pt tends to set walker aside but needs the support for RLE    General Comments General comments (skin integrity, edema, etc.): Pt is home with family and will need some more intensive therapy due to geography of her house unless she can climb steps to enter by dc from hospital    Exercises        Assessment/Plan    PT Assessment Patient needs continued PT services  PT Diagnosis Difficulty walking;Hemiplegia dominant side   PT Problem List Decreased strength;Decreased range of motion;Decreased activity tolerance;Decreased balance;Decreased mobility;Decreased coordination;Decreased cognition;Decreased knowledge of use of DME;Decreased safety awareness;Decreased knowledge of precautions;Cardiopulmonary status limiting activity;Pain  PT Treatment Interventions DME instruction;Gait training;Stair training;Functional mobility training;Therapeutic activities;Therapeutic exercise;Balance training;Neuromuscular re-education;Cognitive remediation;Patient/family education   PT Goals (Current goals can be found in the Care Plan section) Acute Rehab PT Goals Patient Stated Goal: to get up to use potty chair PT Goal Formulation: With patient/family Time For Goal Achievement: 01/06/15 Potential to Achieve Goals: Good    Frequency 7X/week   Barriers to discharge Inaccessible home environment steps to enter house    Co-evaluation               End of Session Equipment Utilized During Treatment: Gait belt Activity Tolerance: Patient tolerated treatment well;Patient limited by  fatigue Patient left: in bed;with call bell/phone within reach;with bed alarm set;with family/visitor present Nurse Communication: Mobility status;Other (comment) (pt asked for paper brief )         Time: [id number] PT Time Calculation (min) (ACUTE ONLY): 25 min   Charges:   PT Evaluation $Initial PT Evaluation Tier I: 1 Procedure PT Treatments $Gait Training: 8-22 mins   PT G Codes:        Ramond Dial 2014/12/25, 6:53 PM   Mee Hives, PT MS Acute Rehab Dept. Number: [facility] [id number] and [place] [phone]

## 2014-12-23 NOTE — ED Notes (Signed)
Vascular at bedside

## 2014-12-23 NOTE — H&P (Signed)
[facility] [place] NAME: Katelyn [name]    MR#:  [id number]  DATE OF BIRTH:  [birth year]  DATE OF ADMISSION:  12/23/2014  PRIMARY CARE PHYSICIAN: Pcp Not In System   REQUESTING/REFERRING PHYSICIAN: Dr Jacqualine Code  CHIEF COMPLAINT:   Chief Complaint  Patient presents with  . Fall  . Leg Pain    HISTORY OF PRESENT ILLNESS:  Katelyn [name]  is a 81 y.o. female with a known history of hypertension, gout, chronic kidney disease stage III, Paget's disease presents today after a fall. She reports that when she got up from sleeping this morning she tried to stand and her legs gave out. She feels that her right leg is numb from the knee down. [facility] and was evaluated for these symptoms, her primary care provider was unable to palpate a pulse on the right leg and she was referred to the emergency room for further evaluation. On EGD evaluation CT scan of the head shows a new infarct of the left peripheral temporal parietal junction. We are unable to find right dorsalis pedis pulse with Doppler. She is being admitted for workup of stroke and potential ischemic right foot.  PAST MEDICAL HISTORY:   Past Medical History  Diagnosis Date  . Hypertension    Gout, Paget's disease, chronic kidney disease stage III  PAST SURGICAL HISTORY:   Past Surgical History  Procedure Laterality Date  . Partial hysterectomy      SOCIAL HISTORY:   Social History  Substance Use Topics  . Smoking status: Never Smoker   . Smokeless tobacco: Not on file  . Alcohol Use: No    FAMILY HISTORY:   Family History  Problem Relation Age of Onset  . Family history unknown: Yes   Diabetes in her mother, colon and lung cancer in 2 sisters  DRUG ALLERGIES:  No Known Allergies  REVIEW OF SYSTEMS:   Review of Systems  Constitutional: Negative for fever, chills, weight loss and malaise/fatigue.  HENT: Negative for  congestion and hearing loss.   Eyes: Negative for blurred vision and pain.  Respiratory: Negative for cough, hemoptysis, sputum production, shortness of breath and stridor.   Cardiovascular: Negative for chest pain, palpitations, orthopnea and leg swelling.  Gastrointestinal: Negative for nausea, vomiting, abdominal pain, diarrhea, constipation and blood in stool.  Genitourinary: Negative for dysuria and frequency.  Musculoskeletal: Positive for falls. Negative for myalgias, back pain, joint pain and neck pain.  Skin: Negative for rash.  Neurological: Negative for focal weakness, loss of consciousness and headaches.  Endo/Heme/Allergies: Does not bruise/bleed easily.  Psychiatric/Behavioral: Negative for depression and hallucinations. The patient is not nervous/anxious.     MEDICATIONS AT HOME:   Prior to Admission medications   Medication Sig Start Date End Date Taking? Authorizing Provider  amLODipine (NORVASC) 10 MG tablet TAKE ONE TABLET EVERY DAY (FOR BLOOD PRESSURE) 12/09/14  Yes Glean Hess, MD  hydrocortisone (ANUSOL-HC) 2.5 % rectal cream Place 1 application rectally 2 (two) times daily as needed for hemorrhoids.   Yes Historical Provider, MD  losartan (COZAAR) 50 MG tablet Take 50 mg by mouth daily.   Yes Historical Provider, MD  polyethylene glycol (MIRALAX / GLYCOLAX) packet Take 17 g by mouth daily.   Yes Historical Provider, MD  fluticasone (FLONASE) 50 MCG/ACT nasal spray Place 2 sprays into both nostrils daily. Patient not taking: Reported on 12/23/2014 09/03/14   Glean Hess, MD  LORazepam (ATIVAN) 1 MG tablet Take 1 tablet (1 mg total) by mouth 2 (two) times daily. Patient not taking: Reported on 12/23/2014 10/25/14 11/01/15  Daymon Larsen, MD  ondansetron (ZOFRAN) 4 MG tablet Take 1 tablet (4 mg total) by mouth every 8 (eight) hours as needed for nausea or vomiting. Patient not taking: Reported on 12/23/2014 10/25/14 10/29/15  Daymon Larsen, MD      VITAL  SIGNS:  Blood pressure 137/70, pulse 54, temperature 97.4 F (36.3 C), temperature source Oral, resp. rate 20, height 5\' 5"  (1.651 m), weight 81.194 kg (179 lb), SpO2 100 %.  PHYSICAL EXAMINATION:  GENERAL:  81 y.o.-year-old patient lying in the bed with no acute distress.  EYES: Pupils equal, round, reactive to light and accommodation. No scleral icterus. Extraocular muscles intact.  HEENT: Head atraumatic, normocephalic. Oropharynx and nasopharynx clear.  NECK:  Supple, no jugular venous distention. No thyroid enlargement, no tenderness.  LUNGS: Normal breath sounds bilaterally, no wheezing, rales,rhonchi or crepitation. No use of accessory muscles of respiration.  CARDIOVASCULAR: S1, S2 normal. No murmurs, rubs, or gallops.  ABDOMEN: Soft, nontender, nondistended. Bowel sounds present. No organomegaly or mass.  EXTREMITIES: No pedal edema, cyanosis, or clubbing. Unable to palpate pulses in the right foot, left foot pulses diminished but palpable NEUROLOGIC: Cranial nerves II through XII are intact. Muscle strength 5/5 in all extremities. Sensation intact. Gait not checked.  PSYCHIATRIC: The patient is alert and oriented x 3. Calm SKIN: No obvious rash, lesion, or ulcer.   LABORATORY PANEL:   CBC  Recent Labs Lab 12/23/14 0949  WBC 7.2  HGB 12.1  HCT 36.1  PLT 165   ------------------------------------------------------------------------------------------------------------------  Chemistries   Recent Labs Lab 12/23/14 0949  NA 140  K 3.6  CL 108  CO2 23  GLUCOSE 153*  BUN 21*  CREATININE 1.18*  CALCIUM 8.9  AST 19  ALT 9*  ALKPHOS 56  BILITOT 0.6   ------------------------------------------------------------------------------------------------------------------  Cardiac Enzymes  Recent Labs Lab 12/23/14 1321  TROPONINI <0.03   ------------------------------------------------------------------------------------------------------------------  RADIOLOGY:   Ct Head Wo Contrast  12/23/2014  CLINICAL DATA:  Pain following fall. Right lower extremity pain and numbness EXAM: CT HEAD WITHOUT CONTRAST TECHNIQUE: Contiguous axial images were obtained from the base of the skull through the vertex without intravenous contrast. COMPARISON:  March 02, 2014 FINDINGS: There is stable age related volume loss. There is no intracranial mass, hemorrhage, extra-axial fluid collection, or midline shift. In comparison with the prior study, there is a focal somewhat wedge-shaped area of decreased attenuation the left temporal -parietal junction, suspicious for a focal acute infarct. This finding is best appreciated on axial slices 19 and 20, series 2. Elsewhere there is slight small vessel disease in the centra semiovale bilaterally. Bony calvarium appears intact. Visualized mastoid air cells are clear. IMPRESSION: Findings felt to represent a small acute infarct of the left peripheral temporal -parietal junction. Elsewhere there is age related volume loss with slight periventricular small vessel disease. No hemorrhage or apparent mass. Electronically Signed   By: Lowella Grip III M.D.   On: 12/23/2014 10:08    EKG:   Orders placed or performed during the hospital encounter of 12/23/14  . ED EKG  . ED EKG  . EKG 12-Lead  . EKG 12-Lead    IMPRESSION AND PLAN:   #1 acute CVA: Will admit to telemetry. Will obtain 2-D echocardiogram Dopplers and cycle cardiac enzymes. Will ask for physical therapy consultation. Her neurologic examination is nonfocal today.  I have started aspirin 81 mg daily. Will obtain MRI of the brain. Neurology consultation is appreciated.  #2 possibly ischemic right lower extremity: Acute numbness, cold to touch, unable to find pulse with Doppler. Will ask for vascular surgery consultation.  #3 hypertension: Blood pressure currently controlled. Will hold antihypertensives in the setting of acute stroke with goal systolic blood pressure between  160 and 180  #4 chronic kidney disease stage III: Baseline creatinine 1.15, close to baseline at this time. Stable  #5 hyperglycemia: Check hemoglobin A1c. Presenting blood sugar 153.  CODE STATUS: Full this was discussed with the patient and her daughter Letta Median at the bedside on admission.   TOTAL TIME TAKING CARE OF THIS PATIENT: 45 minutes.  Greater than 50% of time spent in coordination of care and counseling. care plan discussed with the patient family and the emergency room physician.   Myrtis Ser M.D on 12/23/2014 at 3:45 PM  Between 7am to 6pm - Pager - [phone]  After 6pm go to [email or web address] - password [email or web address]  Office  [phone]  CC: Primary care physician; Pcp Not In System

## 2014-12-23 NOTE — Progress Notes (Signed)
Pt is screaming in pain r/t right leg knee and below pain radiating up and down stating "the bone is hurting." Pain 10/10 On call MD notified telephone order for Percocet. Percocet given at 1930 w/ scheduled Ativan. 1 hour later pain 10/5. Then back up to 10/10. On call MD notified again, Morphine x1 given at 2114 Kpad heating applied. No relief after 36min. Will continue to assess & call MD for further guidance.

## 2014-12-23 NOTE — Consult Note (Signed)
[facility] Vascular Consult Note  MRN : [id number]  Katelyn [name] is a 81 y.o. (08-Oct-1923) female who presents with chief complaint of  Chief Complaint  Patient presents with  . Fall  . Leg Pain  .  History of Present Illness: Patient awoke this morning with pain and coolness in her right foot. She said it basically went numb on her and she fell. The pain is much better now she reports. Prior to this episode, she was not having any significant claudication, ischemic rest pain, and has no ulceration or infection. She reports no recent injury or trauma to the area. On examination by the emergency room physicians, no pedal pulses were palpable in the right foot. She was also found to have a stroke affecting her right side. We're consult for further evaluation and determination on her peripheral vascular disease.  Current Facility-Administered Medications  Medication Dose Route Frequency Provider Last Rate Last Dose  . acetaminophen (TYLENOL) tablet 650 mg  650 mg Oral Q6H PRN Aldean Jewett, MD   650 mg at 12/23/14 1706   Or  . acetaminophen (TYLENOL) suppository 650 mg  650 mg Rectal Q6H PRN Aldean Jewett, MD      . aspirin chewable tablet 81 mg  81 mg Oral Daily Aldean Jewett, MD   81 mg at 12/23/14 1300  . fluticasone (FLONASE) 50 MCG/ACT nasal spray 2 spray  2 spray Each Nare Daily Aldean Jewett, MD   2 spray at 12/23/14 1707  . heparin injection 5,000 Units  5,000 Units Subcutaneous 3 times per day Aldean Jewett, MD   5,000 Units at 12/23/14 1707  . hydrocortisone (ANUSOL-HC) 2.5 % rectal cream 1 application  1 application Rectal BID PRN Aldean Jewett, MD      . LORazepam (ATIVAN) tablet 1 mg  1 mg Oral BID Aldean Jewett, MD      . polyethylene glycol (MIRALAX / GLYCOLAX) packet 17 g  17 g Oral Daily Aldean Jewett, MD   17 g at 12/23/14 1707  . sodium chloride 0.9 % injection 3 mL  3 mL Intravenous Q12H Aldean Jewett,  MD   3 mL at 12/23/14 1710    Past Medical History  Diagnosis Date  . Hypertension   . Chronic kidney disease   . Paget's disease of bone   . Gout     Past Surgical History  Procedure Laterality Date  . Partial hysterectomy      Social History Social History  Substance Use Topics  . Smoking status: Never Smoker   . Smokeless tobacco: None  . Alcohol Use: No  No IVDU  Family History Family History  Problem Relation Age of Onset  . Diabetes Mother   . Cancer Sister   . Cancer Sister   . Cancer Sister   no bleeding or clotting disorders  No Known Allergies   REVIEW OF SYSTEMS (Negative unless checked)  Constitutional: [] Weight loss  [] Fever  [] Chills Cardiac: [] Chest pain   [] Chest pressure   [] Palpitations   [] Shortness of breath when laying flat   [] Shortness of breath at rest   [] Shortness of breath with exertion. Vascular:  [] Pain in legs with walking   [] Pain in legs at rest   [] Pain in legs when laying flat   [] Claudication   [] Pain in feet when walking  [x] Pain in feet at rest  [] Pain in feet when laying flat   [] History of DVT   []   Phlebitis   [] Swelling in legs   [] Varicose veins   [] Non-healing ulcers Pulmonary:   [] Uses home oxygen   [] Productive cough   [] Hemoptysis   [] Wheeze  [] COPD   [] Asthma Neurologic:  [] Dizziness  [] Blackouts   [] Seizures   [x] History of stroke   [] History of TIA  [] Aphasia   [] Temporary blindness   [] Dysphagia   [] Weakness or numbness in arms   [] Weakness or numbness in legs Musculoskeletal:  [] Arthritis   [] Joint swelling   [] Joint pain   [] Low back pain Hematologic:  [] Easy bruising  [] Easy bleeding   [] Hypercoagulable state   [] Anemic  [] Hepatitis Gastrointestinal:  [] Blood in stool   [] Vomiting blood  [] Gastroesophageal reflux/heartburn   [] Difficulty swallowing. Genitourinary:  [x] Chronic kidney disease   [] Difficult urination  [] Frequent urination  [] Burning with urination   [] Blood in urine Skin:  [] Rashes   [] Ulcers    [] Wounds Psychological:  [] History of anxiety   []  History of major depression.  Physical Examination  Filed Vitals:   12/23/14 1200 12/23/14 1230 12/23/14 1330 12/23/14 1409  BP: 153/63 172/57 152/64 137/70  Pulse: 51 65 54 54  Temp:    97.4 F (36.3 C)  TempSrc:    Oral  Resp: 25 16 20 20   Height:      Weight:      SpO2: 99% 95% 100% 100%   Body mass index is 29.79 kg/(m^2). Gen:  WD/WN, NAD Head: [place]/AT, No temporalis wasting. Prominent temp pulse not noted. Ear/Nose/Throat: Hearing grossly intact, nares w/o erythema or drainage, oropharynx w/o Erythema/Exudate Eyes: PERRLA, EOMI.  Neck: Supple, no nuchal rigidity.  No JVD.  Pulmonary:  Good air movement, clear to auscultation bilaterally.  Cardiac: irregularly irregular Vascular: right foot is cool compared to the left Vessel Right Left  Radial Palpable Palpable  Ulnar Palpable Palpable  Brachial Palpable Palpable  Carotid Palpable, without bruit Palpable, without bruit  Aorta Not palpable N/A  Femoral Palpable Palpable  Popliteal Not Palpable Not Palpable  PT Not Palpable  Palpable  DP Not palpable Weakly palpable   Gastrointestinal: soft, non-tender/non-distended. No guarding/reflex. No masses, surgical incisions, or scars. Musculoskeletal: M/S 5/5 throughout.  No obvious deformity LOCKING DISEASE AND HER Neurologic: CN 2-12 intact. Decreased sensation in right foot and lower leg.  Symmetrical.  Speech is fluent. Motor exam as listed above. Psychiatric: Judgment intact, Mood & affect appropriate for pt's clinical situation. Dermatologic: No rashes or ulcers noted.  No cellulitis or open wounds. Lymph : No Cervical, Axillary, or Inguinal lymphadenopathy.       CBC Lab Results  Component Value Date   WBC 7.2 12/23/2014   HGB 12.1 12/23/2014   HCT 36.1 12/23/2014   MCV 95.8 12/23/2014   PLT 165 12/23/2014    BMET    Component Value Date/Time   NA 140 12/23/2014 0949   NA 142 10/20/2014 1007   NA 139  06/06/2014 1002   K 3.6 12/23/2014 0949   K 4.0 06/06/2014 1002   CL 108 12/23/2014 0949   CL 105 06/06/2014 1002   CO2 23 12/23/2014 0949   CO2 24 06/06/2014 1002   GLUCOSE 153* 12/23/2014 0949   GLUCOSE 98 10/20/2014 1007   GLUCOSE 170* 06/06/2014 1002   BUN 21* 12/23/2014 0949   BUN 19 10/20/2014 1007   BUN 34* 06/06/2014 1002   CREATININE 1.18* 12/23/2014 0949   CREATININE 1.44* 06/06/2014 1002   CREATININE 1.2* 08/31/2013   CALCIUM 8.9 12/23/2014 0949   CALCIUM 9.0 06/06/2014 1002  GFRNONAA 40* 12/23/2014 0949   GFRNONAA 32* 06/06/2014 1002   GFRNONAA 36* 03/02/2014 0658   GFRAA 46* 12/23/2014 0949   GFRAA 37* 06/06/2014 1002   GFRAA 44* 03/02/2014 [id number]   Estimated Creatinine Clearance: 34 mL/min (by C-G formula based on Cr of 1.18).  COAG No results found for: INR, PROTIME  Radiology Ct Head Wo Contrast  12/23/2014  CLINICAL DATA:  Pain following fall. Right lower extremity pain and numbness EXAM: CT HEAD WITHOUT CONTRAST TECHNIQUE: Contiguous axial images were obtained from the base of the skull through the vertex without intravenous contrast. COMPARISON:  March 02, 2014 FINDINGS: There is stable age related volume loss. There is no intracranial mass, hemorrhage, extra-axial fluid collection, or midline shift. In comparison with the prior study, there is a focal somewhat wedge-shaped area of decreased attenuation the left temporal -parietal junction, suspicious for a focal acute infarct. This finding is best appreciated on axial slices 19 and 20, series 2. Elsewhere there is slight small vessel disease in the centra semiovale bilaterally. Bony calvarium appears intact. Visualized mastoid air cells are clear. IMPRESSION: Findings felt to represent a small acute infarct of the left peripheral temporal -parietal junction. Elsewhere there is age related volume loss with slight periventricular small vessel disease. No hemorrhage or apparent mass. Electronically Signed   By:  Lowella Grip III M.D.   On: 12/23/2014 10:08   US Carotid Bilateral  12/23/2014  CLINICAL DATA:  CVA.  History of syncopal episode and hypertension. EXAM: BILATERAL CAROTID DUPLEX ULTRASOUND TECHNIQUE: Pearline Cables scale imaging, color Doppler and duplex ultrasound were performed of bilateral carotid and vertebral arteries in the neck. COMPARISON:  Head CT - 12/23/2014 FINDINGS: Criteria: Quantification of carotid stenosis is based on velocity parameters that correlate the residual internal carotid diameter with NASCET-based stenosis levels, using the diameter of the distal internal carotid lumen as the denominator for stenosis measurement. The following velocity measurements were obtained: RIGHT ICA:  93/22 cm/sec CCA:  123XX123 cm/sec SYSTOLIC ICA/CCA RATIO:  1.1 DIASTOLIC ICA/CCA RATIO:  1.6 ECA:  60 cm/sec LEFT ICA:  181/28 cm/sec CCA:  0000000 cm/sec SYSTOLIC ICA/CCA RATIO:  1.7 DIASTOLIC ICA/CCA RATIO:  1.2 ECA:  177 cm/sec RIGHT CAROTID ARTERY: There is a minimal amount of circumferential intimal thickening throughout the right common carotid artery (representative images 8, 12 and 16). There is a minimal amount intimal wall thickening within the right carotid bulb (image 20), extending to involve the origin and proximal aspect the right internal carotid artery (image 27), not resulting in elevated peak systolic velocities within the interrogated course of the right internal carotid artery to suggest a hemodynamically significant stenosis. RIGHT VERTEBRAL ARTERY:  Antegrade flow LEFT CAROTID ARTERY: There is a moderate amount of eccentric mixed echogenic plaque within the proximal and mid aspects of the left common carotid artery (representative images 39 and 43). There is a large amount of eccentric mixed echogenic partially shadowing plaque within the left carotid bulb (image 51), extending to involve the origin of the left internal carotid artery (image 59), resulting in elevated peak systolic velocities  within the proximal aspect of the left internal carotid artery (greatest acquired peak systolic velocity within the proximal left ICA measures 181 cm/sec - image 61). LEFT VERTEBRAL ARTERY:  Antegrade flow Incidental note is made of an apparent transient cardiac arrhythmia (image 37). IMPRESSION: 1. Large amount of left-sided atherosclerotic plaque results in elevated peak systolic velocities within the left internal carotid artery compatible with the 50-69% luminal narrowing range.  Further evaluation with CTA could be performed as clinically indicated. 2. Minimal amount of right-sided intimal wall thickening, not resulting in a hemodynamically significant stenosis. 3. Apparent transient cardiac arrhythmia. Further evaluation with ECG monitoring could be performed as clinically indicated. Electronically Signed   By: Sandi Mariscal M.D.   On: 12/23/2014 16:25      Assessment/Plan 1.  Right foot pain.  Unclear origin, but stroke is most likely cause.   2.  Right foot ischemia. This would appear more chronic in nature to me and not an acute finding, but acute on chronic ischemia is also possible. Her pedal pulses are very poor on the right and mildly reduced on the left. I would continue his stroke workup and consider dual antiplatelet therapy as tolerated. Her foot is not acutely threatened and does not need an immediate angiogram, but this could be required in the future. 3. Chronic kidney disease. Stage IV. Will increase the risk of angiogram.   DEW,JASON, MD  12/23/2014 5:28 PM

## 2014-12-23 NOTE — Consult Note (Signed)
CC: RLE weakness   HPI: Katelyn [name] is an 81 y.o. female with history of hypertension. The patient reports that she felt fine last night, she woke up this morning and was having pain in her right leg and numbness from about the right knee down to the right foot. When she attempted to stand she reports that her foot was weak and sort of gave out on her this morning, and she was unable to stand upon it. No weakness on the RUE or face.   CTH left peripheral temporal -parietal junction.  Past Medical History  Diagnosis Date  . Hypertension     Past Surgical History  Procedure Laterality Date  . Partial hysterectomy      Family History  Problem Relation Age of Onset  . Family history unknown: Yes    Social History:  reports that she has never smoked. She does not have any smokeless tobacco history on file. She reports that she does not drink alcohol or use illicit drugs.  No Known Allergies  Medications: I have reviewed the patient's current medications.   Physical Examination: Blood pressure 152/64, pulse 54, temperature 96.3 F (35.7 C), temperature source Axillary, resp. rate 20, height 5\' 5"  (1.651 m), weight 81.194 kg (179 lb), SpO2 100 %.    Neurological Examination Mental Status: Alert, oriented, thought content appropriate.  Speech fluent without evidence of aphasia.  Able to follow 3 step commands without difficulty. Cranial Nerves: II: Discs flat bilaterally; Visual fields grossly normal, pupils equal, round, reactive to light and accommodation III,IV, VI: ptosis not present, extra-ocular motions intact bilaterally V,VII: smile symmetric, facial light touch sensation normal bilaterally VIII: hearing normal bilaterally IX,X: gag reflex present XI: bilateral shoulder shrug XII: midline tongue extension Motor: Right : Upper extremity   4+/5    Left:     Upper extremity   3/5  Lower extremity   4+/5     Lower extremity   4+/5 Tone and bulk:normal tone  throughout; no atrophy noted Sensory: Pinprick and light touch intact throughout, bilaterally Deep Tendon Reflexes: 1+ and symmetric throughout Plantars: Right: downgoing   Left: downgoing Cerebellar: normal finger-to-nose, normal rapid alternating movements and normal heel-to-shin test Gait: not tested     Laboratory Studies:   Basic Metabolic Panel:  Recent Labs Lab 12/23/14 0949  NA 140  K 3.6  CL 108  CO2 23  GLUCOSE 153*  BUN 21*  CREATININE 1.18*  CALCIUM 8.9    Liver Function Tests:  Recent Labs Lab 12/23/14 0949  AST 19  ALT 9*  ALKPHOS 56  BILITOT 0.6  PROT 6.3*  ALBUMIN 3.5   No results for input(s): LIPASE, AMYLASE in the last 168 hours. No results for input(s): AMMONIA in the last 168 hours.  CBC:  Recent Labs Lab 12/23/14 0949  WBC 7.2  HGB 12.1  HCT 36.1  MCV 95.8  PLT 165    Cardiac Enzymes:  Recent Labs Lab 12/23/14 0949  TROPONINI <0.03    BNP: Invalid input(s): POCBNP  CBG: No results for input(s): GLUCAP in the last 168 hours.  Microbiology: Results for orders placed or performed during the hospital encounter of 10/13/14  Urine culture     Status: None   Collection Time: 10/13/14  2:15 PM  Result Value Ref Range Status   Specimen Description URINE, CLEAN CATCH  Final   Special Requests NONE  Final   Culture INSIGNIFICANT GROWTH  Final   Report Status 10/14/2014 FINAL  Final  Coagulation Studies: No results for input(s): LABPROT, INR in the last 72 hours.  Urinalysis: No results for input(s): COLORURINE, LABSPEC, PHURINE, GLUCOSEU, HGBUR, BILIRUBINUR, KETONESUR, PROTEINUR, UROBILINOGEN, NITRITE, LEUKOCYTESUR in the last 168 hours.  Invalid input(s): APPERANCEUR  Lipid Panel:  No results found for: CHOL, TRIG, HDL, CHOLHDL, VLDL, LDLCALC  HgbA1C: No results found for: HGBA1C  Urine Drug Screen:  No results found for: LABOPIA, COCAINSCRNUR, LABBENZ, AMPHETMU, THCU, LABBARB  Alcohol Level: No results for  input(s): ETH in the last 168 hours.  Other results: EKG: normal EKG, normal sinus rhythm, unchanged from previous tracings.  Imaging: Ct Head Wo Contrast  12/23/2014  CLINICAL DATA:  Pain following fall. Right lower extremity pain and numbness EXAM: CT HEAD WITHOUT CONTRAST TECHNIQUE: Contiguous axial images were obtained from the base of the skull through the vertex without intravenous contrast. COMPARISON:  March 02, 2014 FINDINGS: There is stable age related volume loss. There is no intracranial mass, hemorrhage, extra-axial fluid collection, or midline shift. In comparison with the prior study, there is a focal somewhat wedge-shaped area of decreased attenuation the left temporal -parietal junction, suspicious for a focal acute infarct. This finding is best appreciated on axial slices 19 and 20, series 2. Elsewhere there is slight small vessel disease in the centra semiovale bilaterally. Bony calvarium appears intact. Visualized mastoid air cells are clear. IMPRESSION: Findings felt to represent a small acute infarct of the left peripheral temporal -parietal junction. Elsewhere there is age related volume loss with slight periventricular small vessel disease. No hemorrhage or apparent mass. Electronically Signed   By: Lowella Grip III M.D.   On: 12/23/2014 10:08     Assessment/Plan:  81 y.o. female with history of hypertension. The patient reports that she felt fine two days ago, she woke up and was having pain in her right leg and numbness from about the right knee down to the right foot. When she attempted to stand she reports that her foot was weak and sort of gave out on her this morning, and she was unable to stand upon it. No weakness on the RUE or face.   CTH left peripheral temporal -parietal junction.  Vascular consult to makes sure this is not ischemic foot, but I do think this could be stroke related given the [facility] findings.  Was not on ASA, please start on ASA 81mg .   Pt/ot MRI brain and MRA head D/w pt's daughter at bedside.   12/23/2014, 1:47 PM

## 2014-12-23 NOTE — ED Notes (Signed)
Pt via ems from [facility]; fell from standing this morning. C/o numbness and pain in right leg. EMS reports lack of pulses, even with doppler. Pt alert & oriented with NAD.

## 2014-12-24 ENCOUNTER — Inpatient Hospital Stay: Payer: Medicare (Managed Care)

## 2014-12-24 DIAGNOSIS — M25561 Pain in right knee: Secondary | ICD-10-CM

## 2014-12-24 LAB — URIC ACID: Uric Acid, Serum: 7.4 mg/dL — ABNORMAL HIGH (ref 2.3–6.6)

## 2014-12-24 LAB — BASIC METABOLIC PANEL
ANION GAP: 5 (ref 5–15)
BUN: 18 mg/dL (ref 6–20)
CALCIUM: 8.9 mg/dL (ref 8.9–10.3)
CO2: 26 mmol/L (ref 22–32)
Chloride: 109 mmol/L (ref 101–111)
Creatinine, Ser: 0.95 mg/dL (ref 0.44–1.00)
GFR calc Af Amer: 60 mL/min — ABNORMAL LOW (ref >60)
GFR, EST NON AFRICAN AMERICAN: 52 mL/min — AB (ref >60)
Glucose, Bld: 118 mg/dL — ABNORMAL HIGH (ref 65–99)
POTASSIUM: 3.8 mmol/L (ref 3.5–5.1)
SODIUM: 140 mmol/L (ref 135–145)

## 2014-12-24 LAB — LIPID PANEL
CHOL/HDL RATIO: 4 ratio
CHOLESTEROL: 160 mg/dL (ref 0–200)
HDL: 40 mg/dL — AB (ref >40)
LDL Cholesterol: 104 mg/dL — ABNORMAL HIGH (ref 0–99)
TRIGLYCERIDES: 82 mg/dL (ref <150)
VLDL: 16 mg/dL (ref 0–40)

## 2014-12-24 LAB — CBC
HCT: 34.6 % — ABNORMAL LOW (ref 35.0–47.0)
Hemoglobin: 11.7 g/dL — ABNORMAL LOW (ref 12.0–16.0)
MCH: 32 pg (ref 26.0–34.0)
MCHC: 33.7 g/dL (ref 32.0–36.0)
MCV: 95.1 fL (ref 80.0–100.0)
PLATELETS: 152 10*3/uL (ref 150–440)
RBC: 3.64 MIL/uL — AB (ref 3.80–5.20)
RDW: 14.1 % (ref 11.5–14.5)
WBC: 6.9 10*3/uL (ref 3.6–11.0)

## 2014-12-24 LAB — TROPONIN I: TROPONIN I: 0.03 ng/mL (ref <0.031)

## 2014-12-24 MED ORDER — LOSARTAN POTASSIUM 50 MG PO TABS
50.0000 mg | ORAL_TABLET | Freq: Every day | ORAL | Status: DC
  Administered 2014-12-24 – 2014-12-27 (×4): 50 mg via ORAL
  Filled 2014-12-24 (×4): qty 1

## 2014-12-24 MED ORDER — CLOPIDOGREL BISULFATE 75 MG PO TABS
75.0000 mg | ORAL_TABLET | Freq: Every day | ORAL | Status: DC
Start: 2014-12-24 — End: 2014-12-27
  Administered 2014-12-24 – 2014-12-27 (×4): 75 mg via ORAL
  Filled 2014-12-24 (×4): qty 1

## 2014-12-24 MED ORDER — INDOMETHACIN 50 MG PO CAPS
50.0000 mg | ORAL_CAPSULE | Freq: Three times a day (TID) | ORAL | Status: DC
  Administered 2014-12-24 – 2014-12-27 (×11): 50 mg via ORAL
  Filled 2014-12-24 (×11): qty 1

## 2014-12-24 MED ORDER — METHYLPREDNISOLONE SODIUM SUCC 125 MG IJ SOLR
60.0000 mg | INTRAMUSCULAR | Status: DC
  Administered 2014-12-24 – 2014-12-25 (×2): 60 mg via INTRAVENOUS
  Filled 2014-12-24 (×2): qty 2

## 2014-12-24 MED ORDER — AMLODIPINE BESYLATE 10 MG PO TABS
10.0000 mg | ORAL_TABLET | Freq: Every day | ORAL | Status: DC
  Administered 2014-12-24 – 2014-12-26 (×3): 10 mg via ORAL
  Filled 2014-12-24 (×3): qty 1

## 2014-12-24 MED ORDER — GADOBENATE DIMEGLUMINE 529 MG/ML IV SOLN
20.0000 mL | Freq: Once | INTRAVENOUS | Status: AC | PRN
  Administered 2014-12-24: 09:00:00 16 mL via INTRAVENOUS

## 2014-12-24 MED ORDER — PRAVASTATIN SODIUM 20 MG PO TABS
20.0000 mg | ORAL_TABLET | Freq: Every day | ORAL | Status: DC
  Administered 2014-12-25 – 2014-12-27 (×3): 20 mg via ORAL
  Filled 2014-12-24 (×3): qty 1

## 2014-12-24 NOTE — Progress Notes (Signed)
[place] [facility] [place] NAME: Katelyn [name]    MR#:  [id number]  DATE OF BIRTH:  [birth year]  SUBJECTIVE:  CHIEF COMPLAINT:    REVIEW OF SYSTEMS:  CONSTITUTIONAL: No fever, fatigue or weakness.  EYES: No blurred or double vision.  EARS, NOSE, AND THROAT: No tinnitus or ear pain.  RESPIRATORY: No cough, shortness of breath, wheezing or hemoptysis.  CARDIOVASCULAR: No chest pain, orthopnea, edema.  GASTROINTESTINAL: No nausea, vomiting, diarrhea or abdominal pain.  GENITOURINARY: No dysuria, hematuria.  ENDOCRINE: No polyuria, nocturia,  HEMATOLOGY: No anemia, easy bruising or bleeding SKIN: No rash or lesion. MUSCULOSKELETAL: No joint pain or arthritis.   NEUROLOGIC: No tingling, numbness, weakness.  PSYCHIATRY: No anxiety or depression.   DRUG ALLERGIES:  No Known Allergies  VITALS:  Blood pressure 163/75, pulse 70, temperature 97.7 F (36.5 C), temperature source Oral, resp. rate 18, height 5\' 5"  (1.651 m), weight 81.194 kg (179 lb), SpO2 93 %.  PHYSICAL EXAMINATION:  GENERAL:  81 y.o.-year-old patient lying in the bed with no acute distress.  EYES: Pupils equal, round, reactive to light and accommodation. No scleral icterus. Extraocular muscles intact.  HEENT: Head atraumatic, normocephalic. Oropharynx and nasopharynx clear.  NECK:  Supple, no jugular venous distention. No thyroid enlargement, no tenderness.  LUNGS: Normal breath sounds bilaterally, no wheezing, rales,rhonchi or crepitation. No use of accessory muscles of respiration.  CARDIOVASCULAR: S1, S2 normal. No murmurs, rubs, or gallops.  ABDOMEN: Soft, nontender, nondistended. Bowel sounds present. No organomegaly or mass.  EXTREMITIES: No pedal edema, cyanosis, or clubbing.  NEUROLOGIC: Cranial nerves II through XII are intact. Muscle strength 5/5 in all extremities. Sensation intact. Gait not checked.  PSYCHIATRIC: The patient is alert and oriented x 3.  SKIN: No  obvious rash, lesion, or ulcer.    LABORATORY PANEL:   CBC  Recent Labs Lab 12/24/14 0445  WBC 6.9  HGB 11.7*  HCT 34.6*  PLT 152   ------------------------------------------------------------------------------------------------------------------  Chemistries   Recent Labs Lab 12/23/14 0949 12/24/14 0445  NA 140 140  K 3.6 3.8  CL 108 109  CO2 23 26  GLUCOSE 153* 118*  BUN 21* 18  CREATININE 1.18* 0.95  CALCIUM 8.9 8.9  AST 19  --   ALT 9*  --   ALKPHOS 56  --   BILITOT 0.6  --    ------------------------------------------------------------------------------------------------------------------  Cardiac Enzymes  Recent Labs Lab 12/24/14 0141  TROPONINI 0.03   ------------------------------------------------------------------------------------------------------------------  RADIOLOGY:  Ct Head Wo Contrast  12/23/2014  CLINICAL DATA:  Pain following fall. Right lower extremity pain and numbness EXAM: CT HEAD WITHOUT CONTRAST TECHNIQUE: Contiguous axial images were obtained from the base of the skull through the vertex without intravenous contrast. COMPARISON:  March 02, 2014 FINDINGS: There is stable age related volume loss. There is no intracranial mass, hemorrhage, extra-axial fluid collection, or midline shift. In comparison with the prior study, there is a focal somewhat wedge-shaped area of decreased attenuation the left temporal -parietal junction, suspicious for a focal acute infarct. This finding is best appreciated on axial slices 19 and 20, series 2. Elsewhere there is slight small vessel disease in the centra semiovale bilaterally. Bony calvarium appears intact. Visualized mastoid air cells are clear. IMPRESSION: Findings felt to represent a small acute infarct of the left peripheral temporal -parietal junction. Elsewhere there is age related volume loss with slight periventricular small vessel disease. No hemorrhage or apparent mass. Electronically Signed    By: Gwyndolyn Saxon  Jasmine December III M.D.   On: 12/23/2014 10:08   Mr Jodene Nam Head Wo Contrast  12/24/2014  CLINICAL DATA:  Stroke with right leg numbness below the knee. EXAM: MRI HEAD WITHOUT AND WITH CONTRAST MRA HEAD WITHOUT CONTRAST TECHNIQUE: Multiplanar, multiecho pulse sequences of the brain and surrounding structures were obtained without and with intravenous contrast. Angiographic images of the head were obtained using MRA technique without contrast. CONTRAST:  51mL MULTIHANCE GADOBENATE DIMEGLUMINE 529 MG/ML IV SOLN COMPARISON:  Head CT from yesterday FINDINGS: MRI HEAD FINDINGS Calvarium and upper cervical spine: No focal marrow signal abnormality. Orbits: No significant findings. Sinuses and Mastoids: Clear. Brain: No acute abnormality such as acute infarct, hemorrhage, hydrocephalus, or mass lesion. No evidence of large vessel occlusion. The left parietal low-density on previous head CT reflects cortically based gliosis compatible with remote small vessel infarct. Chronic small-vessel disease with mild for age ischemic gliosis throughout the bilateral cerebral hemispheres. Normal cerebral volume for age. No abnormal intracranial enhancement. MRA HEAD FINDINGS Fetal type right PCA with hypoplastic or aplastic right A1 segment. Symmetric vertebral arteries. Extensive intracranial atherosclerosis. Upper right cervical carotid and siphon is patent. Aplastic or hypoplastic A1 segment on the right. There is diffuse moderate narrowing of the right M1 segment, approaching high-grade distally. Atherosclerotic irregularity of the median vessels throughout the right MCA branches. Left cervical carotid and carotid siphon are patent. There is stenosis at the supra clinoid ICA level which is high-grade, with mild poststenotic dilatation. 1 mm conical outpouching posteriorly from the left supraclinoid carotid artery is likely a infundibulum with parent vessel below the resolution of MRA. There is a focal mild to moderate  stenosis of the distal left M1 segment. Scattered atherosclerotic type narrowing of left operculum branches, high-grade involving the proximal inferior division. Asymmetric poor flow throughout the right PCA with nonvisualized right P1 segment (at least partly from fetal type PCA persistence). There is also flow gap involving the proximal right superior cerebellar artery and despite small vessel size is convincing on source images. Focal moderate narrowing involving the left vertebral artery at the vertebrobasilar junction. Normal appearance of the basilar. No luminal based T1 hyperintensity involving the narrow vessels to suggest acute dissection. IMPRESSION: 1. No acute finding, including infarct. The left parietal abnormality on CT yesterday reflects remote small vessel infarct. 2. Extensive intracranial atherosclerosis, as described above. The most advanced and proximal stenoses involve the right PCA, right MCA, and left supra clinoid ICA. Electronically Signed   By: Monte Fantasia M.D.   On: 12/24/2014 09:22   Mr Jeri Cos [id number] Contrast  12/24/2014  CLINICAL DATA:  Stroke with right leg numbness below the knee. EXAM: MRI HEAD WITHOUT AND WITH CONTRAST MRA HEAD WITHOUT CONTRAST TECHNIQUE: Multiplanar, multiecho pulse sequences of the brain and surrounding structures were obtained without and with intravenous contrast. Angiographic images of the head were obtained using MRA technique without contrast. CONTRAST:  57mL MULTIHANCE GADOBENATE DIMEGLUMINE 529 MG/ML IV SOLN COMPARISON:  Head CT from yesterday FINDINGS: MRI HEAD FINDINGS Calvarium and upper cervical spine: No focal marrow signal abnormality. Orbits: No significant findings. Sinuses and Mastoids: Clear. Brain: No acute abnormality such as acute infarct, hemorrhage, hydrocephalus, or mass lesion. No evidence of large vessel occlusion. The left parietal low-density on previous head CT reflects cortically based gliosis compatible with remote small vessel  infarct. Chronic small-vessel disease with mild for age ischemic gliosis throughout the bilateral cerebral hemispheres. Normal cerebral volume for age. No abnormal intracranial enhancement. MRA HEAD FINDINGS Fetal type right  PCA with hypoplastic or aplastic right A1 segment. Symmetric vertebral arteries. Extensive intracranial atherosclerosis. Upper right cervical carotid and siphon is patent. Aplastic or hypoplastic A1 segment on the right. There is diffuse moderate narrowing of the right M1 segment, approaching high-grade distally. Atherosclerotic irregularity of the median vessels throughout the right MCA branches. Left cervical carotid and carotid siphon are patent. There is stenosis at the supra clinoid ICA level which is high-grade, with mild poststenotic dilatation. 1 mm conical outpouching posteriorly from the left supraclinoid carotid artery is likely a infundibulum with parent vessel below the resolution of MRA. There is a focal mild to moderate stenosis of the distal left M1 segment. Scattered atherosclerotic type narrowing of left operculum branches, high-grade involving the proximal inferior division. Asymmetric poor flow throughout the right PCA with nonvisualized right P1 segment (at least partly from fetal type PCA persistence). There is also flow gap involving the proximal right superior cerebellar artery and despite small vessel size is convincing on source images. Focal moderate narrowing involving the left vertebral artery at the vertebrobasilar junction. Normal appearance of the basilar. No luminal based T1 hyperintensity involving the narrow vessels to suggest acute dissection. IMPRESSION: 1. No acute finding, including infarct. The left parietal abnormality on CT yesterday reflects remote small vessel infarct. 2. Extensive intracranial atherosclerosis, as described above. The most advanced and proximal stenoses involve the right PCA, right MCA, and left supra clinoid ICA. Electronically Signed    By: Monte Fantasia M.D.   On: 12/24/2014 09:22   US Carotid Bilateral  12/23/2014  CLINICAL DATA:  CVA.  History of syncopal episode and hypertension. EXAM: BILATERAL CAROTID DUPLEX ULTRASOUND TECHNIQUE: Pearline Cables scale imaging, color Doppler and duplex ultrasound were performed of bilateral carotid and vertebral arteries in the neck. COMPARISON:  Head CT - 12/23/2014 FINDINGS: Criteria: Quantification of carotid stenosis is based on velocity parameters that correlate the residual internal carotid diameter with NASCET-based stenosis levels, using the diameter of the distal internal carotid lumen as the denominator for stenosis measurement. The following velocity measurements were obtained: RIGHT ICA:  93/22 cm/sec CCA:  123XX123 cm/sec SYSTOLIC ICA/CCA RATIO:  1.1 DIASTOLIC ICA/CCA RATIO:  1.6 ECA:  60 cm/sec LEFT ICA:  181/28 cm/sec CCA:  0000000 cm/sec SYSTOLIC ICA/CCA RATIO:  1.7 DIASTOLIC ICA/CCA RATIO:  1.2 ECA:  177 cm/sec RIGHT CAROTID ARTERY: There is a minimal amount of circumferential intimal thickening throughout the right common carotid artery (representative images 8, 12 and 16). There is a minimal amount intimal wall thickening within the right carotid bulb (image 20), extending to involve the origin and proximal aspect the right internal carotid artery (image 27), not resulting in elevated peak systolic velocities within the interrogated course of the right internal carotid artery to suggest a hemodynamically significant stenosis. RIGHT VERTEBRAL ARTERY:  Antegrade flow LEFT CAROTID ARTERY: There is a moderate amount of eccentric mixed echogenic plaque within the proximal and mid aspects of the left common carotid artery (representative images 39 and 43). There is a large amount of eccentric mixed echogenic partially shadowing plaque within the left carotid bulb (image 51), extending to involve the origin of the left internal carotid artery (image 59), resulting in elevated peak systolic velocities  within the proximal aspect of the left internal carotid artery (greatest acquired peak systolic velocity within the proximal left ICA measures 181 cm/sec - image 61). LEFT VERTEBRAL ARTERY:  Antegrade flow Incidental note is made of an apparent transient cardiac arrhythmia (image 37). IMPRESSION: 1. Large amount of left-sided  atherosclerotic plaque results in elevated peak systolic velocities within the left internal carotid artery compatible with the 50-69% luminal narrowing range. Further evaluation with CTA could be performed as clinically indicated. 2. Minimal amount of right-sided intimal wall thickening, not resulting in a hemodynamically significant stenosis. 3. Apparent transient cardiac arrhythmia. Further evaluation with ECG monitoring could be performed as clinically indicated. Electronically Signed   By: Sandi Mariscal M.D.   On: 12/23/2014 16:25    EKG:   Orders placed or performed during the hospital encounter of 12/23/14  . ED EKG  . ED EKG  . EKG 12-Lead  . EKG 12-Lead    ASSESSMENT AND PLAN:   #1 acute CVA: ruled out with neg MRI MRI with no acute CVA but revealed atherosclerosis telemetry.  pending 2-D echocardiogram  no trend of cycled cardiac enzymes.   physical therapy consultation- recomends snf.   Neurology recommended asa and plavix for significant attherosclerosis  Lt ICA stenosis 50-69% - vascular recs OP f/u and CTA prn   #2 chronic ischemic right lower extremity:   cold to touch, unable to find pulse with Doppler.   vascular surgery recommended op f/u as its chronic, d/w dr.Dew  #3 acute Rt knee pain with effusion , elevated uric acid- prob acute gouty arthritis of rt knee Solumedrol Indocin Consult rheumatology for fluid aspiration PT eval  34  hypertension: Blood pressure stable . Will resume  antihypertensives and titrate prn  #4 chronic kidney disease stage III: Baseline creatinine 1.15, close to baseline at this time. Stable  #5 hyperglycemia:   hemoglobin A1c- 5.7     # Disposition: PT recommends SNF, [facility] per RN report  All the records are reviewed and case discussed with Care Management/Social Workerr. Management plans discussed with the patient, family and they are in agreement.  CODE STATUS: FULL  TOTAL TIME TAKING CARE OF THIS PATIENT: 35 minutes.   POSSIBLE D/C IN 1-2 DAYS, DEPENDING ON CLINICAL CONDITION.   Nicholes Mango M.D on 12/24/2014 at 7:45 PM  Between 7am to 6pm - Pager - [phone] After 6pm go to [email or web address] - password [email or web address] [place] [facility]  Office  [phone]  CC: Primary care physician; Pcp Not In System

## 2014-12-24 NOTE — Plan of Care (Addendum)
Problem: Discharge/Transitional Outcomes Goal: Other Discharge Outcomes/Goals Outcome: Progressing Plan of care progress to goals: 1. C/o right lower leg/foot pain 10/10. Percocet x1, Morphine x1 which took a while to be effective but since pt has been resting comfortably. 2. Hemodynamically:             -VSS, afebrile              -Neuro/VS Q2H per protocol              -NIH 0             -Uric acid 7.4, pt has history of gout which could possibly be causing her leg pain but no swelling, warmth, or redness noted  3. Heart healthy/Carb modified diet tolerating well  4. High fall risk. Daughters at bedside throughout out night, bed alarm on. Hourly rounding.

## 2014-12-24 NOTE — Care Management (Addendum)
Admitted to [facility] with the diagnosis of CVA. Lives with daughter, [place]. Sister is Fabio Neighbors [phone]). [facility]. States she has been [facility] to [facility] for [facility],  No home Health. No skilled nursing. Uses a cane to aid in ambulation. States she would like a rolling walker when discharged, No Life Alert. Fell 2 weeks ago. Decreased appetite.  Physical therapy evaluation completed yesterday. Ambulated 60 feet) Recommends skilled nursing facility. "I ain't going to no nursing home."  [name] RN MSN Care Management [phone]

## 2014-12-24 NOTE — Plan of Care (Signed)
Problem: Discharge/Transitional Outcomes Goal: Other Discharge Outcomes/Goals Outcome: Progressing Picked up patient at 1700. MRI negative for stroke. NIH - 0 Rheumatology consult done, possible vascular studies to be done on RLE. Plan to d/c to [facility] when ready.

## 2014-12-24 NOTE — Consult Note (Signed)
  Reason for Consult: Rule out gout  Referring Physician: Hospitalist  Katelyn [name]   HPI: 81 year old Afro-American female. History of hypertension. Her history of gouty arthritis. No recent flares. 2 weeks ago she said she had fallen to the right leg. Was having some pain in the leg. Using a cane. No ecchymosis. No swelling. Today she awakened with severe pain in the leg. Says most of the pain is in the bone, shin She's not had any significant numbness or tingling. There is no swelling. She presented to the hospital. Workup for stroke or vascular insufficiency. Uric acid 7.4. Raise the question of whether she could have gout in the leg. The knee has not been swollen nor has the ankle. She's not had recent swelling of the toe. Recent creatinine 0.95. Count 6000. Hemoglobin 11.7. Cerebrovascular workup shows some intracerebral atherosclerosis.  PMH: Hypertension. Renal insufficiency. Peripheral vascular disease.  SURGICAL HISTORY: Noncontributory. No back or joint surgery  Family History: Negative for gouty arthritis  Social History: Significant alcohol or cigarettes  Allergies: No Known Allergies  Medications: Continuous:       ROS: No recent elbow pain. No recent hands or other joint pain. No fever.   PHYSICAL EXAM: Blood pressure 155/74, pulse 71, temperature 98 F (36.7 C), temperature source Oral, resp. rate 18, height 5\' 5"  (1.651 m), weight 81.194 kg (179 lb), SpO2 100 %. pleasant female. Asleep on initial examination. Fell asleep during the conversation. No Acute distress. No significant edema. Good femoral pulses. Decreased dorsalis pedis pulses. Both feet are mildly cool but there is no cyanosis Good range of motion of her cervical spine and shoulders. Elbows without tophi. Hands without synovitis. Hips move well. Right knee without effusion. knee flexes without pain. rightTibia mildly tender to palpation diffusely. Ankles move well mild dropped arch. No ankle  synovitis. No erythema. She has symmetric knee and ankle jerks.able to Raise the leg against power. She has 5 over 5 plantar and dorsiflexion.   Assessment: No evidence of synovitis to suggest  gouty synovitis Right leg  pain. Worse after fall. No clear evidence of fracture. Not rule out spinal stenosis Atherosclerotic vascular disease  Recommendations: Consider x-ray right knee. Consider x-ray right tibia. PT Persistent right leg pain out of proportion to findings consider workup for spinal stenosis Let me know if she develops any swollen joints  Thank you Very much Katelyn [name] 12/24/2014, 1:20 PM

## 2014-12-24 NOTE — Plan of Care (Signed)
Problem: Discharge/Transitional Outcomes Goal: Other Discharge Outcomes/Goals Outcome: Progressing MRI negative for any new stroke.

## 2014-12-24 NOTE — Progress Notes (Signed)
[facility]  Daily Progress Note  Subjective: Patient states she is feeling better today. States the pain in her right leg is "better".   Objective: Filed Vitals:   12/24/14 0008 12/24/14 0200 12/24/14 0353 12/24/14 0615  BP: 141/58 148/60 128/53 155/74  Pulse: 78 68 62 71  Temp: 97.5 F (36.4 C) 97.5 F (36.4 C) 97.5 F (36.4 C) 98 F (36.7 C)  TempSrc: Oral Oral Oral Oral  Resp: 18 18 18 18   Height:      Weight:      SpO2: 94% 97% 97% 100%    Intake/Output Summary (Last 24 hours) at 12/24/14 1337 Last data filed at 12/24/14 0800  Gross per 24 hour  Intake      0 ml  Output    300 ml  Net   -300 ml    Physical Exam: A&Ox3, NAD CV: RRR Pulmonary: CTA Bilaterally Abdomen: Soft, Nontender, Nondistended Vascular: Right Lower Extremity: palpable femoral pulses thigh warm - extremity becomes cooler distally. No cyanosis, no ulceration, non-palpable PT and DP pulses. Minimal edema. Left Lower Extremity: palpable femoral pulses thigh warm - extremity becomes cooler distally. No cyanosis, no ulceration, non-palpable PT and DP pulses. Minimal edema   Laboratory: CBC    Component Value Date/Time   WBC 6.9 12/24/2014 0445   WBC 5.8 09/03/2014 1128   WBC 11.4* 06/06/2014 1002   HGB 11.7* 12/24/2014 0445   HGB 12.3 06/06/2014 1002   HCT 34.6* 12/24/2014 0445   HCT 37.5 09/03/2014 1128   HCT 38.6 06/06/2014 1002   PLT 152 12/24/2014 0445   PLT 252 06/06/2014 1002    BMET    Component Value Date/Time   NA 140 12/24/2014 0445   NA 142 10/20/2014 1007   NA 139 06/06/2014 1002   K 3.8 12/24/2014 0445   K 4.0 06/06/2014 1002   CL 109 12/24/2014 0445   CL 105 06/06/2014 1002   CO2 26 12/24/2014 0445   CO2 24 06/06/2014 1002   GLUCOSE 118* 12/24/2014 0445   GLUCOSE 98 10/20/2014 1007   GLUCOSE 170* 06/06/2014 1002   BUN 18 12/24/2014 0445   BUN 19 10/20/2014 1007   BUN 34* 06/06/2014 1002   CREATININE 0.95 12/24/2014 0445   CREATININE 1.44*  06/06/2014 1002   CREATININE 1.2* 08/31/2013   CALCIUM 8.9 12/24/2014 0445   CALCIUM 9.0 06/06/2014 1002   GFRNONAA 52* 12/24/2014 0445   GFRNONAA 32* 06/06/2014 1002   GFRNONAA 36* 03/02/2014 0658   GFRAA 60* 12/24/2014 0445   GFRAA 28* 06/06/2014 1002   GFRAA 44* 03/02/2014 0658    Assessment/Planning: Johnye Ostroff is a 81 y.o. female with a known history of hypertension, gout, chronic kidney disease stage III, Paget's disease who presented to the ED with right sided weakness and right lower extremity pain. No acute infarct on MRI. 1) Symptomatically better this AM - no acute vascular compromise to extremity, most likely chronic on acute ischemia 2) Recommend dual therapy with ASA and plavix 3) If patient symptoms worsen possible angiogram on Monday.  [name] PA-C 12/24/2014 1:37 PM

## 2014-12-24 NOTE — Care Management (Signed)
Received telephone call from physician at [facility] program. Will see Katelyn [name] starting on Monday, if discharged over the weekend,  5 days a week. Daughter can transport home over the weekend, if needed.  [name] RN MSN Care Management [phone]

## 2014-12-24 NOTE — Progress Notes (Signed)
Uric acid ordered at the 2200 hour. Lab to add to blood which was already collected. Called lab due to no results for uric acid resulted. Lab did not add to blood already collected, Troponin due now will add to that collection per lab tech.

## 2014-12-24 NOTE — Progress Notes (Signed)
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  Per Neurology note 12/24/14 MRI showing old infarct.  Pt with Rheumatology and Vascular surgery consult.    PT Comments    Pt tolerated LE therapeutic exercises in bed fairly well with 3/10 R knee pain.  MRI shows no acute infarct.  Pt with rheumatology consult d/t R LE pain (which mentioned to consider imaging).  Limited session d/t pt's dinner present and pt wanting to eat.  Will continue to progress pt per pt tolerance.   Follow Up Recommendations  SNF     Equipment Recommendations  Rolling walker with 5" wheels    Recommendations for Other Services       Precautions / Restrictions Precautions Precautions: Fall Restrictions Weight Bearing Restrictions: No    Mobility  Bed Mobility                  Transfers                    Ambulation/Gait                 Stairs            Wheelchair Mobility    Modified Rankin (Stroke Patients Only)       Balance                                    Cognition Arousal/Alertness: Awake/alert Behavior During Therapy: Impulsive Overall Cognitive Status: Within Functional Limits for tasks assessed                      Exercises   Performed semi-supine B LE therapeutic exercise x 10 reps:  Ankle pumps (AROM B LE's); quad sets x3 second holds (AROM B LE's); glute squeezes x3 second holds (AROM B); hip aDduction isometrics (pillow between pt's knees) x3 second holds; SAQ's (AROM R; AROM L); heelslides (AROM R; AROM L), hip abd/adduction (AROM R; AROM L), and SLR (AROM R; AROM L).  Pt required vc's and tactile cues for correct technique with exercises.     General Comments    Nursing cleared pt for participation in physical therapy.  Pt agreeable to PT session.      Pertinent Vitals/Pain Pain Assessment: 0-10 Pain Score: 3  Pain Location: R knee Pain Descriptors / Indicators: Aching;Constant Pain Intervention(s): Limited activity within patient's tolerance;Monitored during session;Repositioned  Vitals stable and WFL throughout treatment session.    Home Living                      Prior Function            PT Goals (current goals can now be found in the care plan section) Acute Rehab PT Goals Patient Stated Goal: to get stronger PT Goal Formulation: With patient Time For Goal Achievement: 01/06/15 Potential to Achieve Goals: Good Progress towards PT goals: Progressing toward goals (with LE strengthening)    Frequency  Min 2X/week    PT Plan Frequency needs to be updated (Pt not an acute CVA (per MRI/neuro note pt with old infarct))    Co-evaluation  End of Session   Activity Tolerance: Patient tolerated treatment well Patient left: in bed;with call bell/phone within reach;with bed alarm set     Time: [id number] PT Time Calculation (min) (ACUTE ONLY): 10 min  Charges:  $Therapeutic Exercise: 8-22 mins                    G CodesLeitha Bleak 12/31/2014, 4:50 PM Leitha Bleak, [place]

## 2014-12-24 NOTE — Progress Notes (Signed)
Pt alert and oriented back from MRI and MRA no acute infarct noted on test.

## 2014-12-24 NOTE — Plan of Care (Signed)
Problem: Discharge/Transitional Outcomes Goal: Other Discharge Outcomes/Goals Outcome: Progressing Pt alert and oriented x4, feeling better and RLE is feeling better per patient. Follows commands [facility] of ), MRI shows that infarct is old and pt only has ischemic changes. Gout to right knee, percocet given x 1 with noted relief. Family at bedside. Possible PT evaluation needed for rehab

## 2014-12-24 NOTE — Consult Note (Signed)
CC: RLE weakness   HPI: Katelyn [name] is an 81 y.o. female with history of hypertension. The patient reports that she felt fine last night, she woke up this morning and was having pain in her right leg and numbness from about the right knee down to the right foot. When she attempted to stand she reports that her foot was weak and sort of gave out on her this morning, and she was unable to stand upon it. No weakness on the RUE or face.   CTH left peripheral temporal -parietal junction.  Past Medical History  Diagnosis Date  . Hypertension   . Chronic kidney disease   . Paget's disease of bone   . Gout     Past Surgical History  Procedure Laterality Date  . Partial hysterectomy      Family History  Problem Relation Age of Onset  . Diabetes Mother   . Cancer Sister   . Cancer Sister   . Cancer Sister     Social History:  reports that she has never smoked. She does not have any smokeless tobacco history on file. She reports that she does not drink alcohol or use illicit drugs.  No Known Allergies  Medications: I have reviewed the patient's current medications.   Physical Examination: Blood pressure 155/74, pulse 71, temperature 98 F (36.7 C), temperature source Oral, resp. rate 18, height 5\' 5"  (1.651 m), weight 81.194 kg (179 lb), SpO2 100 %.    Neurological Examination Mental Status: Alert, oriented, thought content appropriate.  Speech fluent without evidence of aphasia.  Able to follow 3 step commands without difficulty. Cranial Nerves: II: Discs flat bilaterally; Visual fields grossly normal, pupils equal, round, reactive to light and accommodation III,IV, VI: ptosis not present, extra-ocular motions intact bilaterally V,VII: smile symmetric, facial light touch sensation normal bilaterally VIII: hearing normal bilaterally IX,X: gag reflex present XI: bilateral shoulder shrug XII: midline tongue extension Motor: Right : Upper extremity   4+/5    Left:      Upper extremity   4+/5  Lower extremity   4+/5     Lower extremity   4+/5 Tone and bulk:normal tone throughout; no atrophy noted Sensory: Pinprick and light touch intact throughout, bilaterally Deep Tendon Reflexes: 1+ and symmetric throughout Plantars: Right: downgoing   Left: downgoing Cerebellar: normal finger-to-nose, normal rapid alternating movements and normal heel-to-shin test Gait: not tested     Laboratory Studies:   Basic Metabolic Panel:  Recent Labs Lab 12/23/14 0949 12/24/14 0445  NA 140 140  K 3.6 3.8  CL 108 109  CO2 23 26  GLUCOSE 153* 118*  BUN 21* 18  CREATININE 1.18* 0.95  CALCIUM 8.9 8.9    Liver Function Tests:  Recent Labs Lab 12/23/14 0949  AST 19  ALT 9*  ALKPHOS 56  BILITOT 0.6  PROT 6.3*  ALBUMIN 3.5   No results for input(s): LIPASE, AMYLASE in the last 168 hours. No results for input(s): AMMONIA in the last 168 hours.  CBC:  Recent Labs Lab 12/23/14 0949 12/24/14 0445  WBC 7.2 6.9  HGB 12.1 11.7*  HCT 36.1 34.6*  MCV 95.8 95.1  PLT 165 152    Cardiac Enzymes:  Recent Labs Lab 12/23/14 0949 12/23/14 1321 12/23/14 1850 12/24/14 0141  TROPONINI <0.03 <0.03 <0.03 0.03    BNP: Invalid input(s): POCBNP  CBG: No results for input(s): GLUCAP in the last 168 hours.  Microbiology: Results for orders placed or performed during the hospital encounter  of 10/13/14  Urine culture     Status: None   Collection Time: 10/13/14  2:15 PM  Result Value Ref Range Status   Specimen Description URINE, CLEAN CATCH  Final   Special Requests NONE  Final   Culture INSIGNIFICANT GROWTH  Final   Report Status 10/14/2014 FINAL  Final    Coagulation Studies: No results for input(s): LABPROT, INR in the last 72 hours.  Urinalysis: No results for input(s): COLORURINE, LABSPEC, PHURINE, GLUCOSEU, HGBUR, BILIRUBINUR, KETONESUR, PROTEINUR, UROBILINOGEN, NITRITE, LEUKOCYTESUR in the last 168 hours.  Invalid input(s):  APPERANCEUR  Lipid Panel:     Component Value Date/Time   CHOL 160 12/24/2014 0445   TRIG 82 12/24/2014 0445   HDL 40* 12/24/2014 0445   CHOLHDL 4.0 12/24/2014 0445   VLDL 16 12/24/2014 0445   LDLCALC 104* 12/24/2014 0445    HgbA1C:  Lab Results  Component Value Date   HGBA1C 5.7 12/23/2014    Urine Drug Screen:  No results found for: LABOPIA, COCAINSCRNUR, LABBENZ, AMPHETMU, THCU, LABBARB  Alcohol Level: No results for input(s): ETH in the last 168 hours.  Other results: EKG: normal EKG, normal sinus rhythm, unchanged from previous tracings.  Imaging: Ct Head Wo Contrast  12/23/2014  CLINICAL DATA:  Pain following fall. Right lower extremity pain and numbness EXAM: CT HEAD WITHOUT CONTRAST TECHNIQUE: Contiguous axial images were obtained from the base of the skull through the vertex without intravenous contrast. COMPARISON:  March 02, 2014 FINDINGS: There is stable age related volume loss. There is no intracranial mass, hemorrhage, extra-axial fluid collection, or midline shift. In comparison with the prior study, there is a focal somewhat wedge-shaped area of decreased attenuation the left temporal -parietal junction, suspicious for a focal acute infarct. This finding is best appreciated on axial slices 19 and 20, series 2. Elsewhere there is slight small vessel disease in the centra semiovale bilaterally. Bony calvarium appears intact. Visualized mastoid air cells are clear. IMPRESSION: Findings felt to represent a small acute infarct of the left peripheral temporal -parietal junction. Elsewhere there is age related volume loss with slight periventricular small vessel disease. No hemorrhage or apparent mass. Electronically Signed   By: Lowella Grip III M.D.   On: 12/23/2014 10:08   Mr Jodene Nam Head Wo Contrast  12/24/2014  CLINICAL DATA:  Stroke with right leg numbness below the knee. EXAM: MRI HEAD WITHOUT AND WITH CONTRAST MRA HEAD WITHOUT CONTRAST TECHNIQUE: Multiplanar,  multiecho pulse sequences of the brain and surrounding structures were obtained without and with intravenous contrast. Angiographic images of the head were obtained using MRA technique without contrast. CONTRAST:  19mL MULTIHANCE GADOBENATE DIMEGLUMINE 529 MG/ML IV SOLN COMPARISON:  Head CT from yesterday FINDINGS: MRI HEAD FINDINGS Calvarium and upper cervical spine: No focal marrow signal abnormality. Orbits: No significant findings. Sinuses and Mastoids: Clear. Brain: No acute abnormality such as acute infarct, hemorrhage, hydrocephalus, or mass lesion. No evidence of large vessel occlusion. The left parietal low-density on previous head CT reflects cortically based gliosis compatible with remote small vessel infarct. Chronic small-vessel disease with mild for age ischemic gliosis throughout the bilateral cerebral hemispheres. Normal cerebral volume for age. No abnormal intracranial enhancement. MRA HEAD FINDINGS Fetal type right PCA with hypoplastic or aplastic right A1 segment. Symmetric vertebral arteries. Extensive intracranial atherosclerosis. Upper right cervical carotid and siphon is patent. Aplastic or hypoplastic A1 segment on the right. There is diffuse moderate narrowing of the right M1 segment, approaching high-grade distally. Atherosclerotic irregularity of the median vessels  throughout the right MCA branches. Left cervical carotid and carotid siphon are patent. There is stenosis at the supra clinoid ICA level which is high-grade, with mild poststenotic dilatation. 1 mm conical outpouching posteriorly from the left supraclinoid carotid artery is likely a infundibulum with parent vessel below the resolution of MRA. There is a focal mild to moderate stenosis of the distal left M1 segment. Scattered atherosclerotic type narrowing of left operculum branches, high-grade involving the proximal inferior division. Asymmetric poor flow throughout the right PCA with nonvisualized right P1 segment (at least  partly from fetal type PCA persistence). There is also flow gap involving the proximal right superior cerebellar artery and despite small vessel size is convincing on source images. Focal moderate narrowing involving the left vertebral artery at the vertebrobasilar junction. Normal appearance of the basilar. No luminal based T1 hyperintensity involving the narrow vessels to suggest acute dissection. IMPRESSION: 1. No acute finding, including infarct. The left parietal abnormality on CT yesterday reflects remote small vessel infarct. 2. Extensive intracranial atherosclerosis, as described above. The most advanced and proximal stenoses involve the right PCA, right MCA, and left supra clinoid ICA. Electronically Signed   By: Monte Fantasia M.D.   On: 12/24/2014 09:22   Mr Jeri Cos [id number] Contrast  12/24/2014  CLINICAL DATA:  Stroke with right leg numbness below the knee. EXAM: MRI HEAD WITHOUT AND WITH CONTRAST MRA HEAD WITHOUT CONTRAST TECHNIQUE: Multiplanar, multiecho pulse sequences of the brain and surrounding structures were obtained without and with intravenous contrast. Angiographic images of the head were obtained using MRA technique without contrast. CONTRAST:  86mL MULTIHANCE GADOBENATE DIMEGLUMINE 529 MG/ML IV SOLN COMPARISON:  Head CT from yesterday FINDINGS: MRI HEAD FINDINGS Calvarium and upper cervical spine: No focal marrow signal abnormality. Orbits: No significant findings. Sinuses and Mastoids: Clear. Brain: No acute abnormality such as acute infarct, hemorrhage, hydrocephalus, or mass lesion. No evidence of large vessel occlusion. The left parietal low-density on previous head CT reflects cortically based gliosis compatible with remote small vessel infarct. Chronic small-vessel disease with mild for age ischemic gliosis throughout the bilateral cerebral hemispheres. Normal cerebral volume for age. No abnormal intracranial enhancement. MRA HEAD FINDINGS Fetal type right PCA with hypoplastic or  aplastic right A1 segment. Symmetric vertebral arteries. Extensive intracranial atherosclerosis. Upper right cervical carotid and siphon is patent. Aplastic or hypoplastic A1 segment on the right. There is diffuse moderate narrowing of the right M1 segment, approaching high-grade distally. Atherosclerotic irregularity of the median vessels throughout the right MCA branches. Left cervical carotid and carotid siphon are patent. There is stenosis at the supra clinoid ICA level which is high-grade, with mild poststenotic dilatation. 1 mm conical outpouching posteriorly from the left supraclinoid carotid artery is likely a infundibulum with parent vessel below the resolution of MRA. There is a focal mild to moderate stenosis of the distal left M1 segment. Scattered atherosclerotic type narrowing of left operculum branches, high-grade involving the proximal inferior division. Asymmetric poor flow throughout the right PCA with nonvisualized right P1 segment (at least partly from fetal type PCA persistence). There is also flow gap involving the proximal right superior cerebellar artery and despite small vessel size is convincing on source images. Focal moderate narrowing involving the left vertebral artery at the vertebrobasilar junction. Normal appearance of the basilar. No luminal based T1 hyperintensity involving the narrow vessels to suggest acute dissection. IMPRESSION: 1. No acute finding, including infarct. The left parietal abnormality on CT yesterday reflects remote small vessel infarct. 2. Extensive intracranial  atherosclerosis, as described above. The most advanced and proximal stenoses involve the right PCA, right MCA, and left supra clinoid ICA. Electronically Signed   By: Monte Fantasia M.D.   On: 12/24/2014 09:22   US Carotid Bilateral  12/23/2014  CLINICAL DATA:  CVA.  History of syncopal episode and hypertension. EXAM: BILATERAL CAROTID DUPLEX ULTRASOUND TECHNIQUE: Pearline Cables scale imaging, color Doppler and  duplex ultrasound were performed of bilateral carotid and vertebral arteries in the neck. COMPARISON:  Head CT - 12/23/2014 FINDINGS: Criteria: Quantification of carotid stenosis is based on velocity parameters that correlate the residual internal carotid diameter with NASCET-based stenosis levels, using the diameter of the distal internal carotid lumen as the denominator for stenosis measurement. The following velocity measurements were obtained: RIGHT ICA:  93/22 cm/sec CCA:  123XX123 cm/sec SYSTOLIC ICA/CCA RATIO:  1.1 DIASTOLIC ICA/CCA RATIO:  1.6 ECA:  60 cm/sec LEFT ICA:  181/28 cm/sec CCA:  0000000 cm/sec SYSTOLIC ICA/CCA RATIO:  1.7 DIASTOLIC ICA/CCA RATIO:  1.2 ECA:  177 cm/sec RIGHT CAROTID ARTERY: There is a minimal amount of circumferential intimal thickening throughout the right common carotid artery (representative images 8, 12 and 16). There is a minimal amount intimal wall thickening within the right carotid bulb (image 20), extending to involve the origin and proximal aspect the right internal carotid artery (image 27), not resulting in elevated peak systolic velocities within the interrogated course of the right internal carotid artery to suggest a hemodynamically significant stenosis. RIGHT VERTEBRAL ARTERY:  Antegrade flow LEFT CAROTID ARTERY: There is a moderate amount of eccentric mixed echogenic plaque within the proximal and mid aspects of the left common carotid artery (representative images 39 and 43). There is a large amount of eccentric mixed echogenic partially shadowing plaque within the left carotid bulb (image 51), extending to involve the origin of the left internal carotid artery (image 59), resulting in elevated peak systolic velocities within the proximal aspect of the left internal carotid artery (greatest acquired peak systolic velocity within the proximal left ICA measures 181 cm/sec - image 61). LEFT VERTEBRAL ARTERY:  Antegrade flow Incidental note is made of an apparent  transient cardiac arrhythmia (image 37). IMPRESSION: 1. Large amount of left-sided atherosclerotic plaque results in elevated peak systolic velocities within the left internal carotid artery compatible with the 50-69% luminal narrowing range. Further evaluation with CTA could be performed as clinically indicated. 2. Minimal amount of right-sided intimal wall thickening, not resulting in a hemodynamically significant stenosis. 3. Apparent transient cardiac arrhythmia. Further evaluation with ECG monitoring could be performed as clinically indicated. Electronically Signed   By: Sandi Mariscal M.D.   On: 12/23/2014 16:25     Assessment/Plan:  81 y.o. female with history of hypertension. The patient reports that she felt fine two days ago, she woke up and was having pain in her right leg and numbness from about the right knee down to the right foot. When she attempted to stand she reports that her foot was weak and sort of gave out on her this morning, and she was unable to stand upon it. No weakness on the RUE or face.   CTH left peripheral temporal -parietal junction.  Pt's exam improved this AM. Right leg weakness has improved. Today complaining more of pain rather then weakness.   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  - possibly vascular studies to the LE such as ankle brachial index to look for claudication or other cause of vascular compromise.    -  no further imaging from neuro stand point.   - appreciate vascular evaluation, agree likely not good candidate for angiogram due to renal impairment.   - Have no opposition to dual anti platelet therapy with ASA and plavix in setting of significant intracranial stenosis.   Leotis Pain   12/24/2014, 10:10 AM

## 2014-12-25 ENCOUNTER — Inpatient Hospital Stay: Payer: Medicare (Managed Care)

## 2014-12-25 MED ORDER — PREDNISONE 50 MG PO TABS
50.0000 mg | ORAL_TABLET | Freq: Every day | ORAL | Status: DC
  Administered 2014-12-26 – 2014-12-27 (×2): 50 mg via ORAL
  Filled 2014-12-25 (×2): qty 1

## 2014-12-25 NOTE — Plan of Care (Signed)
Problem: Discharge/Transitional Outcomes Goal: PCP appointment made and transportation plan in place Plan of care progress to goal: Possible discharge over the weekend with [facility] program to start on Monday Diet: tolerating Mobility: minimal assistance

## 2014-12-25 NOTE — Progress Notes (Signed)
Problem: Discharge/Transitional Outcomes Goal:Pain Management  Plan of care progress to goal: Right ankle and knee pain, pain med given x3 Knee and Ankle Xray ordered Ortho consult completed  Diet: tolerating  Mobility: minimal assistance

## 2014-12-25 NOTE — Consult Note (Addendum)
ORTHOPAEDIC CONSULTATION  REQUESTING PHYSICIAN: Nicholes Mango, MD  Chief Complaint: Right knee pain and swelling  HPI: Katelyn [name] is a 81 y.o. female who I was asked to see for right knee pain and swelling.  Patient states she had a recent fall. Earlier she was having difficulty walking. She states that her pain is improved. She also reports having had a right knee effusion earlier which has dramatically improved. She does have a history of gout. She has received Solu-Medrol and indomethacin. Her daughter is at the bedside and helps provide history. Patient has had an MRI which has ruled out a recent stroke. She has been seen by Dr. Lucky Cowboy from vascular surgery and the patient has peripheral vascular disease. She is also been seen by neurology. 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 In addition the patient was seen by Dr. Jefm Bryant from rheumatology who did not feel the patient had evident evidence of gouty synovitis.  Past Medical History  Diagnosis Date  . Hypertension   . Chronic kidney disease   . Paget's disease of bone   . Gout    Past Surgical History  Procedure Laterality Date  . Partial hysterectomy     Social History   Social History  . Marital Status: Widowed    Spouse Name: N/A  . Number of Children: N/A  . Years of Education: N/A   Social History Main Topics  . Smoking status: Never Smoker   . Smokeless tobacco: None  . Alcohol Use: No  . Drug Use: No  . Sexual Activity: Not Asked   Other Topics Concern  . None   Social History Narrative   Family History  Problem Relation Age of Onset  . Diabetes Mother   . Cancer Sister   . Cancer Sister   . Cancer Sister    No Known Allergies Prior to Admission medications   Medication Sig Start Date End Date Taking? Authorizing Provider  amLODipine (NORVASC) 10 MG tablet TAKE ONE TABLET EVERY DAY (FOR BLOOD PRESSURE) 12/09/14  Yes Glean Hess, MD  hydrocortisone (ANUSOL-HC) 2.5 %  rectal cream Place 1 application rectally 2 (two) times daily as needed for hemorrhoids.   Yes Historical Provider, MD  losartan (COZAAR) 50 MG tablet Take 50 mg by mouth daily.   Yes Historical Provider, MD  polyethylene glycol (MIRALAX / GLYCOLAX) packet Take 17 g by mouth daily.   Yes Historical Provider, MD  fluticasone (FLONASE) 50 MCG/ACT nasal spray Place 2 sprays into both nostrils daily. Patient not taking: Reported on 12/23/2014 09/03/14   Glean Hess, MD  LORazepam (ATIVAN) 1 MG tablet Take 1 tablet (1 mg total) by mouth 2 (two) times daily. Patient not taking: Reported on 12/23/2014 10/25/14 11/01/15  Daymon Larsen, MD  ondansetron (ZOFRAN) 4 MG tablet Take 1 tablet (4 mg total) by mouth every 8 (eight) hours as needed for nausea or vomiting. Patient not taking: Reported on 12/23/2014 10/25/14 10/29/15  Daymon Larsen, MD   Dg Knee 1-2 Views Right  12/25/2014  CLINICAL DATA:  Right knee and right ankle pain with no known injury. EXAM: RIGHT KNEE - 1-2 VIEW COMPARISON:  None. FINDINGS: No fracture. There is narrowing of the medial patellofemoral joint space compartments with marginal osteophytes from all 3 compartments. Ossified/calcified densities are seen posteriorly. There is no joint effusion. Bones are demineralized. Soft tissues are unremarkable. IMPRESSION: 1. No fracture or acute finding. 2. Osteoarthritis.  Probable posterior intra-articular bodies. Electronically Signed  By: Lajean Manes M.D.   On: 12/25/2014 14:16   Dg Ankle Complete Right  12/25/2014  CLINICAL DATA:  Right ankle pain, no known injury EXAM: RIGHT ANKLE - COMPLETE 3+ VIEW COMPARISON:  None. FINDINGS: Three views of the right ankle submitted. No acute fracture or subluxation. Ankle mortise is preserved. IMPRESSION: Negative. Electronically Signed   By: Lahoma Crocker M.D.   On: 12/25/2014 14:21   Mr Jodene Nam Head Wo Contrast  12/24/2014  CLINICAL DATA:  Stroke with right leg numbness below the knee. EXAM: MRI HEAD  WITHOUT AND WITH CONTRAST MRA HEAD WITHOUT CONTRAST TECHNIQUE: Multiplanar, multiecho pulse sequences of the brain and surrounding structures were obtained without and with intravenous contrast. Angiographic images of the head were obtained using MRA technique without contrast. CONTRAST:  53mL MULTIHANCE GADOBENATE DIMEGLUMINE 529 MG/ML IV SOLN COMPARISON:  Head CT from yesterday FINDINGS: MRI HEAD FINDINGS Calvarium and upper cervical spine: No focal marrow signal abnormality. Orbits: No significant findings. Sinuses and Mastoids: Clear. Brain: No acute abnormality such as acute infarct, hemorrhage, hydrocephalus, or mass lesion. No evidence of large vessel occlusion. The left parietal low-density on previous head CT reflects cortically based gliosis compatible with remote small vessel infarct. Chronic small-vessel disease with mild for age ischemic gliosis throughout the bilateral cerebral hemispheres. Normal cerebral volume for age. No abnormal intracranial enhancement. MRA HEAD FINDINGS Fetal type right PCA with hypoplastic or aplastic right A1 segment. Symmetric vertebral arteries. Extensive intracranial atherosclerosis. Upper right cervical carotid and siphon is patent. Aplastic or hypoplastic A1 segment on the right. There is diffuse moderate narrowing of the right M1 segment, approaching high-grade distally. Atherosclerotic irregularity of the median vessels throughout the right MCA branches. Left cervical carotid and carotid siphon are patent. There is stenosis at the supra clinoid ICA level which is high-grade, with mild poststenotic dilatation. 1 mm conical outpouching posteriorly from the left supraclinoid carotid artery is likely a infundibulum with parent vessel below the resolution of MRA. There is a focal mild to moderate stenosis of the distal left M1 segment. Scattered atherosclerotic type narrowing of left operculum branches, high-grade involving the proximal inferior division. Asymmetric poor  flow throughout the right PCA with nonvisualized right P1 segment (at least partly from fetal type PCA persistence). There is also flow gap involving the proximal right superior cerebellar artery and despite small vessel size is convincing on source images. Focal moderate narrowing involving the left vertebral artery at the vertebrobasilar junction. Normal appearance of the basilar. No luminal based T1 hyperintensity involving the narrow vessels to suggest acute dissection. IMPRESSION: 1. No acute finding, including infarct. The left parietal abnormality on CT yesterday reflects remote small vessel infarct. 2. Extensive intracranial atherosclerosis, as described above. The most advanced and proximal stenoses involve the right PCA, right MCA, and left supra clinoid ICA. Electronically Signed   By: Monte Fantasia M.D.   On: 12/24/2014 09:22   Mr Jeri Cos [id number] Contrast  12/24/2014  CLINICAL DATA:  Stroke with right leg numbness below the knee. EXAM: MRI HEAD WITHOUT AND WITH CONTRAST MRA HEAD WITHOUT CONTRAST TECHNIQUE: Multiplanar, multiecho pulse sequences of the brain and surrounding structures were obtained without and with intravenous contrast. Angiographic images of the head were obtained using MRA technique without contrast. CONTRAST:  76mL MULTIHANCE GADOBENATE DIMEGLUMINE 529 MG/ML IV SOLN COMPARISON:  Head CT from yesterday FINDINGS: MRI HEAD FINDINGS Calvarium and upper cervical spine: No focal marrow signal abnormality. Orbits: No significant findings. Sinuses and Mastoids: Clear. Brain: No acute  abnormality such as acute infarct, hemorrhage, hydrocephalus, or mass lesion. No evidence of large vessel occlusion. The left parietal low-density on previous head CT reflects cortically based gliosis compatible with remote small vessel infarct. Chronic small-vessel disease with mild for age ischemic gliosis throughout the bilateral cerebral hemispheres. Normal cerebral volume for age. No abnormal intracranial  enhancement. MRA HEAD FINDINGS Fetal type right PCA with hypoplastic or aplastic right A1 segment. Symmetric vertebral arteries. Extensive intracranial atherosclerosis. Upper right cervical carotid and siphon is patent. Aplastic or hypoplastic A1 segment on the right. There is diffuse moderate narrowing of the right M1 segment, approaching high-grade distally. Atherosclerotic irregularity of the median vessels throughout the right MCA branches. Left cervical carotid and carotid siphon are patent. There is stenosis at the supra clinoid ICA level which is high-grade, with mild poststenotic dilatation. 1 mm conical outpouching posteriorly from the left supraclinoid carotid artery is likely a infundibulum with parent vessel below the resolution of MRA. There is a focal mild to moderate stenosis of the distal left M1 segment. Scattered atherosclerotic type narrowing of left operculum branches, high-grade involving the proximal inferior division. Asymmetric poor flow throughout the right PCA with nonvisualized right P1 segment (at least partly from fetal type PCA persistence). There is also flow gap involving the proximal right superior cerebellar artery and despite small vessel size is convincing on source images. Focal moderate narrowing involving the left vertebral artery at the vertebrobasilar junction. Normal appearance of the basilar. No luminal based T1 hyperintensity involving the narrow vessels to suggest acute dissection. IMPRESSION: 1. No acute finding, including infarct. The left parietal abnormality on CT yesterday reflects remote small vessel infarct. 2. Extensive intracranial atherosclerosis, as described above. The most advanced and proximal stenoses involve the right PCA, right MCA, and left supra clinoid ICA. Electronically Signed   By: Monte Fantasia M.D.   On: 12/24/2014 09:22   US Carotid Bilateral  12/23/2014  CLINICAL DATA:  CVA.  History of syncopal episode and hypertension. EXAM: BILATERAL  CAROTID DUPLEX ULTRASOUND TECHNIQUE: Pearline Cables scale imaging, color Doppler and duplex ultrasound were performed of bilateral carotid and vertebral arteries in the neck. COMPARISON:  Head CT - 12/23/2014 FINDINGS: Criteria: Quantification of carotid stenosis is based on velocity parameters that correlate the residual internal carotid diameter with NASCET-based stenosis levels, using the diameter of the distal internal carotid lumen as the denominator for stenosis measurement. The following velocity measurements were obtained: RIGHT ICA:  93/22 cm/sec CCA:  123XX123 cm/sec SYSTOLIC ICA/CCA RATIO:  1.1 DIASTOLIC ICA/CCA RATIO:  1.6 ECA:  60 cm/sec LEFT ICA:  181/28 cm/sec CCA:  0000000 cm/sec SYSTOLIC ICA/CCA RATIO:  1.7 DIASTOLIC ICA/CCA RATIO:  1.2 ECA:  177 cm/sec RIGHT CAROTID ARTERY: There is a minimal amount of circumferential intimal thickening throughout the right common carotid artery (representative images 8, 12 and 16). There is a minimal amount intimal wall thickening within the right carotid bulb (image 20), extending to involve the origin and proximal aspect the right internal carotid artery (image 27), not resulting in elevated peak systolic velocities within the interrogated course of the right internal carotid artery to suggest a hemodynamically significant stenosis. RIGHT VERTEBRAL ARTERY:  Antegrade flow LEFT CAROTID ARTERY: There is a moderate amount of eccentric mixed echogenic plaque within the proximal and mid aspects of the left common carotid artery (representative images 39 and 43). There is a large amount of eccentric mixed echogenic partially shadowing plaque within the left carotid bulb (image 51), extending to involve the origin of  the left internal carotid artery (image 59), resulting in elevated peak systolic velocities within the proximal aspect of the left internal carotid artery (greatest acquired peak systolic velocity within the proximal left ICA measures 181 cm/sec - image 61). LEFT  VERTEBRAL ARTERY:  Antegrade flow Incidental note is made of an apparent transient cardiac arrhythmia (image 37). IMPRESSION: 1. Large amount of left-sided atherosclerotic plaque results in elevated peak systolic velocities within the left internal carotid artery compatible with the 50-69% luminal narrowing range. Further evaluation with CTA could be performed as clinically indicated. 2. Minimal amount of right-sided intimal wall thickening, not resulting in a hemodynamically significant stenosis. 3. Apparent transient cardiac arrhythmia. Further evaluation with ECG monitoring could be performed as clinically indicated. Electronically Signed   By: Sandi Mariscal M.D.   On: 12/23/2014 16:25    Positive ROS: All other systems have been reviewed and were otherwise negative with the exception of those mentioned in the HPI and as above.  Physical Exam: General: Alert, no acute distress  MUSCULOSKELETAL: Right lower extremity: Patient's skin is intact throughout the right lower extremity. There is no erythema or ecchymosis or swelling. The right knee demonstrates no effusion. She has no tenderness to palpation around the knee. She can fully extend her knee and perform a straight leg raise to approximately 45 without lag. She can flex the knee to approximately 95 without pain. She has no pain with knee range of motion. Patient's leg compartments are soft and compressible. She had no tenderness of the ankle. She can actively dorsiflex and plantarflex her ankle without pain. There is no ankle instability. She had no areas of point tenderness over the right foot or ankle. She has intact sensation light touch. Pedal pulses are difficult to palpate. Patient has mild tenderness over the anterior proximal tibia but no crepitus. There is no right lower extremity deformity.  Assessment: Right leg pain likely secondary to contusion with history of recent fall   Plan: Patient was complaining of mild right leg pain today.  It was not significant at rest. There is no erythema or ecchymosis or swelling on her exam today. Her leg compartments are soft and compressible and she has no evidence of compartment syndrome. Patient is able to move her knee and ankle joints without significant leg pain.  I reviewed the x-ray of the right knee which demonstrates tricompartmental osteoarthritis which is advanced. There is no evidence of fracture. Patient has significant medial compartment joint space narrowing with marginal osteophytes. I have also reviewed the right ankle x-rays which again show no evidence of fracture.  Between the right knee and right ankle films the tibia is visualized and I do not see evidence of fracture.   I recommend the patient proceed with physical therapy evaluation. She may weight-bear as tolerated on the right lower extremity at this point. If she has increasing pain in the right leg with weightbearing during physical therapy, further imaging, including CT scan and possible MRI, would be required to rule out an occult fracture. Per the patient's daughter her right knee effusion has significantly improved. She has likely responded to indomethacin and Solu-Medrol. Continue to elevate the leg when in bed. She may follow up with me on a when necessary basis. I will sign off for now. Please reconsult me if the patient has difficulty weightbearing with physical therapy.   Thornton Park, MD    12/25/2014 2:37 PM

## 2014-12-25 NOTE — Progress Notes (Signed)
[name]  Daily Progress Note   Subjective: Patient resting comfortably in bed with family at bedside. States the pain in her leg is improving. She has not been OOB yet. Explained to patient and family at beside she will not need an angiogram during his hospitalization. Encouraged follow up in our office after discharge.   Objective: Filed Vitals:   12/24/14 1355 12/24/14 2059 12/25/14 0504 12/25/14 0810  BP: 163/75 160/73 157/70 149/68  Pulse: 70 59 72 75  Temp: 97.7 F (36.5 C) 98.3 F (36.8 C) 98.1 F (36.7 C)   TempSrc: Oral Oral Oral   Resp:  18 18   Height:      Weight:      SpO2: 93% 97% 96%     Intake/Output Summary (Last 24 hours) at 12/25/14 1217 Last data filed at 12/25/14 0800  Gross per 24 hour  Intake    360 ml  Output      0 ml  Net    360 ml    Physical Exam: A&Ox3, NAD CV: RRR Pulmonary: CTA Bilaterally Abdomen: Soft, Nontender, Nondistended Vascular: Right Lower Extremity: palpable femoral pulses thigh warm - extremity becomes cooler distally. No cyanosis, no ulceration, no pallor, no mottling, non-palpable PT and DP pulses. Minimal edema. Left Lower Extremity: palpable femoral pulses thigh warm - extremity becomes cooler distally. No cyanosis, no ulceration, no pallor, no mottling, non-palpable PT and DP pulses. Minimal edema   Laboratory: CBC    Component Value Date/Time   WBC 6.9 12/24/2014 0445   WBC 5.8 09/03/2014 1128   WBC 11.4* 06/06/2014 1002   HGB 11.7* 12/24/2014 0445   HGB 12.3 06/06/2014 1002   HCT 34.6* 12/24/2014 0445   HCT 37.5 09/03/2014 1128   HCT 38.6 06/06/2014 1002   PLT 152 12/24/2014 0445   PLT 252 06/06/2014 1002    BMET    Component Value Date/Time   NA 140 12/24/2014 0445   NA 142 10/20/2014 1007   NA 139 06/06/2014 1002   K 3.8 12/24/2014 0445   K 4.0 06/06/2014 1002   CL 109 12/24/2014 0445   CL 105 06/06/2014 1002   CO2 26 12/24/2014 0445   CO2 24 06/06/2014 1002   GLUCOSE 118*  12/24/2014 0445   GLUCOSE 98 10/20/2014 1007   GLUCOSE 170* 06/06/2014 1002   BUN 18 12/24/2014 0445   BUN 19 10/20/2014 1007   BUN 34* 06/06/2014 1002   CREATININE 0.95 12/24/2014 0445   CREATININE 1.44* 06/06/2014 1002   CREATININE 1.2* 08/31/2013   CALCIUM 8.9 12/24/2014 0445   CALCIUM 9.0 06/06/2014 1002   GFRNONAA 52* 12/24/2014 0445   GFRNONAA 32* 06/06/2014 1002   GFRNONAA 36* 03/02/2014 0658   GFRAA 60* 12/24/2014 0445   GFRAA 49* 06/06/2014 1002   GFRAA 44* 03/02/2014 0658    Assessment/Planning: Katelyn [name] is a 81 y.o. female with a known history of hypertension, gout, chronic kidney disease stage III, Paget's disease who presented to the ED with right sided weakness and right lower extremity pain. No acute infarct on MRI. 1) Symptoms continue to improve - no acute vascular compromise to extremity, most likely chronic on acute ischemia 2) Recommend dual therapy with ASA and plavix 3) Patient will most likely not need an angiogram on Monday. She will need follow up in our office. Will place follow up information in her discharge summary.   [name] PA-C 12/25/2014 12:17 PM

## 2014-12-25 NOTE — Clinical Social Work Note (Signed)
Clinical Social Work Assessment  Patient Details  Name: Katelyn [name] MRN: [id number] Date of Birth: 1924/01/18  Date of referral:  12/25/14               Reason for consult:  Facility Placement                Permission sought to share information with:  Family Supports Permission granted to share information::  Yes, Verbal Permission Granted  Name::        Agency::     Relationship::     Contact Information:     Housing/Transportation Living arrangements for the past 2 months:  Single Family Home Source of Information:  Patient, Adult Children Patient Interpreter Needed:  None Criminal Activity/Legal Involvement Pertinent to Current Situation/Hospitalization:  No - Comment as needed Significant Relationships:  Adult Children Lives with:  Adult Children Do you feel safe going back to the place where you live?  Yes Need for family participation in patient care:  Yes (Comment)  Care giving concerns:  Patient lives with a family member: Katelyn [name].   Social Worker assessment / plan:  CSW met with patient and her daughter Katelyn [name] in her hospital room this afternoon. CSW discussed the PT recommendations with patient and her daughter. Patient immediately stated she was not going to a rehab facility. Patient states she is able walk with a cane at home and that her daughter is with her if she needs anything. CSW explained the placement process and that [facility] contracts [facility]. Patient's daughter, Katelyn [name], spoke up and stated she believes patient should go for a few days. As patient and her daughter continued to discuss it, the patient seemed to begin willing to consider it but was not ready to tell me yet that I could begin a bedsearch. CSW will need to follow up with patient and daughter to determine what patient's final decision will be. FL2 completed and placed on chart. PASRR will need to be completed.  Employment status:  Retired Nurse, adult PT  Recommendations:  [place] / Referral to community resources:     Patient/Family's Response to care:  Patient very reluctant to consider rehab.  Patient/Family's Understanding of and Emotional Response to Diagnosis, Current Treatment, and Prognosis:  Patient not willing to admit she may not be able to manage at home.  Emotional Assessment Appearance:  Appears stated age Attitude/Demeanor/Rapport:  Avoidant Affect (typically observed):  Appropriate Orientation:  Oriented to Self, Oriented to Place, Oriented to  Time, Oriented to Situation Alcohol / Substance use:  Not Applicable Psych involvement (Current and /or in the community):  No (Comment)  Discharge Needs  Concerns to be addressed:  Care Coordination Readmission within the last 30 days:  No Current discharge risk:  None Barriers to Discharge:  No Barriers Identified   Shela Leff, LCSW 12/25/2014, 3:21 PM

## 2014-12-25 NOTE — Progress Notes (Signed)
[facility] NAME: Katelyn [name]    MR#:  [id number]  DATE OF BIRTH:  1923-09-05  SUBJECTIVE:  CHIEF COMPLAINT:  pts knee swelling is better , but still c/o pain and daughter says pt is not moving much from knee and ankle pain   REVIEW OF SYSTEMS:  CONSTITUTIONAL: No fever, fatigue or weakness.  EYES: No blurred or double vision.  EARS, NOSE, AND THROAT: No tinnitus or ear pain.  RESPIRATORY: No cough, shortness of breath, wheezing or hemoptysis.  CARDIOVASCULAR: No chest pain, orthopnea, edema.  GASTROINTESTINAL: No nausea, vomiting, diarrhea or abdominal pain.  GENITOURINARY: No dysuria, hematuria.  ENDOCRINE: No polyuria, nocturia,  HEMATOLOGY: No anemia, easy bruising or bleeding SKIN: No rash or lesion. MUSCULOSKELETAL: rt knee joint and rt ankle pain , rt knee swelling is better  NEUROLOGIC: No tingling, numbness, weakness.  PSYCHIATRY: No anxiety or depression.   DRUG ALLERGIES:  No Known Allergies  VITALS:  Blood pressure 141/67, pulse 84, temperature 98.5 F (36.9 C), temperature source Oral, resp. rate 19, height 5\' 5"  (1.651 m), weight 81.194 kg (179 lb), SpO2 99 %.  PHYSICAL EXAMINATION:  GENERAL:  81 y.o.-year-old patient lying in the bed with no acute distress.  EYES: Pupils equal, round, reactive to light and accommodation. No scleral icterus. Extraocular muscles intact.  HEENT: Head atraumatic, normocephalic. Oropharynx and nasopharynx clear.  NECK:  Supple, no jugular venous distention. No thyroid enlargement, no tenderness.  LUNGS: Normal breath sounds bilaterally, no wheezing, rales,rhonchi or crepitation. No use of accessory muscles of respiration.  CARDIOVASCULAR: S1, S2 normal. No murmurs, rubs, or gallops.  ABDOMEN: Soft, nontender, nondistended. Bowel sounds present. No organomegaly or mass.  EXTREMITIES: rt  Knee with improved effusion and less tender, pt is gaurding to move rt knee, rt ankle  rom is intact No pedal edema, cyanosis, or clubbing.  NEUROLOGIC: Cranial nerves II through XII are intact. Muscle strength 5/5 in all extremities. Sensation intact. Gait not checked.  PSYCHIATRIC: The patient is alert and oriented x 3.  SKIN: No obvious rash, lesion, or ulcer.    LABORATORY PANEL:   CBC  Recent Labs Lab 12/24/14 0445  WBC 6.9  HGB 11.7*  HCT 34.6*  PLT 152   ------------------------------------------------------------------------------------------------------------------  Chemistries   Recent Labs Lab 12/23/14 0949 12/24/14 0445  NA 140 140  K 3.6 3.8  CL 108 109  CO2 23 26  GLUCOSE 153* 118*  BUN 21* 18  CREATININE 1.18* 0.95  CALCIUM 8.9 8.9  AST 19  --   ALT 9*  --   ALKPHOS 56  --   BILITOT 0.6  --    ------------------------------------------------------------------------------------------------------------------  Cardiac Enzymes  Recent Labs Lab 12/24/14 0141  TROPONINI 0.03   ------------------------------------------------------------------------------------------------------------------  RADIOLOGY:  Dg Knee 1-2 Views Right  12/25/2014  CLINICAL DATA:  Right knee and right ankle pain with no known injury. EXAM: RIGHT KNEE - 1-2 VIEW COMPARISON:  None. FINDINGS: No fracture. There is narrowing of the medial patellofemoral joint space compartments with marginal osteophytes from all 3 compartments. Ossified/calcified densities are seen posteriorly. There is no joint effusion. Bones are demineralized. Soft tissues are unremarkable. IMPRESSION: 1. No fracture or acute finding. 2. Osteoarthritis.  Probable posterior intra-articular bodies. Electronically Signed   By: Lajean Manes M.D.   On: 12/25/2014 14:16   Dg Ankle Complete Right  12/25/2014  CLINICAL DATA:  Right ankle pain, no known injury EXAM: RIGHT ANKLE - COMPLETE 3+ VIEW  COMPARISON:  None. FINDINGS: Three views of the right ankle submitted. No acute fracture or subluxation. Ankle  mortise is preserved. IMPRESSION: Negative. Electronically Signed   By: Lahoma Crocker M.D.   On: 12/25/2014 14:21   Mr Jodene Nam Head Wo Contrast  12/24/2014  CLINICAL DATA:  Stroke with right leg numbness below the knee. EXAM: MRI HEAD WITHOUT AND WITH CONTRAST MRA HEAD WITHOUT CONTRAST TECHNIQUE: Multiplanar, multiecho pulse sequences of the brain and surrounding structures were obtained without and with intravenous contrast. Angiographic images of the head were obtained using MRA technique without contrast. CONTRAST:  11mL MULTIHANCE GADOBENATE DIMEGLUMINE 529 MG/ML IV SOLN COMPARISON:  Head CT from yesterday FINDINGS: MRI HEAD FINDINGS Calvarium and upper cervical spine: No focal marrow signal abnormality. Orbits: No significant findings. Sinuses and Mastoids: Clear. Brain: No acute abnormality such as acute infarct, hemorrhage, hydrocephalus, or mass lesion. No evidence of large vessel occlusion. The left parietal low-density on previous head CT reflects cortically based gliosis compatible with remote small vessel infarct. Chronic small-vessel disease with mild for age ischemic gliosis throughout the bilateral cerebral hemispheres. Normal cerebral volume for age. No abnormal intracranial enhancement. MRA HEAD FINDINGS Fetal type right PCA with hypoplastic or aplastic right A1 segment. Symmetric vertebral arteries. Extensive intracranial atherosclerosis. Upper right cervical carotid and siphon is patent. Aplastic or hypoplastic A1 segment on the right. There is diffuse moderate narrowing of the right M1 segment, approaching high-grade distally. Atherosclerotic irregularity of the median vessels throughout the right MCA branches. Left cervical carotid and carotid siphon are patent. There is stenosis at the supra clinoid ICA level which is high-grade, with mild poststenotic dilatation. 1 mm conical outpouching posteriorly from the left supraclinoid carotid artery is likely a infundibulum with parent vessel below the  resolution of MRA. There is a focal mild to moderate stenosis of the distal left M1 segment. Scattered atherosclerotic type narrowing of left operculum branches, high-grade involving the proximal inferior division. Asymmetric poor flow throughout the right PCA with nonvisualized right P1 segment (at least partly from fetal type PCA persistence). There is also flow gap involving the proximal right superior cerebellar artery and despite small vessel size is convincing on source images. Focal moderate narrowing involving the left vertebral artery at the vertebrobasilar junction. Normal appearance of the basilar. No luminal based T1 hyperintensity involving the narrow vessels to suggest acute dissection. IMPRESSION: 1. No acute finding, including infarct. The left parietal abnormality on CT yesterday reflects remote small vessel infarct. 2. Extensive intracranial atherosclerosis, as described above. The most advanced and proximal stenoses involve the right PCA, right MCA, and left supra clinoid ICA. Electronically Signed   By: Monte Fantasia M.D.   On: 12/24/2014 09:22   Mr Jeri Cos [id number] Contrast  12/24/2014  CLINICAL DATA:  Stroke with right leg numbness below the knee. EXAM: MRI HEAD WITHOUT AND WITH CONTRAST MRA HEAD WITHOUT CONTRAST TECHNIQUE: Multiplanar, multiecho pulse sequences of the brain and surrounding structures were obtained without and with intravenous contrast. Angiographic images of the head were obtained using MRA technique without contrast. CONTRAST:  106mL MULTIHANCE GADOBENATE DIMEGLUMINE 529 MG/ML IV SOLN COMPARISON:  Head CT from yesterday FINDINGS: MRI HEAD FINDINGS Calvarium and upper cervical spine: No focal marrow signal abnormality. Orbits: No significant findings. Sinuses and Mastoids: Clear. Brain: No acute abnormality such as acute infarct, hemorrhage, hydrocephalus, or mass lesion. No evidence of large vessel occlusion. The left parietal low-density on previous head CT reflects  cortically based gliosis compatible with remote small vessel infarct.  Chronic small-vessel disease with mild for age ischemic gliosis throughout the bilateral cerebral hemispheres. Normal cerebral volume for age. No abnormal intracranial enhancement. MRA HEAD FINDINGS Fetal type right PCA with hypoplastic or aplastic right A1 segment. Symmetric vertebral arteries. Extensive intracranial atherosclerosis. Upper right cervical carotid and siphon is patent. Aplastic or hypoplastic A1 segment on the right. There is diffuse moderate narrowing of the right M1 segment, approaching high-grade distally. Atherosclerotic irregularity of the median vessels throughout the right MCA branches. Left cervical carotid and carotid siphon are patent. There is stenosis at the supra clinoid ICA level which is high-grade, with mild poststenotic dilatation. 1 mm conical outpouching posteriorly from the left supraclinoid carotid artery is likely a infundibulum with parent vessel below the resolution of MRA. There is a focal mild to moderate stenosis of the distal left M1 segment. Scattered atherosclerotic type narrowing of left operculum branches, high-grade involving the proximal inferior division. Asymmetric poor flow throughout the right PCA with nonvisualized right P1 segment (at least partly from fetal type PCA persistence). There is also flow gap involving the proximal right superior cerebellar artery and despite small vessel size is convincing on source images. Focal moderate narrowing involving the left vertebral artery at the vertebrobasilar junction. Normal appearance of the basilar. No luminal based T1 hyperintensity involving the narrow vessels to suggest acute dissection. IMPRESSION: 1. No acute finding, including infarct. The left parietal abnormality on CT yesterday reflects remote small vessel infarct. 2. Extensive intracranial atherosclerosis, as described above. The most advanced and proximal stenoses involve the right PCA,  right MCA, and left supra clinoid ICA. Electronically Signed   By: Monte Fantasia M.D.   On: 12/24/2014 09:22    EKG:   Orders placed or performed during the hospital encounter of 12/23/14  . ED EKG  . ED EKG  . EKG 12-Lead  . EKG 12-Lead    ASSESSMENT AND PLAN:   #1 acute CVA: ruled out with neg MRI MRI with no acute CVA but revealed atherosclerosis telemetry.   2-D echocardiogram lvef 55-60%  no trend of cycled cardiac enzymes.   physical therapy consultation- recomends snf.   Neurology recommended asa and plavix for significant attherosclerosis  Lt ICA stenosis 50-69% - vascular recs OP f/u and CTA prn   #2 chronic ischemic right lower extremity:   cold to touch, unable to find pulse with Doppler.   vascular surgery recommended op f/u as its chronic, d/w dr.Dew, op angiogram  #3 acute Rt knee pain with effusion , elevated uric acid- prob acute gouty arthritis of rt knee/ tender from recent fall Significant improvement with Solumedrol and Indocin, but still pt reports pain and reluctant to get OOB XRAY knee and ankle with no fractures D/c solumedrol, start po prednisone - for 2- 3 days and stop  PT eval- recs SNF  34  hypertension: Blood pressure stable . Will resume  antihypertensives and titrate prn  #4 chronic kidney disease stage III: Baseline creatinine 1.15, close to baseline at this time. Stable  #5 hyperglycemia:  hemoglobin A1c- 5.7   NO other investigation needed at this time   # Disposition: PT recommends SNF, f/u with sw  All the records are reviewed and case discussed with Care Management/Social Workerr. Management plans discussed with the patient, family and they are in agreement.  CODE STATUS: FULL  TOTAL TIME TAKING CARE OF THIS PATIENT: 35 minutes.   POSSIBLE D/C IN 1-2 DAYS, DEPENDING ON CLINICAL CONDITION.   Nicholes Mango M.D on 12/25/2014 at  5:28 PM  Between 7am to 6pm - Pager - [phone] After 6pm go to [email or web address] - password [email or web address]  [facility]  Office  [phone]  CC: Primary care physician; Pcp Not In System

## 2014-12-26 MED ORDER — INDOMETHACIN 50 MG PO CAPS: 50.0000 mg | ORAL_CAPSULE | Freq: Two times a day (BID) | ORAL | Status: AC

## 2014-12-26 MED ORDER — ASPIRIN 81 MG PO CHEW: 81.0000 mg | CHEWABLE_TABLET | Freq: Every day | ORAL | Status: DC

## 2014-12-26 MED ORDER — PRAVASTATIN SODIUM 20 MG PO TABS: 20.0000 mg | ORAL_TABLET | Freq: Every day | ORAL | Status: DC

## 2014-12-26 MED ORDER — CLOPIDOGREL BISULFATE 75 MG PO TABS: 75.0000 mg | ORAL_TABLET | Freq: Every day | ORAL | Status: DC

## 2014-12-26 MED ORDER — METOPROLOL TARTRATE 25 MG PO TABS
25.0000 mg | ORAL_TABLET | Freq: Two times a day (BID) | ORAL | Status: DC
  Filled 2014-12-26: qty 1

## 2014-12-26 MED ORDER — METOPROLOL TARTRATE 25 MG PO TABS
25.0000 mg | ORAL_TABLET | Freq: Two times a day (BID) | ORAL | Status: DC
  Administered 2014-12-26: 25 mg via ORAL
  Filled 2014-12-26: qty 1

## 2014-12-26 MED ORDER — AMLODIPINE BESYLATE 5 MG PO TABS
5.0000 mg | ORAL_TABLET | Freq: Every day | ORAL | Status: DC
  Administered 2014-12-27: 10:00:00 5 mg via ORAL
  Filled 2014-12-26: qty 1

## 2014-12-26 NOTE — Plan of Care (Signed)
Problem: Discharge/Transitional Outcomes Goal: Other Discharge Outcomes/Goals Outcome: Progressing Patient c/o right leg, knee and ankle pain x2 relieved well with prn Tylenol and Percocet  VSS, except HR on ambulation on unit went as high at 143 on ambulation and back down to WNL resting, Dr Margaretmary Eddy aware, new order for metoprolol BID to start tonight, amlodipine changed to 5mg  daily  Daughter at bedside   Patient is tolerating diet  Possible d/c tomorrow

## 2014-12-26 NOTE — Care Management Note (Signed)
Case Management Note  Patient Details  Name: Katelyn [name] MRN: [id number] Date of Birth: 1923-09-11  Subjective/Objective:    [facility] was given a telephone update per Ms Sahagian and her family request discharge back home and to follow-up with [facility]ACE. Per Dr Margaretmary Eddy Ms Kobus is negative for a CVA. Peripheral vascular disease is chronic, but recommendation is for outpatient follow up with Dr Lucky Cowboy in Vascular Surgery.                 Action/Plan:   Expected Discharge Date:                  Expected Discharge Plan:     In-House Referral:     Discharge planning Services     Post Acute Care Choice:    Choice offered to:     DME Arranged:    DME Agency:     HH Arranged:    [place] Agency:     Status of Service:     Medicare Important Message Given:    Date Medicare IM Given:    Medicare IM give by:    Date Additional Medicare IM Given:    Additional Medicare Important Message give by:     If discussed at [place] of Stay Meetings, dates discussed:    Additional Comments:  Yousef Huge A, RN 12/26/2014, 11:39 AM

## 2014-12-26 NOTE — Progress Notes (Signed)
Called Dr Berline Lopes patient HR wnt to 139 when ambulating outside room, per dr Berline Lopes non new orders continue to monitor

## 2014-12-26 NOTE — Care Management Note (Addendum)
Case Management Note  Patient Details  Name: Katelyn [name] MRN: [id number] Date of Birth: 15-Oct-1923  Subjective/Objective:         No discharge order at this time. Katelyn [name] resides at home with her daughter and is followed in the community by the [facility] for medical care and medications. Case Management will notify the [facility] when Katelyn [name] is discharged. Sticky note to physician that Katelyn [name] needs a rolling walker at discharge.          Action/Plan:   Expected Discharge Date:                  Expected Discharge Plan:     In-House Referral:     Discharge planning Services     Post Acute Care Choice:    Choice offered to:     DME Arranged:    DME Agency:     HH Arranged:    [place] Agency:     Status of Service:     Medicare Important Message Given:    Date Medicare IM Given:    Medicare IM give by:    Date Additional Medicare IM Given:    Additional Medicare Important Message give by:     If discussed at [place] of Stay Meetings, dates discussed:    Additional Comments:  Jhonnie Aliano A, RN 12/26/2014, 11:00 AM

## 2014-12-26 NOTE — Plan of Care (Signed)
Problem: Discharge/Transitional Outcomes Goal: Other Discharge Outcomes/Goals Outcome: Progressing Plan of care progress to goal: Barrier to progression- pt's daughter stated that pt refused to go to rehab 12/25/14, daughter is aware and will be at bedside to discuss with care management today.  Pain-no c/o pain this shift.  Educational plan- continue to assess need for education, educate as needed.  Independent mobility- pt continues to need assistance while up OOB, ambulates with a rolling walker with standby assist to BR.  Diet: tolerating. No c/o nausea or vomiting this shift.  Possible discharge over the weekend with [facility] program to start on Monday

## 2014-12-26 NOTE — Progress Notes (Signed)
Katelyn [name] [facility] Vascular Surgery  Daily Progress Note   Subjective: Family at bedside. Patient resting comfortably in bed. Pain controlled with pain medication. Patient up and out of bed with assistance. Patient to be discharged to rehab either today or tomorrow by primary team. Had another discussion in regard to follow up in our office in one week to undergo additional tests with possible angiogram in the future. Patient to continue ASA and Plavix.   Objective: Filed Vitals:   12/25/14 2100 12/26/14 0523 12/26/14 1041 12/26/14 1110  BP: 129/99 157/71 140/79 149/70  Pulse: 91 75  83  Temp: 98.6 F (37 C) 98.1 F (36.7 C) 98 F (36.7 C)   TempSrc: Oral Oral Oral   Resp: 18 18 18 18   Height:      Weight:      SpO2: 99% 100% 100%     Intake/Output Summary (Last 24 hours) at 12/26/14 1255 Last data filed at 12/26/14 1014  Gross per 24 hour  Intake    480 ml  Output      0 ml  Net    480 ml    Physical Exam: A&Ox3, NAD CV: RRR Pulmonary: CTA Bilaterally Abdomen: Soft, Nontender, Nondistended Vascular: Right Lower Extremity: palpable femoral pulses thigh warm - extremity becomes cooler distally. No cyanosis, no ulceration, no pallor, no mottling, non-palpable PT and DP pulses. Minimal edema. Motor and neuro intact.  Left Lower Extremity: palpable femoral pulses thigh warm - extremity becomes cooler distally. No cyanosis, no ulceration, no pallor, no mottling, non-palpable PT and DP pulses. Minimal edema. Motor and neuro intact.   Laboratory: CBC    Component Value Date/Time   WBC 6.9 12/24/2014 0445   WBC 5.8 09/03/2014 1128   WBC 11.4* 06/06/2014 1002   HGB 11.7* 12/24/2014 0445   HGB 12.3 06/06/2014 1002   HCT 34.6* 12/24/2014 0445   HCT 37.5 09/03/2014 1128   HCT 38.6 06/06/2014 1002   PLT 152 12/24/2014 0445   PLT 252 06/06/2014 1002    BMET    Component Value Date/Time   NA 140 12/24/2014 0445   NA 142 10/20/2014 1007   NA 139 06/06/2014 1002    K 3.8 12/24/2014 0445   K 4.0 06/06/2014 1002   CL 109 12/24/2014 0445   CL 105 06/06/2014 1002   CO2 26 12/24/2014 0445   CO2 24 06/06/2014 1002   GLUCOSE 118* 12/24/2014 0445   GLUCOSE 98 10/20/2014 1007   GLUCOSE 170* 06/06/2014 1002   BUN 18 12/24/2014 0445   BUN 19 10/20/2014 1007   BUN 34* 06/06/2014 1002   CREATININE 0.95 12/24/2014 0445   CREATININE 1.44* 06/06/2014 1002   CREATININE 1.2* 08/31/2013   CALCIUM 8.9 12/24/2014 0445   CALCIUM 9.0 06/06/2014 1002   GFRNONAA 52* 12/24/2014 0445   GFRNONAA 32* 06/06/2014 1002   GFRNONAA 36* 03/02/2014 0658   GFRAA 60* 12/24/2014 0445   GFRAA 42* 06/06/2014 1002   GFRAA 44* 03/02/2014 0658    Assessment/Planning: Katelyn [name] is a 81 y.o. female with a known history of hypertension, gout, chronic kidney disease stage III, Paget's disease who presented to the ED with right sided weakness and right lower extremity pain. No acute infarct on MRI. 1) Symptoms continue to improve - no acute vascular compromise to extremity, most likely chronic on acute ischemia 2) Recommend dual therapy with ASA and plavix 3) Patient not need an angiogram during this inpatient stay. She will need follow up in our office. Will  place follow up information in her discharge summary.   Katelyn Overlie PA-C 12/26/2014 12:55 PM

## 2014-12-26 NOTE — Progress Notes (Signed)
Talked Dr Berline Lopes per partient had 5 beat run of SVT and some PVC, MD acknowledged no new orders

## 2014-12-26 NOTE — Discharge Summary (Signed)
[place] [facility] [place]: Katelyn [name]    MR#:  [id number]  DATE OF BIRTH:  10-17-1923  DATE OF ADMISSION:  12/23/2014 ADMITTING PHYSICIAN: Aldean Jewett, MD  DATE OF DISCHARGE: 12/26/2014 PRIMARY CARE PHYSICIAN: Pcp Not In System    ADMISSION DIAGNOSIS:  CVA (cerebral infarction) [I63.9] Acute ischemic stroke ([place]) [I63.9]  DISCHARGE DIAGNOSIS:  Significant atherosclerosis Acute right knee pain  SECONDARY DIAGNOSIS:   Past Medical History  Diagnosis Date  . Hypertension   . Chronic kidney disease   . Paget's disease of bone   . Gout     HOSPITAL COURSE:   #1 ruled out acute CVA: ruled out with neg MRI MRI with no acute CVA but revealed atherosclerosis telemetry.  2-D echocardiogram lvef 55-60% no trend of cycled cardiac enzymes.  physical therapy consultation- recomends snf. Patient prefers to go home and [facility] for [facility] of PT Neurology recommended asa and plavix for significant attherosclerosis  Lt ICA stenosis 50-69% - vascular recs OP f/u and CTA prn   #2 chronic ischemic right lower extremity:  cold to touch, unable to find pulse with Doppler.  vascular surgery recommended op f/u as its chronic, d/w dr.Dew, op angiogram  #3 acute Rt knee pain with effusion , elevated uric acid- prob acute gouty arthritis of rt knee/ tender from recent fall Significant improvement with Solumedrol and Indocin, tapered to by mouth prednisone. Will discontinue prednisone at the time of discharge and continue Indocin for 4 more days Outpatient follow-up with rheumatology is recommended XRAY knee and ankle with no fractures  start po prednisone - for 2- 3 days and stop  PT eval- recs SNF, patient wants to [facility] with [facility] as an outpatient  34 hypertension: Blood pressure stable . Will resume antihypertensives and titrate prn  #4 chronic kidney disease stage III:  Baseline creatinine 1.15, creatinine in the normal range  #5 hyperglycemia: hemoglobin A1c- 5.7  NO other investigation needed at this time   # Disposition: PT recommends SNF, the patient and family wants to take her home and follow-up with [facility] as an outpatient from tomorrow  DISCHARGE CONDITIONS:   fair  CONSULTS OBTAINED:  Treatment Team:  Algernon Huxley, MD [name]., MD [place], MD   PROCEDURES none  DRUG ALLERGIES:  No Known Allergies  DISCHARGE MEDICATIONS:   Current Discharge Medication List    START taking these medications   Details  aspirin 81 MG chewable tablet Chew 1 tablet (81 mg total) by mouth daily.    clopidogrel (PLAVIX) 75 MG tablet Take 1 tablet (75 mg total) by mouth daily. Qty: 30 tablet, Refills: 0    indomethacin (INDOCIN) 50 MG capsule Take 1 capsule (50 mg total) by mouth 2 (two) times daily with a meal. Qty: 10 capsule, Refills: 0    pravastatin (PRAVACHOL) 20 MG tablet Take 1 tablet (20 mg total) by mouth daily at 6 PM. Qty: 30 tablet, Refills: 0      CONTINUE these medications which have NOT CHANGED   Details  amLODipine (NORVASC) 10 MG tablet TAKE ONE TABLET EVERY DAY (FOR BLOOD PRESSURE) Qty: 30 tablet, Refills: 5    hydrocortisone (ANUSOL-HC) 2.5 % rectal cream Place 1 application rectally 2 (two) times daily as needed for hemorrhoids.    losartan (COZAAR) 50 MG tablet Take 50 mg by mouth daily.    polyethylene glycol (MIRALAX / GLYCOLAX) packet Take 17 g by  mouth daily.    fluticasone (FLONASE) 50 MCG/ACT nasal spray Place 2 sprays into both nostrils daily. Qty: 16 g, Refills: 5    LORazepam (ATIVAN) 1 MG tablet Take 1 tablet (1 mg total) by mouth 2 (two) times daily. Qty: 14 tablet, Refills: 0    ondansetron (ZOFRAN) 4 MG tablet Take 1 tablet (4 mg total) by mouth every 8 (eight) hours as needed for nausea or vomiting. Qty: 20 tablet, Refills: 1         DISCHARGE INSTRUCTIONS:   Activity as  tolerated, as recommended by physical therapy. Ambulate with walker  Diet healthy heart Follow-up with primary care physician at [facility]  Tomorrow Continue outpatient rehabilitation at [facility] Follow-up with Neurology Dr. Brigitte Pulse in a month Follow-up with orthopedics as needed basis Follow-up with rheumatology for gout in 1 week Follow-up with Dr. Marzetta Board in a week     DIET:  Cardiac diet  DISCHARGE CONDITION:  Fair  ACTIVITY:  Activity as tolerated as per PT recommendations, with walker  OXYGEN:  Home Oxygen: No.   Oxygen Delivery: room air  DISCHARGE LOCATION:  home , to continue [facility] from tomorrow  If you experience worsening of your admission symptoms, develop shortness of breath, life threatening emergency, suicidal or homicidal thoughts you must seek medical attention immediately by calling 911 or calling your MD immediately  if symptoms less severe.  You Must read complete instructions/literature along with all the possible adverse reactions/side effects for all the Medicines you take and that have been prescribed to you. Take any new Medicines after you have completely understood and accpet all the possible adverse reactions/side effects.   Please note  You were cared for by a hospitalist during your hospital stay. If you have any questions about your discharge medications or the care you received while you were in the hospital after you are discharged, you can call the unit and asked to speak with the hospitalist on call if the hospitalist that took care of you is not available. Once you are discharged, your primary care physician will handle any further medical issues. Please note that NO REFILLS for any discharge medications will be authorized once you are discharged, as it is imperative that you return to your primary care physician (or establish a relationship with a primary care physician if you do not have one) for your aftercare needs so that they can  reassess your need for medications and monitor your lab values.     Today  Chief Complaint  Patient presents with  . Fall  . Leg Pain   Patient is feeling fine today. No complaints. Ambulating with the help of walker. Daughters at bedside  ROS:  CONSTITUTIONAL: Denies fevers, chills. Denies any fatigue, weakness.  EYES: Denies blurry vision, double vision, eye pain. EARS, NOSE, THROAT: Denies tinnitus, ear pain, hearing loss. RESPIRATORY: Denies cough, wheeze, shortness of breath.  CARDIOVASCULAR: Denies chest pain, palpitations, edema.  GASTROINTESTINAL: Denies nausea, vomiting, diarrhea, abdominal pain. Denies bright red blood per rectum. GENITOURINARY: Denies dysuria, hematuria. ENDOCRINE: Denies nocturia or thyroid problems. HEMATOLOGIC AND LYMPHATIC: Denies easy bruising or bleeding. SKIN: Denies rash or lesion. MUSCULOSKELETAL: Denies pain in neck, back, shoulder, knees, hips or arthritic symptoms.  NEUROLOGIC: Denies paralysis, paresthesias.  PSYCHIATRIC: Denies anxiety or depressive symptoms.   VITAL SIGNS:  Blood pressure 157/71, pulse 75, temperature 98.1 F (36.7 C), temperature source Oral, resp. rate 18, height 5\' 5"  (1.651 m), weight 81.194 kg (179 lb), SpO2 100 %.  I/O:   Intake/Output Summary (Last 24 hours) at 12/26/14 0959 Last data filed at 12/25/14 1700  Gross per 24 hour  Intake    360 ml  Output      0 ml  Net    360 ml    PHYSICAL EXAMINATION:  GENERAL:  81 y.o.-year-old patient lying in the bed with no acute distress.  EYES: Pupils equal, round, reactive to light and accommodation. No scleral icterus. Extraocular muscles intact.  HEENT: Head atraumatic, normocephalic. Oropharynx and nasopharynx clear.  NECK:  Supple, no jugular venous distention. No thyroid enlargement, no tenderness.  LUNGS: Normal breath sounds bilaterally, no wheezing, rales,rhonchi or crepitation. No use of accessory muscles of respiration.  CARDIOVASCULAR: S1, S2 normal.  No murmurs, rubs, or gallops.  ABDOMEN: Soft, non-tender, non-distended. Bowel sounds present. No organomegaly or mass.  EXTREMITIES: No pedal edema, cyanosis, or clubbing. Right knee tenderness and effusion  significantly improved NEUROLOGIC: Cranial nerves II through XII are intact. Muscle strength at her baseline. Sensation intact. Gait not checked.  PSYCHIATRIC: The patient is alert and oriented x 3.  SKIN: No obvious rash, lesion, or ulcer.   DATA REVIEW:   CBC  Recent Labs Lab 12/24/14 0445  WBC 6.9  HGB 11.7*  HCT 34.6*  PLT 152    Chemistries   Recent Labs Lab 12/23/14 0949 12/24/14 0445  NA 140 140  K 3.6 3.8  CL 108 109  CO2 23 26  GLUCOSE 153* 118*  BUN 21* 18  CREATININE 1.18* 0.95  CALCIUM 8.9 8.9  AST 19  --   ALT 9*  --   ALKPHOS 56  --   BILITOT 0.6  --     Cardiac Enzymes  Recent Labs Lab 12/24/14 0141  TROPONINI 0.03    Microbiology Results  Results for orders placed or performed during the hospital encounter of 10/13/14  Urine culture     Status: None   Collection Time: 10/13/14  2:15 PM  Result Value Ref Range Status   Specimen Description URINE, CLEAN CATCH  Final   Special Requests NONE  Final   Culture INSIGNIFICANT GROWTH  Final   Report Status 10/14/2014 FINAL  Final    RADIOLOGY:  Dg Knee 1-2 Views Right  12/25/2014  CLINICAL DATA:  Right knee and right ankle pain with no known injury. EXAM: RIGHT KNEE - 1-2 VIEW COMPARISON:  None. FINDINGS: No fracture. There is narrowing of the medial patellofemoral joint space compartments with marginal osteophytes from all 3 compartments. Ossified/calcified densities are seen posteriorly. There is no joint effusion. Bones are demineralized. Soft tissues are unremarkable. IMPRESSION: 1. No fracture or acute finding. 2. Osteoarthritis.  Probable posterior intra-articular bodies. Electronically Signed   By: Lajean Manes M.D.   On: 12/25/2014 14:16   Dg Ankle Complete Right  12/25/2014   CLINICAL DATA:  Right ankle pain, no known injury EXAM: RIGHT ANKLE - COMPLETE 3+ VIEW COMPARISON:  None. FINDINGS: Three views of the right ankle submitted. No acute fracture or subluxation. Ankle mortise is preserved. IMPRESSION: Negative. Electronically Signed   By: Lahoma Crocker M.D.   On: 12/25/2014 14:21   Ct Head Wo Contrast  12/23/2014  CLINICAL DATA:  Pain following fall. Right lower extremity pain and numbness EXAM: CT HEAD WITHOUT CONTRAST TECHNIQUE: Contiguous axial images were obtained from the base of the skull through the vertex without intravenous contrast. COMPARISON:  March 02, 2014 FINDINGS: There is stable age related volume loss. There is no intracranial mass, hemorrhage,  extra-axial fluid collection, or midline shift. In comparison with the prior study, there is a focal somewhat wedge-shaped area of decreased attenuation the left temporal -parietal junction, suspicious for a focal acute infarct. This finding is best appreciated on axial slices 19 and 20, series 2. Elsewhere there is slight small vessel disease in the centra semiovale bilaterally. Bony calvarium appears intact. Visualized mastoid air cells are clear. IMPRESSION: Findings felt to represent a small acute infarct of the left peripheral temporal -parietal junction. Elsewhere there is age related volume loss with slight periventricular small vessel disease. No hemorrhage or apparent mass. Electronically Signed   By: Lowella Grip III M.D.   On: 12/23/2014 10:08   Mr Jodene Nam Head Wo Contrast  12/24/2014  CLINICAL DATA:  Stroke with right leg numbness below the knee. EXAM: MRI HEAD WITHOUT AND WITH CONTRAST MRA HEAD WITHOUT CONTRAST TECHNIQUE: Multiplanar, multiecho pulse sequences of the brain and surrounding structures were obtained without and with intravenous contrast. Angiographic images of the head were obtained using MRA technique without contrast. CONTRAST:  79mL MULTIHANCE GADOBENATE DIMEGLUMINE 529 MG/ML IV SOLN  COMPARISON:  Head CT from yesterday FINDINGS: MRI HEAD FINDINGS Calvarium and upper cervical spine: No focal marrow signal abnormality. Orbits: No significant findings. Sinuses and Mastoids: Clear. Brain: No acute abnormality such as acute infarct, hemorrhage, hydrocephalus, or mass lesion. No evidence of large vessel occlusion. The left parietal low-density on previous head CT reflects cortically based gliosis compatible with remote small vessel infarct. Chronic small-vessel disease with mild for age ischemic gliosis throughout the bilateral cerebral hemispheres. Normal cerebral volume for age. No abnormal intracranial enhancement. MRA HEAD FINDINGS Fetal type right PCA with hypoplastic or aplastic right A1 segment. Symmetric vertebral arteries. Extensive intracranial atherosclerosis. Upper right cervical carotid and siphon is patent. Aplastic or hypoplastic A1 segment on the right. There is diffuse moderate narrowing of the right M1 segment, approaching high-grade distally. Atherosclerotic irregularity of the median vessels throughout the right MCA branches. Left cervical carotid and carotid siphon are patent. There is stenosis at the supra clinoid ICA level which is high-grade, with mild poststenotic dilatation. 1 mm conical outpouching posteriorly from the left supraclinoid carotid artery is likely a infundibulum with parent vessel below the resolution of MRA. There is a focal mild to moderate stenosis of the distal left M1 segment. Scattered atherosclerotic type narrowing of left operculum branches, high-grade involving the proximal inferior division. Asymmetric poor flow throughout the right PCA with nonvisualized right P1 segment (at least partly from fetal type PCA persistence). There is also flow gap involving the proximal right superior cerebellar artery and despite small vessel size is convincing on source images. Focal moderate narrowing involving the left vertebral artery at the vertebrobasilar  junction. Normal appearance of the basilar. No luminal based T1 hyperintensity involving the narrow vessels to suggest acute dissection. IMPRESSION: 1. No acute finding, including infarct. The left parietal abnormality on CT yesterday reflects remote small vessel infarct. 2. Extensive intracranial atherosclerosis, as described above. The most advanced and proximal stenoses involve the right PCA, right MCA, and left supra clinoid ICA. Electronically Signed   By: Monte Fantasia M.D.   On: 12/24/2014 09:22   Mr Jeri Cos [id number] Contrast  12/24/2014  CLINICAL DATA:  Stroke with right leg numbness below the knee. EXAM: MRI HEAD WITHOUT AND WITH CONTRAST MRA HEAD WITHOUT CONTRAST TECHNIQUE: Multiplanar, multiecho pulse sequences of the brain and surrounding structures were obtained without and with intravenous contrast. Angiographic images of the head were obtained  using MRA technique without contrast. CONTRAST:  83mL MULTIHANCE GADOBENATE DIMEGLUMINE 529 MG/ML IV SOLN COMPARISON:  Head CT from yesterday FINDINGS: MRI HEAD FINDINGS Calvarium and upper cervical spine: No focal marrow signal abnormality. Orbits: No significant findings. Sinuses and Mastoids: Clear. Brain: No acute abnormality such as acute infarct, hemorrhage, hydrocephalus, or mass lesion. No evidence of large vessel occlusion. The left parietal low-density on previous head CT reflects cortically based gliosis compatible with remote small vessel infarct. Chronic small-vessel disease with mild for age ischemic gliosis throughout the bilateral cerebral hemispheres. Normal cerebral volume for age. No abnormal intracranial enhancement. MRA HEAD FINDINGS Fetal type right PCA with hypoplastic or aplastic right A1 segment. Symmetric vertebral arteries. Extensive intracranial atherosclerosis. Upper right cervical carotid and siphon is patent. Aplastic or hypoplastic A1 segment on the right. There is diffuse moderate narrowing of the right M1 segment, approaching  high-grade distally. Atherosclerotic irregularity of the median vessels throughout the right MCA branches. Left cervical carotid and carotid siphon are patent. There is stenosis at the supra clinoid ICA level which is high-grade, with mild poststenotic dilatation. 1 mm conical outpouching posteriorly from the left supraclinoid carotid artery is likely a infundibulum with parent vessel below the resolution of MRA. There is a focal mild to moderate stenosis of the distal left M1 segment. Scattered atherosclerotic type narrowing of left operculum branches, high-grade involving the proximal inferior division. Asymmetric poor flow throughout the right PCA with nonvisualized right P1 segment (at least partly from fetal type PCA persistence). There is also flow gap involving the proximal right superior cerebellar artery and despite small vessel size is convincing on source images. Focal moderate narrowing involving the left vertebral artery at the vertebrobasilar junction. Normal appearance of the basilar. No luminal based T1 hyperintensity involving the narrow vessels to suggest acute dissection. IMPRESSION: 1. No acute finding, including infarct. The left parietal abnormality on CT yesterday reflects remote small vessel infarct. 2. Extensive intracranial atherosclerosis, as described above. The most advanced and proximal stenoses involve the right PCA, right MCA, and left supra clinoid ICA. Electronically Signed   By: Monte Fantasia M.D.   On: 12/24/2014 09:22   US Carotid Bilateral  12/23/2014  CLINICAL DATA:  CVA.  History of syncopal episode and hypertension. EXAM: BILATERAL CAROTID DUPLEX ULTRASOUND TECHNIQUE: Pearline Cables scale imaging, color Doppler and duplex ultrasound were performed of bilateral carotid and vertebral arteries in the neck. COMPARISON:  Head CT - 12/23/2014 FINDINGS: Criteria: Quantification of carotid stenosis is based on velocity parameters that correlate the residual internal carotid diameter with  NASCET-based stenosis levels, using the diameter of the distal internal carotid lumen as the denominator for stenosis measurement. The following velocity measurements were obtained: RIGHT ICA:  93/22 cm/sec CCA:  123XX123 cm/sec SYSTOLIC ICA/CCA RATIO:  1.1 DIASTOLIC ICA/CCA RATIO:  1.6 ECA:  60 cm/sec LEFT ICA:  181/28 cm/sec CCA:  0000000 cm/sec SYSTOLIC ICA/CCA RATIO:  1.7 DIASTOLIC ICA/CCA RATIO:  1.2 ECA:  177 cm/sec RIGHT CAROTID ARTERY: There is a minimal amount of circumferential intimal thickening throughout the right common carotid artery (representative images 8, 12 and 16). There is a minimal amount intimal wall thickening within the right carotid bulb (image 20), extending to involve the origin and proximal aspect the right internal carotid artery (image 27), not resulting in elevated peak systolic velocities within the interrogated course of the right internal carotid artery to suggest a hemodynamically significant stenosis. RIGHT VERTEBRAL ARTERY:  Antegrade flow LEFT CAROTID ARTERY: There is a moderate amount of  eccentric mixed echogenic plaque within the proximal and mid aspects of the left common carotid artery (representative images 39 and 43). There is a large amount of eccentric mixed echogenic partially shadowing plaque within the left carotid bulb (image 51), extending to involve the origin of the left internal carotid artery (image 59), resulting in elevated peak systolic velocities within the proximal aspect of the left internal carotid artery (greatest acquired peak systolic velocity within the proximal left ICA measures 181 cm/sec - image 61). LEFT VERTEBRAL ARTERY:  Antegrade flow Incidental note is made of an apparent transient cardiac arrhythmia (image 37). IMPRESSION: 1. Large amount of left-sided atherosclerotic plaque results in elevated peak systolic velocities within the left internal carotid artery compatible with the 50-69% luminal narrowing range. Further evaluation with CTA could  be performed as clinically indicated. 2. Minimal amount of right-sided intimal wall thickening, not resulting in a hemodynamically significant stenosis. 3. Apparent transient cardiac arrhythmia. Further evaluation with ECG monitoring could be performed as clinically indicated. Electronically Signed   By: Sandi Mariscal M.D.   On: 12/23/2014 16:25    EKG:   Orders placed or performed during the hospital encounter of 12/23/14  . ED EKG  . ED EKG  . EKG 12-Lead  . EKG 12-Lead      Management plans discussed with the patient, family and they are in agreement.  CODE STATUS:     Code Status Orders        Start     Ordered   12/23/14 1413  Full code   Continuous     12/23/14 1412      TOTAL TIME TAKING CARE OF THIS PATIENT: 45  minutes.    @[facility] @  on 12/26/2014 at 9:59 AM  Between 7am to 6pm - Pager - [phone]  After 6pm go to [email or web address] - password [email or web address]  Office  [phone]  CC: Primary care physician; Pcp Not In System

## 2014-12-27 MED ORDER — METOPROLOL TARTRATE 25 MG PO TABS: 12.5000 mg | ORAL_TABLET | Freq: Two times a day (BID) | ORAL | Status: DC

## 2014-12-27 MED ORDER — METOPROLOL TARTRATE 25 MG PO TABS
12.5000 mg | ORAL_TABLET | Freq: Once | ORAL | Status: AC
  Administered 2014-12-27: 11:00:00 12.5 mg via ORAL
  Filled 2014-12-27: qty 1

## 2014-12-27 MED ORDER — AMLODIPINE BESYLATE 5 MG PO TABS: 5.0000 mg | ORAL_TABLET | Freq: Every day | ORAL | Status: DC

## 2014-12-27 NOTE — Plan of Care (Signed)
Problem: Discharge/Transitional Outcomes Goal: Other Discharge Outcomes/Goals Outcome: Progressing Patient c/o right leg  Pain x1 relieved well with prn Percocet  Patient ambulated outside the room HR in the 90's but not over 100 bpm Daughter at beside VSS Patient is tolerating diet well  Received MD order discharge patient to home following with [facility] Discharge instructions, prescriptions and home meds reviewed with patient and daughter and both verbalized understanding, discharge with EMS in stretcher to home

## 2014-12-27 NOTE — Care Management Important Message (Signed)
Important Message  Patient Details  Name: Katelyn [name] MRN: [id number] Date of Birth: December 04, 1923   Medicare Important Message Given:  Yes-second notification given    [name], RN 12/27/2014, 12:03 PM

## 2014-12-27 NOTE — Discharge Summary (Signed)
[facility]: Katelyn [name]    MR#:  [id number]  DATE OF BIRTH:  September 26, 1923  DATE OF ADMISSION:  12/23/2014 ADMITTING PHYSICIAN: Aldean Jewett, MD  DATE OF DISCHARGE: 12/26/2014 PRIMARY CARE PHYSICIAN: Pcp Not In System    ADMISSION DIAGNOSIS:  CVA (cerebral infarction) [I63.9] Acute ischemic stroke ([place]) [I63.9]  DISCHARGE DIAGNOSIS:  Significant atherosclerosis Acute right knee pain Sinus tachycardia  SECONDARY DIAGNOSIS:   Past Medical History  Diagnosis Date  . Hypertension   . Chronic kidney disease   . Paget's disease of bone   . Gout     HOSPITAL COURSE:   #1 ruled out acute CVA: ruled out with neg MRI MRI with no acute CVA but revealed atherosclerosis telemetry.  2-D echocardiogram lvef 55-60% no trend of cycled cardiac enzymes.  physical therapy consultation- recomends snf. Patient prefers to go home and [facility] for [facility] of [facility] Neurology recommended asa and plavix for significant attherosclerosis  Lt ICA stenosis 50-69% - vascular recs OP f/u and CTA prn   #2 chronic ischemic right lower extremity:  cold to touch, unable to find pulse with Doppler.  vascular surgery recommended op f/u as its chronic, d/w dr.Dew, op angiogram  #3 acute Rt knee pain with effusion , elevated uric acid- prob acute gouty arthritis of rt knee/ tender from recent fall Significant improvement with Solumedrol and Indocin, tapered to by mouth prednisone. Will discontinue prednisone at the time of discharge and continue Indocin for 4 more days Outpatient follow-up with rheumatology is recommended XRAY knee and ankle with no fractures  start po prednisone - for 2- 3 days and stop  PT eval- recs SNF, patient wants to [facility] with [facility] as an outpatient  34 hypertension: Blood pressure stable . Will resume antihypertensives, amlodipine dose decreased to 5 mg  #4  chronic kidney disease stage III: Baseline creatinine 1.15, creatinine in the normal range  #5 hyperglycemia: hemoglobin A1c- 5.7  NO other investigation needed at this time  #6.sinus tachycardia - prob from pain RLE Lopressor 12.5 mg added to the regimen    # Disposition: PT recommends SNF, the patient and family wants to take her home and follow-up with [facility] as an outpatient from tomorrow  DISCHARGE CONDITIONS:   fair  CONSULTS OBTAINED:  Treatment Team:  Algernon Huxley, MD [name]., MD   PROCEDURES none  DRUG ALLERGIES:  No Known Allergies  DISCHARGE MEDICATIONS:   Current Discharge Medication List    START taking these medications   Details  aspirin 81 MG chewable tablet Chew 1 tablet (81 mg total) by mouth daily.    clopidogrel (PLAVIX) 75 MG tablet Take 1 tablet (75 mg total) by mouth daily. Qty: 30 tablet, Refills: 0    indomethacin (INDOCIN) 50 MG capsule Take 1 capsule (50 mg total) by mouth 2 (two) times daily with a meal. Qty: 10 capsule, Refills: 0    metoprolol tartrate (LOPRESSOR) 25 MG tablet Take 0.5 tablets (12.5 mg total) by mouth 2 (two) times daily. Qty: 30 tablet, Refills: 0    pravastatin (PRAVACHOL) 20 MG tablet Take 1 tablet (20 mg total) by mouth daily at 6 PM. Qty: 30 tablet, Refills: 0      CONTINUE these medications which have CHANGED   Details  amLODipine (NORVASC) 5 MG tablet Take 1 tablet (5 mg total) by mouth daily. Qty: 30 tablet, Refills: 0  CONTINUE these medications which have NOT CHANGED   Details  hydrocortisone (ANUSOL-HC) 2.5 % rectal cream Place 1 application rectally 2 (two) times daily as needed for hemorrhoids.    losartan (COZAAR) 50 MG tablet Take 50 mg by mouth daily.    polyethylene glycol (MIRALAX / GLYCOLAX) packet Take 17 g by mouth daily.    fluticasone (FLONASE) 50 MCG/ACT nasal spray Place 2 sprays into both nostrils daily. Qty: 16 g, Refills: 5    LORazepam (ATIVAN) 1 MG tablet Take  1 tablet (1 mg total) by mouth 2 (two) times daily. Qty: 14 tablet, Refills: 0    ondansetron (ZOFRAN) 4 MG tablet Take 1 tablet (4 mg total) by mouth every 8 (eight) hours as needed for nausea or vomiting. Qty: 20 tablet, Refills: 1         DISCHARGE INSTRUCTIONS:   Activity as tolerated, as recommended by physical therapy. Ambulate with walker  Diet healthy heart Follow-up with primary care physician at [facility]  Tomorrow Continue outpatient rehabilitation at [facility] Follow-up with Neurology Dr. Brigitte Pulse in a month Follow-up with orthopedics as needed basis Follow-up with rheumatology for gout in 1 week Follow-up with Dr. Marzetta Board in a week     DIET:  Cardiac diet  DISCHARGE CONDITION:  Fair  ACTIVITY:  Activity as tolerated as per PT recommendations, with walker  OXYGEN:  Home Oxygen: No.   Oxygen Delivery: room air  DISCHARGE LOCATION:  home , to continue [facility] from tomorrow  If you experience worsening of your admission symptoms, develop shortness of breath, life threatening emergency, suicidal or homicidal thoughts you must seek medical attention immediately by calling 911 or calling your MD immediately  if symptoms less severe.  You Must read complete instructions/literature along with all the possible adverse reactions/side effects for all the Medicines you take and that have been prescribed to you. Take any new Medicines after you have completely understood and accpet all the possible adverse reactions/side effects.   Please note  You were cared for by a hospitalist during your hospital stay. If you have any questions about your discharge medications or the care you received while you were in the hospital after you are discharged, you can call the unit and asked to speak with the hospitalist on call if the hospitalist that took care of you is not available. Once you are discharged, your primary care physician will handle any further medical issues.  Please note that NO REFILLS for any discharge medications will be authorized once you are discharged, as it is imperative that you return to your primary care physician (or establish a relationship with a primary care physician if you do not have one) for your aftercare needs so that they can reassess your need for medications and monitor your lab values.     Today  Chief Complaint  Patient presents with  . Fall  . Leg Pain   Patient is feeling fine today. No complaints. Ambulating with the help of walker, HR in 90's . Daughter at bedside  ROS:  CONSTITUTIONAL: Denies fevers, chills. Denies any fatigue, weakness.  EYES: Denies blurry vision, double vision, eye pain. EARS, NOSE, THROAT: Denies tinnitus, ear pain, hearing loss. RESPIRATORY: Denies cough, wheeze, shortness of breath.  CARDIOVASCULAR: Denies chest pain, palpitations, edema.  GASTROINTESTINAL: Denies nausea, vomiting, diarrhea, abdominal pain. Denies bright red blood per rectum. GENITOURINARY: Denies dysuria, hematuria. ENDOCRINE: Denies nocturia or thyroid problems. HEMATOLOGIC AND LYMPHATIC: Denies easy bruising or bleeding. SKIN: Denies rash  or lesion. MUSCULOSKELETAL: Denies pain in neck, back, shoulder, knees, hips or arthritic symptoms.  NEUROLOGIC: Denies paralysis, paresthesias.  PSYCHIATRIC: Denies anxiety or depressive symptoms.   VITAL SIGNS:  Blood pressure 146/63, pulse 66, temperature 97.9 F (36.6 C), temperature source Oral, resp. rate 18, height 5\' 5"  (1.651 m), weight 81.194 kg (179 lb), SpO2 100 %.  I/O:    Intake/Output Summary (Last 24 hours) at 12/27/14 1321 Last data filed at 12/27/14 0800  Gross per 24 hour  Intake    240 ml  Output      0 ml  Net    240 ml    PHYSICAL EXAMINATION:  GENERAL:  81 y.o.-year-old patient lying in the bed with no acute distress.  EYES: Pupils equal, round, reactive to light and accommodation. No scleral icterus. Extraocular muscles intact.  HEENT: Head  atraumatic, normocephalic. Oropharynx and nasopharynx clear.  NECK:  Supple, no jugular venous distention. No thyroid enlargement, no tenderness.  LUNGS: Normal breath sounds bilaterally, no wheezing, rales,rhonchi or crepitation. No use of accessory muscles of respiration.  CARDIOVASCULAR: S1, S2 normal. No murmurs, rubs, or gallops.  ABDOMEN: Soft, non-tender, non-distended. Bowel sounds present. No organomegaly or mass.  EXTREMITIES: No pedal edema, cyanosis, or clubbing. Right knee tenderness and effusion  significantly improved NEUROLOGIC: Cranial nerves II through XII are intact. Muscle strength at her baseline. Sensation intact. Gait not checked.  PSYCHIATRIC: The patient is alert and oriented x 3.  SKIN: No obvious rash, lesion, or ulcer.   DATA REVIEW:   CBC  Recent Labs Lab 12/24/14 0445  WBC 6.9  HGB 11.7*  HCT 34.6*  PLT 152    Chemistries   Recent Labs Lab 12/23/14 0949 12/24/14 0445  NA 140 140  K 3.6 3.8  CL 108 109  CO2 23 26  GLUCOSE 153* 118*  BUN 21* 18  CREATININE 1.18* 0.95  CALCIUM 8.9 8.9  AST 19  --   ALT 9*  --   ALKPHOS 56  --   BILITOT 0.6  --     Cardiac Enzymes  Recent Labs Lab 12/24/14 0141  TROPONINI 0.03    Microbiology Results  Results for orders placed or performed during the hospital encounter of 10/13/14  Urine culture     Status: None   Collection Time: 10/13/14  2:15 PM  Result Value Ref Range Status   Specimen Description URINE, CLEAN CATCH  Final   Special Requests NONE  Final   Culture INSIGNIFICANT GROWTH  Final   Report Status 10/14/2014 FINAL  Final    RADIOLOGY:  Dg Knee 1-2 Views Right  12/25/2014  CLINICAL DATA:  Right knee and right ankle pain with no known injury. EXAM: RIGHT KNEE - 1-2 VIEW COMPARISON:  None. FINDINGS: No fracture. There is narrowing of the medial patellofemoral joint space compartments with marginal osteophytes from all 3 compartments. Ossified/calcified densities are seen posteriorly.  There is no joint effusion. Bones are demineralized. Soft tissues are unremarkable. IMPRESSION: 1. No fracture or acute finding. 2. Osteoarthritis.  Probable posterior intra-articular bodies. Electronically Signed   By: Lajean Manes M.D.   On: 12/25/2014 14:16   Dg Ankle Complete Right  12/25/2014  CLINICAL DATA:  Right ankle pain, no known injury EXAM: RIGHT ANKLE - COMPLETE 3+ VIEW COMPARISON:  None. FINDINGS: Three views of the right ankle submitted. No acute fracture or subluxation. Ankle mortise is preserved. IMPRESSION: Negative. Electronically Signed   By: Lahoma Crocker M.D.   On: 12/25/2014 14:21  Mr Jodene Nam Head Wo Contrast  12/24/2014  CLINICAL DATA:  Stroke with right leg numbness below the knee. EXAM: MRI HEAD WITHOUT AND WITH CONTRAST MRA HEAD WITHOUT CONTRAST TECHNIQUE: Multiplanar, multiecho pulse sequences of the brain and surrounding structures were obtained without and with intravenous contrast. Angiographic images of the head were obtained using MRA technique without contrast. CONTRAST:  54mL MULTIHANCE GADOBENATE DIMEGLUMINE 529 MG/ML IV SOLN COMPARISON:  Head CT from yesterday FINDINGS: MRI HEAD FINDINGS Calvarium and upper cervical spine: No focal marrow signal abnormality. Orbits: No significant findings. Sinuses and Mastoids: Clear. Brain: No acute abnormality such as acute infarct, hemorrhage, hydrocephalus, or mass lesion. No evidence of large vessel occlusion. The left parietal low-density on previous head CT reflects cortically based gliosis compatible with remote small vessel infarct. Chronic small-vessel disease with mild for age ischemic gliosis throughout the bilateral cerebral hemispheres. Normal cerebral volume for age. No abnormal intracranial enhancement. MRA HEAD FINDINGS Fetal type right PCA with hypoplastic or aplastic right A1 segment. Symmetric vertebral arteries. Extensive intracranial atherosclerosis. Upper right cervical carotid and siphon is patent. Aplastic or  hypoplastic A1 segment on the right. There is diffuse moderate narrowing of the right M1 segment, approaching high-grade distally. Atherosclerotic irregularity of the median vessels throughout the right MCA branches. Left cervical carotid and carotid siphon are patent. There is stenosis at the supra clinoid ICA level which is high-grade, with mild poststenotic dilatation. 1 mm conical outpouching posteriorly from the left supraclinoid carotid artery is likely a infundibulum with parent vessel below the resolution of MRA. There is a focal mild to moderate stenosis of the distal left M1 segment. Scattered atherosclerotic type narrowing of left operculum branches, high-grade involving the proximal inferior division. Asymmetric poor flow throughout the right PCA with nonvisualized right P1 segment (at least partly from fetal type PCA persistence). There is also flow gap involving the proximal right superior cerebellar artery and despite small vessel size is convincing on source images. Focal moderate narrowing involving the left vertebral artery at the vertebrobasilar junction. Normal appearance of the basilar. No luminal based T1 hyperintensity involving the narrow vessels to suggest acute dissection. IMPRESSION: 1. No acute finding, including infarct. The left parietal abnormality on CT yesterday reflects remote small vessel infarct. 2. Extensive intracranial atherosclerosis, as described above. The most advanced and proximal stenoses involve the right PCA, right MCA, and left supra clinoid ICA. Electronically Signed   By: Monte Fantasia M.D.   On: 12/24/2014 09:22   Mr Jeri Cos [id number] Contrast  12/24/2014  CLINICAL DATA:  Stroke with right leg numbness below the knee. EXAM: MRI HEAD WITHOUT AND WITH CONTRAST MRA HEAD WITHOUT CONTRAST TECHNIQUE: Multiplanar, multiecho pulse sequences of the brain and surrounding structures were obtained without and with intravenous contrast. Angiographic images of the head were  obtained using MRA technique without contrast. CONTRAST:  66mL MULTIHANCE GADOBENATE DIMEGLUMINE 529 MG/ML IV SOLN COMPARISON:  Head CT from yesterday FINDINGS: MRI HEAD FINDINGS Calvarium and upper cervical spine: No focal marrow signal abnormality. Orbits: No significant findings. Sinuses and Mastoids: Clear. Brain: No acute abnormality such as acute infarct, hemorrhage, hydrocephalus, or mass lesion. No evidence of large vessel occlusion. The left parietal low-density on previous head CT reflects cortically based gliosis compatible with remote small vessel infarct. Chronic small-vessel disease with mild for age ischemic gliosis throughout the bilateral cerebral hemispheres. Normal cerebral volume for age. No abnormal intracranial enhancement. MRA HEAD FINDINGS Fetal type right PCA with hypoplastic or aplastic right A1 segment. Symmetric vertebral  arteries. Extensive intracranial atherosclerosis. Upper right cervical carotid and siphon is patent. Aplastic or hypoplastic A1 segment on the right. There is diffuse moderate narrowing of the right M1 segment, approaching high-grade distally. Atherosclerotic irregularity of the median vessels throughout the right MCA branches. Left cervical carotid and carotid siphon are patent. There is stenosis at the supra clinoid ICA level which is high-grade, with mild poststenotic dilatation. 1 mm conical outpouching posteriorly from the left supraclinoid carotid artery is likely a infundibulum with parent vessel below the resolution of MRA. There is a focal mild to moderate stenosis of the distal left M1 segment. Scattered atherosclerotic type narrowing of left operculum branches, high-grade involving the proximal inferior division. Asymmetric poor flow throughout the right PCA with nonvisualized right P1 segment (at least partly from fetal type PCA persistence). There is also flow gap involving the proximal right superior cerebellar artery and despite small vessel size is  convincing on source images. Focal moderate narrowing involving the left vertebral artery at the vertebrobasilar junction. Normal appearance of the basilar. No luminal based T1 hyperintensity involving the narrow vessels to suggest acute dissection. IMPRESSION: 1. No acute finding, including infarct. The left parietal abnormality on CT yesterday reflects remote small vessel infarct. 2. Extensive intracranial atherosclerosis, as described above. The most advanced and proximal stenoses involve the right PCA, right MCA, and left supra clinoid ICA. Electronically Signed   By: Monte Fantasia M.D.   On: 12/24/2014 09:22   US Carotid Bilateral  12/23/2014  CLINICAL DATA:  CVA.  History of syncopal episode and hypertension. EXAM: BILATERAL CAROTID DUPLEX ULTRASOUND TECHNIQUE: Pearline Cables scale imaging, color Doppler and duplex ultrasound were performed of bilateral carotid and vertebral arteries in the neck. COMPARISON:  Head CT - 12/23/2014 FINDINGS: Criteria: Quantification of carotid stenosis is based on velocity parameters that correlate the residual internal carotid diameter with NASCET-based stenosis levels, using the diameter of the distal internal carotid lumen as the denominator for stenosis measurement. The following velocity measurements were obtained: RIGHT ICA:  93/22 cm/sec CCA:  123XX123 cm/sec SYSTOLIC ICA/CCA RATIO:  1.1 DIASTOLIC ICA/CCA RATIO:  1.6 ECA:  60 cm/sec LEFT ICA:  181/28 cm/sec CCA:  0000000 cm/sec SYSTOLIC ICA/CCA RATIO:  1.7 DIASTOLIC ICA/CCA RATIO:  1.2 ECA:  177 cm/sec RIGHT CAROTID ARTERY: There is a minimal amount of circumferential intimal thickening throughout the right common carotid artery (representative images 8, 12 and 16). There is a minimal amount intimal wall thickening within the right carotid bulb (image 20), extending to involve the origin and proximal aspect the right internal carotid artery (image 27), not resulting in elevated peak systolic velocities within the interrogated  course of the right internal carotid artery to suggest a hemodynamically significant stenosis. RIGHT VERTEBRAL ARTERY:  Antegrade flow LEFT CAROTID ARTERY: There is a moderate amount of eccentric mixed echogenic plaque within the proximal and mid aspects of the left common carotid artery (representative images 39 and 43). There is a large amount of eccentric mixed echogenic partially shadowing plaque within the left carotid bulb (image 51), extending to involve the origin of the left internal carotid artery (image 59), resulting in elevated peak systolic velocities within the proximal aspect of the left internal carotid artery (greatest acquired peak systolic velocity within the proximal left ICA measures 181 cm/sec - image 61). LEFT VERTEBRAL ARTERY:  Antegrade flow Incidental note is made of an apparent transient cardiac arrhythmia (image 37). IMPRESSION: 1. Large amount of left-sided atherosclerotic plaque results in elevated peak systolic velocities within the  left internal carotid artery compatible with the 50-69% luminal narrowing range. Further evaluation with CTA could be performed as clinically indicated. 2. Minimal amount of right-sided intimal wall thickening, not resulting in a hemodynamically significant stenosis. 3. Apparent transient cardiac arrhythmia. Further evaluation with ECG monitoring could be performed as clinically indicated. Electronically Signed   By: Sandi Mariscal M.D.   On: 12/23/2014 16:25    EKG:   Orders placed or performed during the hospital encounter of 12/23/14  . ED EKG  . ED EKG  . EKG 12-Lead  . EKG 12-Lead      Management plans discussed with the patient, family and they are in agreement.  CODE STATUS:     Code Status Orders        Start     Ordered   12/23/14 1413  Full code   Continuous     12/23/14 1412      TOTAL TIME TAKING CARE OF THIS PATIENT: 45  minutes.    @[facility] @  on 12/27/2014 at 1:21 PM  Between 7am to 6pm - Pager -  [phone]  After 6pm go to [email or web address] - password [email or web address] [facility]  Office  [phone]  CC: Primary care physician; Pcp Not In System

## 2014-12-27 NOTE — Plan of Care (Signed)
Problem: Discharge/Transitional Outcomes Goal: Other Discharge Outcomes/Goals Outcome: Progressing Plan of care progress to goal: Barrier to progression-HR elevated today with activity, Metoprolol added to medications per report. Heart rate stable this shift, SB/SR. Pain-no c/o pain this shift.   Educational plan- continue to assess need for education, educate as needed.   Independent mobility- pt continues to need assistance while up OOB, ambulates with a rolling walker with standby assist to BR.   Diet: tolerating. No c/o nausea or vomiting this shift.   [facility]ible [facility] today with [facility].

## 2015-01-13 ENCOUNTER — Ambulatory Visit
Admission: RE | Admit: 2015-01-13 | Discharge: 2015-01-13 | Disposition: A | Discharge status: Home / Self Care | Payer: Medicare (Managed Care) | Source: Ambulatory Visit | Attending: Vascular Surgery | Admitting: Vascular Surgery

## 2015-01-13 ENCOUNTER — Encounter: Payer: Self-pay | Admitting: *Deleted

## 2015-01-13 ENCOUNTER — Encounter
Admission: RE | Disposition: A | Discharge status: Home / Self Care | Payer: Self-pay | Source: Ambulatory Visit | Attending: Vascular Surgery

## 2015-01-13 DIAGNOSIS — I70221 Atherosclerosis of native arteries of extremities with rest pain, right leg: Secondary | ICD-10-CM | POA: Insufficient documentation

## 2015-01-13 DIAGNOSIS — Z79899 Other long term (current) drug therapy: Secondary | ICD-10-CM | POA: Insufficient documentation

## 2015-01-13 DIAGNOSIS — Z7982 Long term (current) use of aspirin: Secondary | ICD-10-CM | POA: Insufficient documentation

## 2015-01-13 DIAGNOSIS — I10 Essential (primary) hypertension: Secondary | ICD-10-CM | POA: Insufficient documentation

## 2015-01-13 DIAGNOSIS — N183 Chronic kidney disease, stage 3 (moderate): Secondary | ICD-10-CM | POA: Diagnosis not present

## 2015-01-13 HISTORY — DX: Peripheral vascular disease, unspecified: I73.9

## 2015-01-13 HISTORY — DX: Pneumonia, unspecified organism: J18.9

## 2015-01-13 HISTORY — DX: Other specified cough: R05.8

## 2015-01-13 HISTORY — PX: PERIPHERAL VASCULAR CATHETERIZATION: SHX172C

## 2015-01-13 HISTORY — DX: Anxiety disorder, unspecified: F41.9

## 2015-01-13 HISTORY — DX: Cough: R05

## 2015-01-13 HISTORY — DX: Unspecified osteoarthritis, unspecified site: M19.90

## 2015-01-13 LAB — CREATININE, SERUM
Creatinine, Ser: 1.16 mg/dL — ABNORMAL HIGH (ref 0.44–1.00)
GFR calc non Af Amer: 40 mL/min — ABNORMAL LOW (ref >60)
GFR, EST AFRICAN AMERICAN: 47 mL/min — AB (ref >60)

## 2015-01-13 LAB — BUN: BUN: 24 mg/dL — ABNORMAL HIGH (ref 6–20)

## 2015-01-13 SURGERY — ABDOMINAL AORTOGRAM W/LOWER EXTREMITY

## 2015-01-13 MED ORDER — DEXTROSE 5 % IV SOLN
INTRAVENOUS | Status: AC
  Filled 2015-01-13: qty 1.5

## 2015-01-13 MED ORDER — MIDAZOLAM HCL 5 MG/5ML IJ SOLN
INTRAMUSCULAR | Status: AC
  Filled 2015-01-13: qty 5

## 2015-01-13 MED ORDER — APIXABAN 2.5 MG PO TABS
2.5000 mg | ORAL_TABLET | Freq: Two times a day (BID) | ORAL | Status: DC
  Administered 2015-01-13: 2.5 mg via ORAL
  Filled 2015-01-13 (×3): qty 1

## 2015-01-13 MED ORDER — DIPHENHYDRAMINE HCL 50 MG/ML IJ SOLN
INTRAMUSCULAR | Status: DC | PRN
  Administered 2015-01-13: 50 mg via INTRAVENOUS

## 2015-01-13 MED ORDER — LIDOCAINE-EPINEPHRINE (PF) 1 %-1:200000 IJ SOLN
INTRAMUSCULAR | Status: DC | PRN
  Administered 2015-01-13: 10 mL via INTRADERMAL

## 2015-01-13 MED ORDER — HYDRALAZINE HCL 20 MG/ML IJ SOLN: 5.0000 mg | INTRAMUSCULAR | Status: DC | PRN

## 2015-01-13 MED ORDER — ACETAMINOPHEN 325 MG PO TABS: 325.0000 mg | ORAL_TABLET | ORAL | Status: DC | PRN

## 2015-01-13 MED ORDER — FENTANYL CITRATE (PF) 100 MCG/2ML IJ SOLN
INTRAMUSCULAR | Status: AC
  Filled 2015-01-13: qty 2

## 2015-01-13 MED ORDER — METOPROLOL TARTRATE 1 MG/ML IV SOLN: 2.0000 mg | INTRAVENOUS | Status: DC | PRN

## 2015-01-13 MED ORDER — HYDROCOD POLST-CPM POLST ER 10-8 MG/5ML PO SUER
ORAL | Status: AC
  Filled 2015-01-13: qty 5

## 2015-01-13 MED ORDER — HEPARIN SODIUM (PORCINE) 1000 UNIT/ML IJ SOLN
INTRAMUSCULAR | Status: AC
  Filled 2015-01-13: qty 1

## 2015-01-13 MED ORDER — OXYCODONE-ACETAMINOPHEN 5-325 MG PO TABS: 1.0000 | ORAL_TABLET | ORAL | Status: DC | PRN

## 2015-01-13 MED ORDER — SODIUM CHLORIDE 0.9 % IJ SOLN
INTRAMUSCULAR | Status: AC
  Filled 2015-01-13: qty 15

## 2015-01-13 MED ORDER — LIDOCAINE-EPINEPHRINE (PF) 1 %-1:200000 IJ SOLN
INTRAMUSCULAR | Status: AC
  Filled 2015-01-13: qty 30

## 2015-01-13 MED ORDER — MIDAZOLAM HCL 2 MG/2ML IJ SOLN
INTRAMUSCULAR | Status: DC | PRN
  Administered 2015-01-13 (×2): 1 mg via INTRAVENOUS
  Administered 2015-01-13: 2 mg via INTRAVENOUS

## 2015-01-13 MED ORDER — DIPHENHYDRAMINE HCL 50 MG/ML IJ SOLN
INTRAMUSCULAR | Status: AC
  Filled 2015-01-13: qty 1

## 2015-01-13 MED ORDER — PHENOL 1.4 % MT LIQD: 1.0000 | OROMUCOSAL | Status: DC | PRN

## 2015-01-13 MED ORDER — SODIUM BICARBONATE BOLUS VIA INFUSION
INTRAVENOUS | Status: AC
  Administered 2015-01-13: 10:00:00 via INTRAVENOUS
  Filled 2015-01-13: qty 1

## 2015-01-13 MED ORDER — IOHEXOL 300 MG/ML  SOLN
INTRAMUSCULAR | Status: DC | PRN
  Administered 2015-01-13: 85 mL via INTRA_ARTERIAL

## 2015-01-13 MED ORDER — SODIUM BICARBONATE 8.4 % IV SOLN
INTRAVENOUS | Status: AC
  Administered 2015-01-13: 11:00:00 via INTRAVENOUS
  Filled 2015-01-13: qty 1000

## 2015-01-13 MED ORDER — DIPHENHYDRAMINE HCL 50 MG/ML IJ SOLN
50.0000 mg | Freq: Once | INTRAMUSCULAR | Status: AC
  Administered 2015-01-13: 50 mg via INTRAVENOUS

## 2015-01-13 MED ORDER — GUAIFENESIN-DM 100-10 MG/5ML PO SYRP: 15.0000 mL | ORAL_SOLUTION | ORAL | Status: DC | PRN

## 2015-01-13 MED ORDER — APIXABAN 2.5 MG PO TABS: 2.5000 mg | ORAL_TABLET | Freq: Two times a day (BID) | ORAL | Status: AC

## 2015-01-13 MED ORDER — FENTANYL CITRATE (PF) 100 MCG/2ML IJ SOLN
INTRAMUSCULAR | Status: DC | PRN
  Administered 2015-01-13 (×2): 50 ug via INTRAVENOUS
  Administered 2015-01-13: 100 ug via INTRAVENOUS

## 2015-01-13 MED ORDER — SODIUM CHLORIDE 0.9 % IV SOLN
INTRAVENOUS | Status: DC
  Administered 2015-01-13: 09:00:00 via INTRAVENOUS

## 2015-01-13 MED ORDER — HYDROCOD POLST-CPM POLST ER 10-8 MG/5ML PO SUER: 5.0000 mL | Freq: Once | ORAL | Status: DC

## 2015-01-13 MED ORDER — LABETALOL HCL 5 MG/ML IV SOLN: 10.0000 mg | INTRAVENOUS | Status: DC | PRN

## 2015-01-13 MED ORDER — ACETAMINOPHEN 325 MG RE SUPP: 325.0000 mg | RECTAL | Status: DC | PRN

## 2015-01-13 MED ORDER — CEFUROXIME SODIUM 1.5 G IJ SOLR
1.5000 g | Freq: Once | INTRAMUSCULAR | Status: AC
  Administered 2015-01-13: 1.5 g via INTRAVENOUS

## 2015-01-13 MED ORDER — ONDANSETRON HCL 4 MG/2ML IJ SOLN: 4.0000 mg | Freq: Four times a day (QID) | INTRAMUSCULAR | Status: DC | PRN

## 2015-01-13 MED ORDER — HEPARIN (PORCINE) IN NACL 2-0.9 UNIT/ML-% IJ SOLN
INTRAMUSCULAR | Status: AC
  Filled 2015-01-13: qty 1000

## 2015-01-13 MED ORDER — SODIUM CHLORIDE 0.9 % IV SOLN: 500.0000 mL | Freq: Once | INTRAVENOUS | Status: DC | PRN

## 2015-01-13 MED ORDER — HYDROMORPHONE HCL 1 MG/ML IJ SOLN: 0.5000 mg | INTRAMUSCULAR | Status: DC | PRN

## 2015-01-13 SURGICAL SUPPLY — 25 items
BALLN LUTONIX 5X150X130 (BALLOONS) ×4
BALLN LUTONIX DCB 4X100X130 (BALLOONS) ×4
BALLN ULTRVRSE 2.5X300X150 (BALLOONS) ×4
BALLN ULTRVRSE 3X300X150 (BALLOONS) ×2
BALLN ULTRVRSE 3X300X150 OTW (BALLOONS) ×2
BALLN ULTRVRSE 5X22X130 (BALLOONS) ×4
BALLOON LUTONIX 5X150X130 (BALLOONS) ×2 IMPLANT
BALLOON LUTONIX DCB 4X100X130 (BALLOONS) ×2 IMPLANT
BALLOON ULTRVRSE 2.5X300X150 (BALLOONS) ×2 IMPLANT
BALLOON ULTRVRSE 3X300X150 OTW (BALLOONS) ×2 IMPLANT
BALLOON ULTRVRSE 5X22X130 (BALLOONS) ×2 IMPLANT
CATH ROYAL FLUSH PIG 5F 70CM (CATHETERS) ×4 IMPLANT
CATH VERT 100CM (CATHETERS) ×4 IMPLANT
DEVICE PRESTO INFLATION (MISCELLANEOUS) ×4 IMPLANT
DEVICE SOLENT OMNI 120CM (CATHETERS) ×4 IMPLANT
DEVICE STARCLOSE SE CLOSURE (Vascular Products) ×4 IMPLANT
GLIDEWIRE ADV .035X260CM (WIRE) ×4 IMPLANT
PACK ANGIOGRAPHY (CUSTOM PROCEDURE TRAY) ×4 IMPLANT
SHEATH ANL2 6FRX45 HC (SHEATH) ×4 IMPLANT
SHEATH BRITE TIP 5FRX11 (SHEATH) ×4 IMPLANT
STENT VIABAHN 6X250X120 (Permanent Stent) ×4 IMPLANT
SYR MEDRAD MARK V 150ML (SYRINGE) ×4 IMPLANT
TUBING CONTRAST HIGH PRESS 72 (TUBING) ×4 IMPLANT
WIRE G V18X300CM (WIRE) ×4 IMPLANT
WIRE J 3MM .035X145CM (WIRE) ×4 IMPLANT

## 2015-01-13 NOTE — Op Note (Signed)
[facility] Percutaneous Study/Intervention Procedural Note   Date of Surgery: 01/13/2015  Surgeon(s):DEW,JASON   Assistants:none  Pre-operative Diagnosis: PAD with rest pain RLE  Post-operative diagnosis: Same with native disease as well as thrombosis/embolic disease to the right leg  Procedure(s) Performed: 1. Ultrasound guidance for vascular access left femoral artery 2. Catheter placement into right posterior tibial artery and peroneal artery from left femoral approach 3. Aortogram and selective right lower extremity angiogram 4. Percutaneous transluminal angioplasty of right posterior tibial artery with 2.5 and 3 mm diameter angioplasty balloon 5. Percutaneous transluminal angioplasty of right peroneal artery with 2.5 mm diameter angioplasty balloon  6.  Percutaneous transluminal angioplasty of distal popliteal artery and tibioperoneal trunk with 4 mm diameter Lutonix drug-coated angioplasty balloon  7.  Percutaneous transluminal angioplasty of right popliteal artery and superficial femoral artery with 5 mm diameter Lutonix drug-coated angioplasty balloon used twice  8.  Viabahn covered stent placement to the right superficial femoral artery and popliteal artery for high-grade residual stenosis and thrombosis after angioplasty  9.  Mechanical rheolytic thrombectomy to the right superficial femoral artery, popliteal artery, tibioperoneal trunk, and peroneal artery using the AngioJet Omni catheter. 10.. StarClose closure device left femoral artery  EBL: 25 cc  Indications: Patient is a 81 year old female with rest pain of the right leg. The patient is brought in for angiography for further evaluation and potential treatment. Risks and benefits are discussed and informed consent is obtained  Procedure: The patient was identified and appropriate procedural time out was  performed. The patient was then placed supine on the table and prepped and draped in the usual sterile fashion. Ultrasound was used to evaluate the left common femoral artery. It was patent . A digital ultrasound image was acquired. A Seldinger needle was used to access the left common femoral artery under direct ultrasound guidance and a permanent image was performed. A 0.035 J wire was advanced without resistance and a 5Fr sheath was placed. Pigtail catheter was placed into the aorta and an AP aortogram was performed. This demonstrated normal renal arteries and normal aorta and iliac segments without significant stenosis. I then crossed the aortic bifurcation and advanced to the right femoral head. I had to advance the catheter into the SFA due to the slow flow to see anything distally. Selective right lower extremity angiogram was then performed. This demonstrated an occlusion in the mid SFA that appeared to be chronic disease with an element of thrombus. Similarly, there was an occlusion in the popliteal artery that was concerning for thrombosis. There was occlusion of the tibioperoneal trunk and the peroneal and posterior tibial arteries proximally but then reconstituted distally. Extensive treatment would be necessary to salvage her leg The patient was systemically heparinized and a 6 [place] Ansell sheath was then placed over the [employer] wire. I then used a Kumpe catheter and the advantage wire to navigate through the SFA and popliteal occlusions and then through the tibioperoneal trunk and posterior tibial occlusion or selective posterior tibial imaging was performed. This demonstrated a high-grade near occlusive stenosis in the distal posterior tibial artery from native disease that was not seen initially due to slow flow. I then elected to start treatment. A 0.018 wire was then placed into the foot and a 2.5 mm diameter by 30 cm length angioplasty balloon was inflated from the distal  posterior tibial artery up through the tibioperoneal trunk. This was inflated to 12 atm for 1 minute. I treated the distal  popliteal artery and the tibioperoneal trunk with a 4 mm diameter by 10 cm length Lutonix drug-coated angioplasty balloon inflated to date atmospheres for 1 minute. I then treated the SFA and the remainder of the popliteal artery with a 5 mm diameter by 15 cm length Lutonix drug-coated angioplasty balloon inflated twice to 12 atm. Following this, still residual thrombus and occlusion throughout with minimal flow distally. I elected to cover the popliteal artery and superficial femoral artery thrombus and occlusion with a 6 mm diameter by 25 cm length Viabahn covered stent postdilated with a 5 mm balloon. This resulted in mild residual thrombus at the top and bottom of the stent but the tibioperoneal trunk peroneal artery and posterior tibial artery into need to have thrombus that was essentially occlusive. I used a Kumpe catheter to cannulate the peroneal artery and confirm intraluminal flow in the peroneal artery below the proximal occlusion. A 2.5 mm diameter by 30 cm length angioplasty balloon was then inflated in the peroneal artery up through the tibioperoneal trunk into the popliteal artery. I then ran the Melvern catheter for about 80 cc of effluent returned in the SFA, popliteal artery, tibioperoneal trunk, and peroneal artery to restore a flow channel. This did result in improvement of flow channel although distal spasm limited flow distally. There was still thrombus in the posterior tibial artery and I recannulated this with the Kumpe catheter and the 0.018 wire. This was a larger vessel, and I upsized to a 3 mm diameter by 30 cm length angioplasty balloon inflated from the ankle up through the tibioperoneal trunk into the popliteal artery. Slow flow was still seen, but no obvious flow-limiting thrombus or stenosis was seen within the SFA, popliteal artery, tibioperoneal trunk,  or proximal portions of the tibial vessels. At this point, I felt we had improved her perfusion is much as possible and we'll try to treat any remaining distal thrombus with anticoagulation. I elected to terminate the procedure. The sheath was removed and StarClose closure device was deployed in the left femoral artery with excellent hemostatic result. The patient was taken to the recovery room in stable condition having tolerated the procedure well.  Findings:  Aortogram: Normal renal arteries, normal aorta, and normal iliac arteries Right Lower Extremity: Right SFA occlusion, right popliteal occlusion, occlusion of the tibioperoneal trunk and proximal peroneal and posterior tibial arteries, high-grade stenosis in the distal posterior tibial artery. This was a combination of native disease and thrombosis/embolic disease   Disposition: Patient was taken to the recovery room in stable condition having tolerated the procedure well.  Complications: None  DEW,JASON 01/13/2015 11:23 AM

## 2015-01-13 NOTE — H&P (Signed)
  [facility] History & Physical Update  The patient was interviewed and re-examined.  The patient's previous History and Physical has been reviewed and is unchanged.  There is no change in the plan of care. We plan to proceed with the scheduled procedure.  Aracelie Addis, MD  01/13/2015, 9:32 AM

## 2015-01-18 ENCOUNTER — Encounter: Payer: Self-pay | Admitting: Vascular Surgery

## 2015-02-14 ENCOUNTER — Emergency Department
Admission: EM | Admit: 2015-02-14 | Discharge: 2015-02-14 | Disposition: A | Discharge status: Home / Self Care | Payer: Medicare (Managed Care) | Attending: Emergency Medicine | Admitting: Emergency Medicine

## 2015-02-14 ENCOUNTER — Encounter: Payer: Self-pay | Admitting: Emergency Medicine

## 2015-02-14 ENCOUNTER — Emergency Department: Payer: Medicare (Managed Care)

## 2015-02-14 DIAGNOSIS — Z79899 Other long term (current) drug therapy: Secondary | ICD-10-CM | POA: Diagnosis not present

## 2015-02-14 DIAGNOSIS — Z7902 Long term (current) use of antithrombotics/antiplatelets: Secondary | ICD-10-CM | POA: Insufficient documentation

## 2015-02-14 DIAGNOSIS — N183 Chronic kidney disease, stage 3 (moderate): Secondary | ICD-10-CM | POA: Insufficient documentation

## 2015-02-14 DIAGNOSIS — R51 Headache: Secondary | ICD-10-CM | POA: Insufficient documentation

## 2015-02-14 DIAGNOSIS — E860 Dehydration: Secondary | ICD-10-CM | POA: Diagnosis not present

## 2015-02-14 DIAGNOSIS — N390 Urinary tract infection, site not specified: Secondary | ICD-10-CM

## 2015-02-14 DIAGNOSIS — I129 Hypertensive chronic kidney disease with stage 1 through stage 4 chronic kidney disease, or unspecified chronic kidney disease: Secondary | ICD-10-CM | POA: Diagnosis not present

## 2015-02-14 DIAGNOSIS — Z7982 Long term (current) use of aspirin: Secondary | ICD-10-CM | POA: Insufficient documentation

## 2015-02-14 DIAGNOSIS — R55 Syncope and collapse: Secondary | ICD-10-CM

## 2015-02-14 HISTORY — DX: Unspecified abdominal pain: R10.9

## 2015-02-14 HISTORY — DX: Unspecified abnormalities of gait and mobility: R26.9

## 2015-02-14 HISTORY — DX: Unspecified hemorrhoids: K64.9

## 2015-02-14 HISTORY — DX: Unspecified urinary incontinence: R32

## 2015-02-14 HISTORY — DX: Polyp of colon: K63.5

## 2015-02-14 HISTORY — DX: Unspecified atherosclerosis: I70.90

## 2015-02-14 HISTORY — DX: Unspecified hearing loss, unspecified ear: H91.90

## 2015-02-14 HISTORY — DX: Constipation, unspecified: K59.00

## 2015-02-14 HISTORY — DX: Proteinuria, unspecified: R80.9

## 2015-02-14 HISTORY — DX: Edema, unspecified: R60.9

## 2015-02-14 LAB — URINALYSIS COMPLETE WITH MICROSCOPIC (ARMC ONLY)
BILIRUBIN URINE: NEGATIVE
GLUCOSE, UA: NEGATIVE mg/dL
Hgb urine dipstick: NEGATIVE
KETONES UR: NEGATIVE mg/dL
NITRITE: NEGATIVE
Protein, ur: 30 mg/dL — AB
Specific Gravity, Urine: 1.03 (ref 1.005–1.030)
Trans Epithel, UA: 2
pH: 5 (ref 5.0–8.0)

## 2015-02-14 LAB — BASIC METABOLIC PANEL
Anion gap: 7 (ref 5–15)
BUN: 27 mg/dL — AB (ref 6–20)
CO2: 22 mmol/L (ref 22–32)
CREATININE: 1.29 mg/dL — AB (ref 0.44–1.00)
Calcium: 9.3 mg/dL (ref 8.9–10.3)
Chloride: 110 mmol/L (ref 101–111)
GFR, EST AFRICAN AMERICAN: 41 mL/min — AB (ref >60)
GFR, EST NON AFRICAN AMERICAN: 36 mL/min — AB (ref >60)
Glucose, Bld: 169 mg/dL — ABNORMAL HIGH (ref 65–99)
POTASSIUM: 4.5 mmol/L (ref 3.5–5.1)
Sodium: 139 mmol/L (ref 135–145)

## 2015-02-14 LAB — CBC
HCT: 32.5 % — ABNORMAL LOW (ref 35.0–47.0)
Hemoglobin: 10.7 g/dL — ABNORMAL LOW (ref 12.0–16.0)
MCH: 31.9 pg (ref 26.0–34.0)
MCHC: 33 g/dL (ref 32.0–36.0)
MCV: 96.5 fL (ref 80.0–100.0)
PLATELETS: 241 10*3/uL (ref 150–440)
RBC: 3.37 MIL/uL — ABNORMAL LOW (ref 3.80–5.20)
RDW: 14.7 % — AB (ref 11.5–14.5)
WBC: 6.9 10*3/uL (ref 3.6–11.0)

## 2015-02-14 LAB — TROPONIN I: Troponin I: <0.03 ng/mL (ref <0.031)

## 2015-02-14 MED ORDER — SODIUM CHLORIDE 0.9 % IV SOLN
1000.0000 mL | Freq: Once | INTRAVENOUS | Status: AC
  Administered 2015-02-14: 1000 mL via INTRAVENOUS

## 2015-02-14 MED ORDER — NITROFURANTOIN MONOHYD MACRO 100 MG PO CAPS
100.0000 mg | ORAL_CAPSULE | Freq: Once | ORAL | Status: AC
  Administered 2015-02-14: 100 mg via ORAL
  Filled 2015-02-14: qty 1

## 2015-02-14 MED ORDER — NITROFURANTOIN MONOHYD MACRO 100 MG PO CAPS: 100.0000 mg | ORAL_CAPSULE | Freq: Two times a day (BID) | ORAL | Status: AC

## 2015-02-14 NOTE — ED Provider Notes (Signed)
[facility] Emergency Department Provider Note  ____________________________________________  Time seen: On arrival, via EMS  I have reviewed the triage vital signs and the nursing notes.   HISTORY  Chief Complaint Loss of Consciousness    HPI Katelyn [name] is a 81 y.o. female who was reportedly eating lunch and had a syncopal episode. She was caught by staff and did not sustain injury. She reports she feels well now. Reportedly she did have a mild headache prior to episode. No headache now. No neuro deficits. No chest pain no palpitations. No dizziness. She has no complaints     Past Medical History  Diagnosis Date  . Hypertension   . Paget's disease of bone   . Gout   . Cough with expectoration   . Peripheral vascular disease ([place])   . Pneumonia   . Osteoarthritis   . Anxiety   . Hearing loss   . Osteoarthritis   . Dependent edema bilateral legs  . Atherosclerosis   . Abdominal pain   . Hemorrhoids   . Constipation   . Proteinuria   . Incontinence   . Hearing loss   . Osteoarthritis   . Chronic kidney disease     stage 3  . Colon polyps   . Gait abnormality     Patient Active Problem List   Diagnosis Date Noted  . CVA (cerebral infarction) 12/23/2014  . CKD (chronic kidney disease) stage 3, GFR 30-59 ml/min 10/20/2014  . Hyperglycemia 10/20/2014  . Anxiety, mild 07/05/2014  . Essential (primary) hypertension 07/05/2014  . Acquired hallux valgus 07/05/2014  . Gastritis, Helicobacter pylori [id number]  . History of colon polyps 07/05/2014  . Arthritis of knee, degenerative 07/05/2014    Past Surgical History  Procedure Laterality Date  . Partial hysterectomy    . Peripheral vascular catheterization N/A 01/13/2015    Procedure: Abdominal Aortogram w/Lower Extremity;  Surgeon: Algernon Huxley, MD;  Location: [facility];  Service: Cardiovascular;  Laterality: N/A;  . Peripheral vascular catheterization  01/13/2015   Procedure: Lower Extremity Intervention;  Surgeon: Algernon Huxley, MD;  Location: [facility];  Service: Cardiovascular;;    Current Outpatient Rx  Name  Route  Sig  Dispense  Refill  . amLODipine (NORVASC) 5 MG tablet   Oral   Take 1 tablet (5 mg total) by mouth daily.   30 tablet   0   . apixaban (ELIQUIS) 2.5 MG TABS tablet   Oral   Take 1 tablet (2.5 mg total) by mouth 2 (two) times daily.   60 tablet   3   . aspirin 81 MG chewable tablet   Oral   Chew 1 tablet (81 mg total) by mouth daily.         . fluticasone (FLONASE) 50 MCG/ACT nasal spray   Each Nare   Place 2 sprays into both nostrils daily.   16 g   5   . hydrocortisone (ANUSOL-HC) 2.5 % rectal cream   Rectal   Place 1 application rectally 2 (two) times daily as needed for hemorrhoids.         Marland Kitchen LORazepam (ATIVAN) 1 MG tablet   Oral   Take 1 tablet (1 mg total) by mouth 2 (two) times daily.   14 tablet   0   . losartan (COZAAR) 50 MG tablet   Oral   Take 50 mg by mouth daily.         . metoprolol tartrate (LOPRESSOR)  25 MG tablet   Oral   Take 0.5 tablets (12.5 mg total) by mouth 2 (two) times daily.   30 tablet   0   . ondansetron (ZOFRAN) 4 MG tablet   Oral   Take 1 tablet (4 mg total) by mouth every 8 (eight) hours as needed for nausea or vomiting.   20 tablet   1   . polyethylene glycol (MIRALAX / GLYCOLAX) packet   Oral   Take 17 g by mouth daily.         . pravastatin (PRAVACHOL) 20 MG tablet   Oral   Take 1 tablet (20 mg total) by mouth daily at 6 PM.   30 tablet   0     Allergies Bee venom  Family History  Problem Relation Age of Onset  . Diabetes Mother   . Cancer Sister   . Cancer Sister   . Cancer Sister     Social History Social History  Substance Use Topics  . Smoking status: Never Smoker   . Smokeless tobacco: None  . Alcohol Use: No    Review of Systems  Constitutional: Negative for fever. Eyes: Negative for visual changes. ENT: Negative  for sore throat Cardiovascular: Negative for chest pain. Respiratory: Negative for shortness of breath. Gastrointestinal: Negative for abdominal pain, vomiting and diarrhea. Genitourinary: Negative for dysuria. Musculoskeletal: Negative for back pain. Skin: Negative for rash. Neurological: Positive for headache Psychiatric: No anxiety    ____________________________________________   PHYSICAL EXAM:  VITAL SIGNS: ED Triage Vitals  Enc Vitals Group     BP 02/14/15 1334 118/68 mmHg     Pulse Rate 02/14/15 1334 65     Resp 02/14/15 1334 19     Temp 02/14/15 1334 97.5 F (36.4 C)     Temp Source 02/14/15 1334 Oral     SpO2 02/14/15 1334 99 %     Weight 02/14/15 1334 170 lb 14.4 oz (77.52 kg)     Height 02/14/15 1334 5\' 5"  (1.651 m)     Head Cir --      Peak Flow --      Pain Score --      Pain Loc --      Pain Edu? --      Excl. in [place]? --      Constitutional: Alert and oriented. Well appearing and in no distress. Eyes: Conjunctivae are normal. PERRLA, EOMI ENT   Head: Normocephalic and atraumatic.   Mouth/Throat: Mucous membranes are moist. Cardiovascular: Normal rate, regular rhythm. Normal and symmetric distal pulses are present in all extremities. No murmurs, rubs, or gallops. Respiratory: Normal respiratory effort without tachypnea nor retractions. Breath sounds are clear and equal bilaterally.  Gastrointestinal: Soft and non-tender in all quadrants. No distention. There is no CVA tenderness. Genitourinary: deferred Musculoskeletal: Nontender with normal range of motion in all extremities. No lower extremity tenderness nor edema. Neurologic:  Normal speech and language. No gross focal neurologic deficits are appreciated. Cranial nerves II-12 are normal Skin:  Skin is warm, dry and intact. No rash noted. Psychiatric: Mood and affect are normal. Patient exhibits appropriate insight and judgment.  ____________________________________________    LABS (pertinent  positives/negatives)  Labs Reviewed  BASIC METABOLIC PANEL - Abnormal; Notable for the following:    Glucose, Bld 169 (*)    BUN 27 (*)    Creatinine, Ser 1.29 (*)    GFR calc non Af Amer 36 (*)    GFR calc Af Amer 41 (*)  All other components within normal limits  CBC - Abnormal; Notable for the following:    RBC 3.37 (*)    Hemoglobin 10.7 (*)    HCT 32.5 (*)    RDW 14.7 (*)    All other components within normal limits  URINALYSIS COMPLETEWITH MICROSCOPIC ([facility] ONLY) - Abnormal; Notable for the following:    Color, Urine YELLOW (*)    APPearance HAZY (*)    Protein, ur 30 (*)    Leukocytes, UA 2+ (*)    Bacteria, UA RARE (*)    Squamous Epithelial / LPF 0-5 (*)    All other components within normal limits  TROPONIN I    ____________________________________________   EKG  ED ECG REPORT I, Lavonia Drafts, the attending physician, personally viewed and interpreted this ECG.   Date: 02/14/2015  EKG Time: 1:37 PM  Rate: 62  Rhythm: frequent PVC's noted  Axis: Normal  Intervals:none  ST&T Change: Nonspecific  No significant changes from prior EKG  ____________________________________________    RADIOLOGY I have personally reviewed any xrays that were ordered on this patient: CT head unremarkable  ____________________________________________   PROCEDURES  Procedure(s) performed: none  Critical Care performed: none  ____________________________________________   INITIAL IMPRESSION / ASSESSMENT AND PLAN / ED COURSE  Pertinent labs & imaging results that were available during my care of the patient were reviewed by me and considered in my medical decision making (see chart for details).  Patient with benign exam. Lab results are consistent with mild dehydration and urinary tract infection. She does have PVCs noted on her EKG however during ED monitoring PVCs have been rare and she has been asymptomatic. We will check orthostatics Orthostatics We will  hydrate the patient and the prescription of antibiotics and have her follow-up with her PCP.  I've asked Dr. Edd Fabian to follow-up on the patient's orthostatics  ____________________________________________   FINAL CLINICAL IMPRESSION(S) / ED DIAGNOSES  Final diagnoses:  Syncope and collapse  Dehydration  UTI (lower urinary tract infection)     Lavonia Drafts, MD 02/14/15 [phone]

## 2015-02-14 NOTE — ED Provider Notes (Signed)
-----------------------------------------   6:25 PM on 02/14/2015 -----------------------------------------  Care was assumed from Dr. Corky Downs at approximately 4 PM pending orthostatic check. She has no evidence of orthostatic hypotension, does develop mild tachycardia with standing but is asymptomatic. She reports she feels well and wants to go home. Resting vital signs are stable. We discussed return precautions, need for close PCP follow-up and she and daughter at bedside are comfortable with the discharge plan.  Joanne Gavel, MD 02/14/15 [phone]

## 2015-02-14 NOTE — Discharge Instructions (Signed)
Dehydration Dehydration means your body does not have as much fluid as it needs. Your kidneys, brain, and heart will not work properly without the right amount of fluids and salt. Older adults are more likely to become dehydrated than younger adults. This is because:   Their bodies do not hold water as well.  Their bodies do not respond to temperature changes as well.  They do not get thirsty as easily or as quickly. HOME CARE  Ask your doctor how to replace body fluid losses (rehydrate).  Drink enough fluids to keep your pee (urine) clear or pale yellow.  Drink small amounts of fluids often if you feel sick to your stomach (nauseous) or throw up (vomit).  Eat like you normally do.  Avoid:  Foods or drinks high in sugar.  Bubbly (carbonated) drinks.  Juice.  Very hot or cold fluids.  Drinks with caffeine.  Fatty, greasy foods.  Alcohol.  Tobacco.  Eating too much.  Gelatin desserts.  Wash your hands to avoid spreading germs (bacteria, viruses).  Only take medicine as told by your doctor.  Keep all doctor visits as told. GET HELP IF:  You have belly (abdominal) pain that gets worse or stays in one spot (localizes).  You have a rash, stiff neck, or bad headache.  You get easily annoyed, sleepy, or are hard to wake up.  You feel weak, dizzy, or very thirsty.  You have a fever. GET HELP RIGHT AWAY IF:   You cannot drink fluid without throwing up.  You get worse even with treatment.  You throw up often.  You have watery poop (diarrhea) often.  Your vomit has blood in it or looks greenish.  Your poop (stool) has blood in it or looks black and tarry.  You have not peed in 6 to 8 hours or have only peed a small amount of very dark pee.  You pass out (faint). MAKE SURE YOU:   Understand these instructions.  Will watch your condition.  Will get help right away if you are not doing well or get worse.   This information is not intended to replace  advice given to you by your health care provider. Make sure you discuss any questions you have with your health care provider.   Document Released: 02/15/2011 Document Revised: 03/03/2013 Document Reviewed: 11/03/2012 Elsevier Interactive Patient Education 2016 Elsevier Inc.  Urinary Tract Infection Urinary tract infections (UTIs) can develop anywhere along your urinary tract. Your urinary tract is your body's drainage system for removing wastes and extra water. Your urinary tract includes two kidneys, two ureters, a bladder, and a urethra. Your kidneys are a pair of bean-shaped organs. Each kidney is about the size of your fist. They are located below your ribs, one on each side of your spine. CAUSES Infections are caused by microbes, which are microscopic organisms, including fungi, viruses, and bacteria. These organisms are so small that they can only be seen through a microscope. Bacteria are the microbes that most commonly cause UTIs. SYMPTOMS  Symptoms of UTIs may vary by age and gender of the patient and by the location of the infection. Symptoms in young women typically include a frequent and intense urge to urinate and a painful, burning feeling in the bladder or urethra during urination. Older women and men are more likely to be tired, shaky, and weak and have muscle aches and abdominal pain. A fever may mean the infection is in your kidneys. Other symptoms of a kidney infection include  pain in your back or sides below the ribs, nausea, and vomiting. DIAGNOSIS To diagnose a UTI, your caregiver will ask you about your symptoms. Your caregiver will also ask you to provide a urine sample. The urine sample will be tested for bacteria and white blood cells. White blood cells are made by your body to help fight infection. TREATMENT  Typically, UTIs can be treated with medication. Because most UTIs are caused by a bacterial infection, they usually can be treated with the use of antibiotics. The  choice of antibiotic and length of treatment depend on your symptoms and the type of bacteria causing your infection. HOME CARE INSTRUCTIONS  If you were prescribed antibiotics, take them exactly as your caregiver instructs you. Finish the medication even if you feel better after you have only taken some of the medication.  Drink enough water and fluids to keep your urine clear or pale yellow.  Avoid caffeine, tea, and carbonated beverages. They tend to irritate your bladder.  Empty your bladder often. Avoid holding urine for long periods of time.  Empty your bladder before and after sexual intercourse.  After a bowel movement, women should cleanse from front to back. Use each tissue only once. SEEK MEDICAL CARE IF:   You have back pain.  You develop a fever.  Your symptoms do not begin to resolve within 3 days. SEEK IMMEDIATE MEDICAL CARE IF:   You have severe back pain or lower abdominal pain.  You develop chills.  You have nausea or vomiting.  You have continued burning or discomfort with urination. MAKE SURE YOU:   Understand these instructions.  Will watch your condition.  Will get help right away if you are not doing well or get worse.   This information is not intended to replace advice given to you by your health care provider. Make sure you discuss any questions you have with your health care provider.   Document Released: 12/06/2004 Document Revised: 11/17/2014 Document Reviewed: 04/06/2011 Elsevier Interactive Patient Education [employer].

## 2015-02-14 NOTE — ED Notes (Addendum)
Pt from [facility].  Was eating lunch and had syncopal episode.  Staff caught pt so no fall involved.  HA prior to syncope. Pt does not remember event

## 2015-03-04 ENCOUNTER — Ambulatory Visit: Payer: Self-pay | Admitting: Internal Medicine

## 2016-05-06 ENCOUNTER — Emergency Department
Admission: EM | Admit: 2016-05-06 | Discharge: 2016-05-06 | Disposition: A | Discharge status: Home / Self Care | Payer: Medicare Other | Attending: Emergency Medicine | Admitting: Emergency Medicine

## 2016-05-06 ENCOUNTER — Emergency Department: Payer: Medicare Other

## 2016-05-06 ENCOUNTER — Encounter: Payer: Self-pay | Admitting: Emergency Medicine

## 2016-05-06 DIAGNOSIS — Z7982 Long term (current) use of aspirin: Secondary | ICD-10-CM | POA: Diagnosis not present

## 2016-05-06 DIAGNOSIS — R109 Unspecified abdominal pain: Secondary | ICD-10-CM | POA: Diagnosis present

## 2016-05-06 DIAGNOSIS — K5900 Constipation, unspecified: Secondary | ICD-10-CM

## 2016-05-06 DIAGNOSIS — I129 Hypertensive chronic kidney disease with stage 1 through stage 4 chronic kidney disease, or unspecified chronic kidney disease: Secondary | ICD-10-CM | POA: Insufficient documentation

## 2016-05-06 DIAGNOSIS — Z79899 Other long term (current) drug therapy: Secondary | ICD-10-CM | POA: Diagnosis not present

## 2016-05-06 DIAGNOSIS — N183 Chronic kidney disease, stage 3 (moderate): Secondary | ICD-10-CM | POA: Insufficient documentation

## 2016-05-06 LAB — URINALYSIS, COMPLETE (UACMP) WITH MICROSCOPIC
Bacteria, UA: NONE SEEN
Bilirubin Urine: NEGATIVE
Glucose, UA: NEGATIVE mg/dL
Hgb urine dipstick: NEGATIVE
Ketones, ur: NEGATIVE mg/dL
Leukocytes, UA: NEGATIVE
Nitrite: NEGATIVE
Protein, ur: NEGATIVE mg/dL
RBC / HPF: NONE SEEN RBC/hpf (ref 0–5)
Specific Gravity, Urine: 1.016 (ref 1.005–1.030)
pH: 5 (ref 5.0–8.0)

## 2016-05-06 LAB — BASIC METABOLIC PANEL
Anion gap: 6 (ref 5–15)
BUN: 31 mg/dL — ABNORMAL HIGH (ref 6–20)
CALCIUM: 8.8 mg/dL — AB (ref 8.9–10.3)
CO2: 25 mmol/L (ref 22–32)
CREATININE: 1.42 mg/dL — AB (ref 0.44–1.00)
Chloride: 109 mmol/L (ref 101–111)
GFR calc non Af Amer: 31 mL/min — ABNORMAL LOW (ref >60)
GFR, EST AFRICAN AMERICAN: 36 mL/min — AB (ref >60)
GLUCOSE: 153 mg/dL — AB (ref 65–99)
Potassium: 4.2 mmol/L (ref 3.5–5.1)
Sodium: 140 mmol/L (ref 135–145)

## 2016-05-06 LAB — CBC
HCT: 35.2 % (ref 35.0–47.0)
Hemoglobin: 12.1 g/dL (ref 12.0–16.0)
MCH: 32.9 pg (ref 26.0–34.0)
MCHC: 34.3 g/dL (ref 32.0–36.0)
MCV: 95.9 fL (ref 80.0–100.0)
Platelets: 187 10*3/uL (ref 150–440)
RBC: 3.67 MIL/uL — ABNORMAL LOW (ref 3.80–5.20)
RDW: 14 % (ref 11.5–14.5)
WBC: 9.2 10*3/uL (ref 3.6–11.0)

## 2016-05-06 LAB — TROPONIN I: Troponin I: <0.03 ng/mL (ref <0.03)

## 2016-05-06 MED ORDER — MORPHINE SULFATE (PF) 2 MG/ML IV SOLN
INTRAVENOUS | Status: AC
  Filled 2016-05-06: qty 1

## 2016-05-06 MED ORDER — ONDANSETRON HCL 4 MG/2ML IJ SOLN
4.0000 mg | Freq: Once | INTRAMUSCULAR | Status: AC
  Administered 2016-05-06: 4 mg via INTRAVENOUS

## 2016-05-06 MED ORDER — MORPHINE SULFATE (PF) 2 MG/ML IV SOLN
2.0000 mg | Freq: Once | INTRAVENOUS | Status: AC
  Administered 2016-05-06: 2 mg via INTRAVENOUS

## 2016-05-06 MED ORDER — ONDANSETRON HCL 4 MG/2ML IJ SOLN
INTRAMUSCULAR | Status: AC
  Filled 2016-05-06: qty 2

## 2016-05-06 MED ORDER — SODIUM CHLORIDE 0.9 % IV BOLUS (SEPSIS)
500.0000 mL | Freq: Once | INTRAVENOUS | Status: AC
  Administered 2016-05-06: 500 mL via INTRAVENOUS

## 2016-05-06 MED ORDER — ONDANSETRON 4 MG PO TBDP
ORAL_TABLET | ORAL | Status: AC
  Filled 2016-05-06: qty 1

## 2016-05-06 NOTE — ED Provider Notes (Addendum)
[facility] Emergency Department Provider Note ____________________________________________   I have reviewed the triage vital signs and the triage nursing note.  HISTORY  Chief Complaint Fall and Weakness   Historian Patient, daughter and granddaughter who live with her  HPI Katelyn [name] is a 83 y.o. female with a history of constipation, lives with her daughter and granddaughter, has not had a bowel movement reportedly since about 1 week ago, here for abdominal pain and feeling like she has to have a bowel movement. She recently took [employer] oral, but she has not had a bowel movement so far. No fever or vomiting. She feels overall weakness. She is complaining of nausea and feeling "sick." No fever or coughing or trouble breathing or chest pain. No blood at the rectum, but she does have a history of hemorrhoids.  Pain and nausea and constipation are considered severe.    Past Medical History:  Diagnosis Date  . Abdominal pain   . Anxiety   . Atherosclerosis   . Chronic kidney disease    stage 3  . Colon polyps   . Constipation   . Cough with expectoration   . Dependent edema bilateral legs  . Gait abnormality   . Gout   . Hearing loss   . Hearing loss   . Hemorrhoids   . Hypertension   . Incontinence   . Osteoarthritis   . Osteoarthritis   . Osteoarthritis   . Paget's disease of bone   . Peripheral vascular disease ([place])   . Pneumonia   . Proteinuria     Patient Active Problem List   Diagnosis Date Noted  . CVA (cerebral infarction) 12/23/2014  . CKD (chronic kidney disease) stage 3, GFR 30-59 ml/min 10/20/2014  . Hyperglycemia 10/20/2014  . Anxiety, mild 07/05/2014  . Essential (primary) hypertension 07/05/2014  . Acquired hallux valgus 07/05/2014  . Gastritis, Helicobacter pylori 16/12/9602  . History of colon polyps 07/05/2014  . Arthritis of knee, degenerative 07/05/2014    Past Surgical History:  Procedure Laterality  Date  . PARTIAL HYSTERECTOMY    . PERIPHERAL VASCULAR CATHETERIZATION N/A 01/13/2015   Procedure: Abdominal Aortogram w/Lower Extremity;  Surgeon: Algernon Huxley, MD;  Location: [place] [facility];  Service: Cardiovascular;  Laterality: N/A;  . PERIPHERAL VASCULAR CATHETERIZATION  01/13/2015   Procedure: Lower Extremity Intervention;  Surgeon: Algernon Huxley, MD;  Location: [place] [facility];  Service: Cardiovascular;;    Prior to Admission medications   Medication Sig Start Date End Date Taking? Authorizing Provider  amLODipine (NORVASC) 5 MG tablet Take 1 tablet (5 mg total) by mouth daily. 12/27/14   Nicholes Mango, MD  apixaban (ELIQUIS) 2.5 MG TABS tablet Take 1 tablet (2.5 mg total) by mouth 2 (two) times daily. 01/13/15   Algernon Huxley, MD  aspirin 81 MG chewable tablet Chew 1 tablet (81 mg total) by mouth daily. 12/26/14   Nicholes Mango, MD  fluticasone (FLONASE) 50 MCG/ACT nasal spray Place 2 sprays into both nostrils daily. 09/03/14   Glean Hess, MD  hydrocortisone (ANUSOL-HC) 2.5 % rectal cream Place 1 application rectally 2 (two) times daily as needed for hemorrhoids.    Historical Provider, MD  losartan (COZAAR) 50 MG tablet Take 50 mg by mouth daily.    Historical Provider, MD  metoprolol tartrate (LOPRESSOR) 25 MG tablet Take 0.5 tablets (12.5 mg total) by mouth 2 (two) times daily. 12/27/14   Nicholes Mango, MD  polyethylene glycol (MIRALAX / GLYCOLAX) packet  Take 17 g by mouth daily.    Historical Provider, MD  pravastatin (PRAVACHOL) 20 MG tablet Take 1 tablet (20 mg total) by mouth daily at 6 PM. 12/26/14   Nicholes Mango, MD    Allergies  Allergen Reactions  . Bee Venom Anaphylaxis    Family History  Problem Relation Age of Onset  . Diabetes Mother   . Cancer Sister   . Cancer Sister   . Cancer Sister     Social History Social History  Substance Use Topics  . Smoking status: Never Smoker  . Smokeless tobacco: Never Used  . Alcohol use No    Review of  Systems  Constitutional: Negative for fever. Eyes: Negative for visual changes. ENT: Negative for sore throat. Cardiovascular: Negative for chest pain. Respiratory: Negative for shortness of breath. Gastrointestinal: Negative for Vomiting. Genitourinary: Negative for dysuria. Musculoskeletal: Negative for back pain. Skin: Negative for rash. Neurological: Negative for headache. 10 point Review of Systems otherwise negative ____________________________________________   PHYSICAL EXAM:  VITAL SIGNS: ED Triage Vitals  Enc Vitals Group     BP 05/06/16 1041 (!) 132/52     Pulse Rate 05/06/16 1041 88     Resp 05/06/16 1041 18     Temp 05/06/16 1041 97.8 F (36.6 C)     Temp Source 05/06/16 1041 Oral     SpO2 05/06/16 1041 95 %     Weight 05/06/16 1042 185 lb (83.9 kg)     Height 05/06/16 1042 5\' 5"  (1.651 m)     Head Circumference --      Peak Flow --      Pain Score 05/06/16 1042 10     Pain Loc --      Pain Edu? --      Excl. in [place]? --      Constitutional: Alert and oriented.Crying out due to "sick" and abdominal pain. HEENT   Head: Normocephalic and atraumatic.      Eyes: Conjunctivae are normal. PERRL. Normal extraocular movements.      Ears:         Nose: No congestion/rhinnorhea.   Mouth/Throat: Mucous membranes are moist.   Neck: No stridor. Cardiovascular/Chest: Normal rate, regular rhythm.  No murmurs, rubs, or gallops. Respiratory: Normal respiratory effort without tachypnea nor retractions. Breath sounds are clear and equal bilaterally. No wheezes/rales/rhonchi. Gastrointestinal: Soft. No distention, no guarding, no rebound. Obese, mild lower soreness palpation.  Genitourinary/rectal:  Some external rectal hemorrhoids which are nonthrombosed or tender. Large amount of brown relatively soft stool was able to be removed by manual rectal fecal disimpaction. Some slightly firmer stool at fingertip. Musculoskeletal: Nontender with normal range of motion in  all extremities. No joint effusions.  No lower extremity tenderness.  No edema. Neurologic:  Normal speech and language. No gross or focal neurologic deficits are appreciated. Skin:  Skin is warm, dry and intact. No rash noted. Psychiatric: Mood and affect are normal. Speech and behavior are normal. Patient exhibits appropriate insight and judgment.   ____________________________________________  LABS (pertinent positives/negatives)  Labs Reviewed  BASIC METABOLIC PANEL - Abnormal; Notable for the following:       Result Value   Glucose, Bld 153 (*)    BUN 31 (*)    Creatinine, Ser 1.42 (*)    Calcium 8.8 (*)    GFR calc non Af Amer 31 (*)    GFR calc Af Amer 36 (*)    All other components within normal limits  CBC - Abnormal; Notable  for the following:    RBC 3.67 (*)    All other components within normal limits  TROPONIN I  URINALYSIS, COMPLETE (UACMP) WITH MICROSCOPIC    ____________________________________________    EKG I, Lisa Roca, MD, the attending physician have personally viewed and interpreted all ECGs.  87 bpm. Nonspecific intraventricular conduction delay. Normal axis. LVH. Nonspecific T-wave ____________________________________________  RADIOLOGY All Xrays were viewed by me. Imaging interpreted by Radiologist.  Chest and abdomen x-ray:  IMPRESSION: 1. No acute findings in the abdomen pelvis. No evidence of bowel obstruction, generalized adynamic ileus or free air. 2. Moderate increased stool in the rectum. 3. No acute cardiopulmonary disease. __________________________________________  PROCEDURES  Procedure(s) performed: Manual rectal fecal disimpaction. Performed by myself, Dr. Reita Cliche M.D. Patient tolerated procedure. Large amount of brown stool that was relatively soft was manually disimpacted. There was additional stool left above the tip or by fingertip could reach.  Critical Care performed:  None  ____________________________________________   ED COURSE / ASSESSMENT AND PLAN  Pertinent labs & imaging results that were available during my care of the patient were reviewed by me and considered in my medical decision making (see chart for details).   Katelyn [name] is here complaining of constipation and very uncomfortable. I was able to manually disimpact a fair amount of brown stool. Given the amount of pain I did elect to do a little bit further evaluation including imaging and laboratory studies.  On reevaluation, I reviewed laboratory studies are reassuring. Her x-ray is reassuring for no signs of acute obstruction or perforation. She was feeling much better sitting up asking to eat and drink and requesting to go home.  I discussed with her that I had initially ordered a urinalysis sample, and a chin and family were interested in checking this today.  UA neg for uti.  [place] for discharge.    CONSULTATIONS:   None   Patient / Family / Caregiver informed of clinical course, medical decision-making process, and agree with plan.   I discussed return precautions, follow-up instructions, and discharge instructions with patient and/or family.   ___________________________________________   FINAL CLINICAL IMPRESSION(S) / ED DIAGNOSES   Final diagnoses:  Constipation, unspecified constipation type              Note: This dictation was prepared with Dragon dictation. Any transcriptional errors that result from this process are unintentional    Lisa Roca, MD 05/06/16 [place], MD 05/06/16 [phone]

## 2016-05-06 NOTE — ED Triage Notes (Signed)
Patient presents to the ED with constipation x 1 week and weakness x 2 days.  Patient was attempting to use a bedside commode this morning and she fell.  Patient has taken castor oil to cause her to have a bowel movement but patient has not had a bowel movement so far.  Patient denies pain anywhere except her abdomen and denies head pain.  Patient lives at home with daughter and granddaughter.  Patient is in no obvious distress at this time.

## 2016-05-06 NOTE — Discharge Instructions (Signed)
You were evaluated for constipation, and had stool disimpaction here in the emergency department with some improvement. The rest of the exam and evaluation are reassuring. Return to the emergency department immediately for any worsening abdominal pain, black or bloody stools, vomiting, fever, or any other symptoms concerning to you.  If you have not had a bowel movement in 1 day, you may try over-the-counter milk of magnesia, use as directed on the label. Please follow up with your primary doctor or the [place] [facility].

## 2016-05-30 ENCOUNTER — Other Ambulatory Visit (INDEPENDENT_AMBULATORY_CARE_PROVIDER_SITE_OTHER): Payer: Medicare (Managed Care)

## 2016-05-30 ENCOUNTER — Ambulatory Visit (INDEPENDENT_AMBULATORY_CARE_PROVIDER_SITE_OTHER): Payer: Medicare (Managed Care) | Admitting: Vascular Surgery

## 2016-05-30 ENCOUNTER — Encounter (INDEPENDENT_AMBULATORY_CARE_PROVIDER_SITE_OTHER): Payer: Self-pay | Admitting: Vascular Surgery

## 2016-05-30 ENCOUNTER — Encounter (INDEPENDENT_AMBULATORY_CARE_PROVIDER_SITE_OTHER): Payer: Medicare (Managed Care)

## 2016-05-30 ENCOUNTER — Encounter (INDEPENDENT_AMBULATORY_CARE_PROVIDER_SITE_OTHER): Payer: Self-pay

## 2016-05-30 ENCOUNTER — Ambulatory Visit (INDEPENDENT_AMBULATORY_CARE_PROVIDER_SITE_OTHER): Payer: Self-pay | Admitting: Vascular Surgery

## 2016-05-30 ENCOUNTER — Other Ambulatory Visit (INDEPENDENT_AMBULATORY_CARE_PROVIDER_SITE_OTHER): Payer: Self-pay | Admitting: Vascular Surgery

## 2016-05-30 VITALS — BP 173/77 | HR 69 | Resp 16 | Wt 178.0 lb

## 2016-05-30 DIAGNOSIS — I739 Peripheral vascular disease, unspecified: Secondary | ICD-10-CM

## 2016-05-30 DIAGNOSIS — R739 Hyperglycemia, unspecified: Secondary | ICD-10-CM

## 2016-05-30 DIAGNOSIS — I10 Essential (primary) hypertension: Secondary | ICD-10-CM | POA: Diagnosis not present

## 2016-06-10 IMAGING — CR DG CHEST 2V
2 series · 2 of 2 positions shown · non-contrast
Comparison: 10/01/2014

CLINICAL DATA: EKG changes

EXAM:
CHEST  2 VIEW

[chest lat]
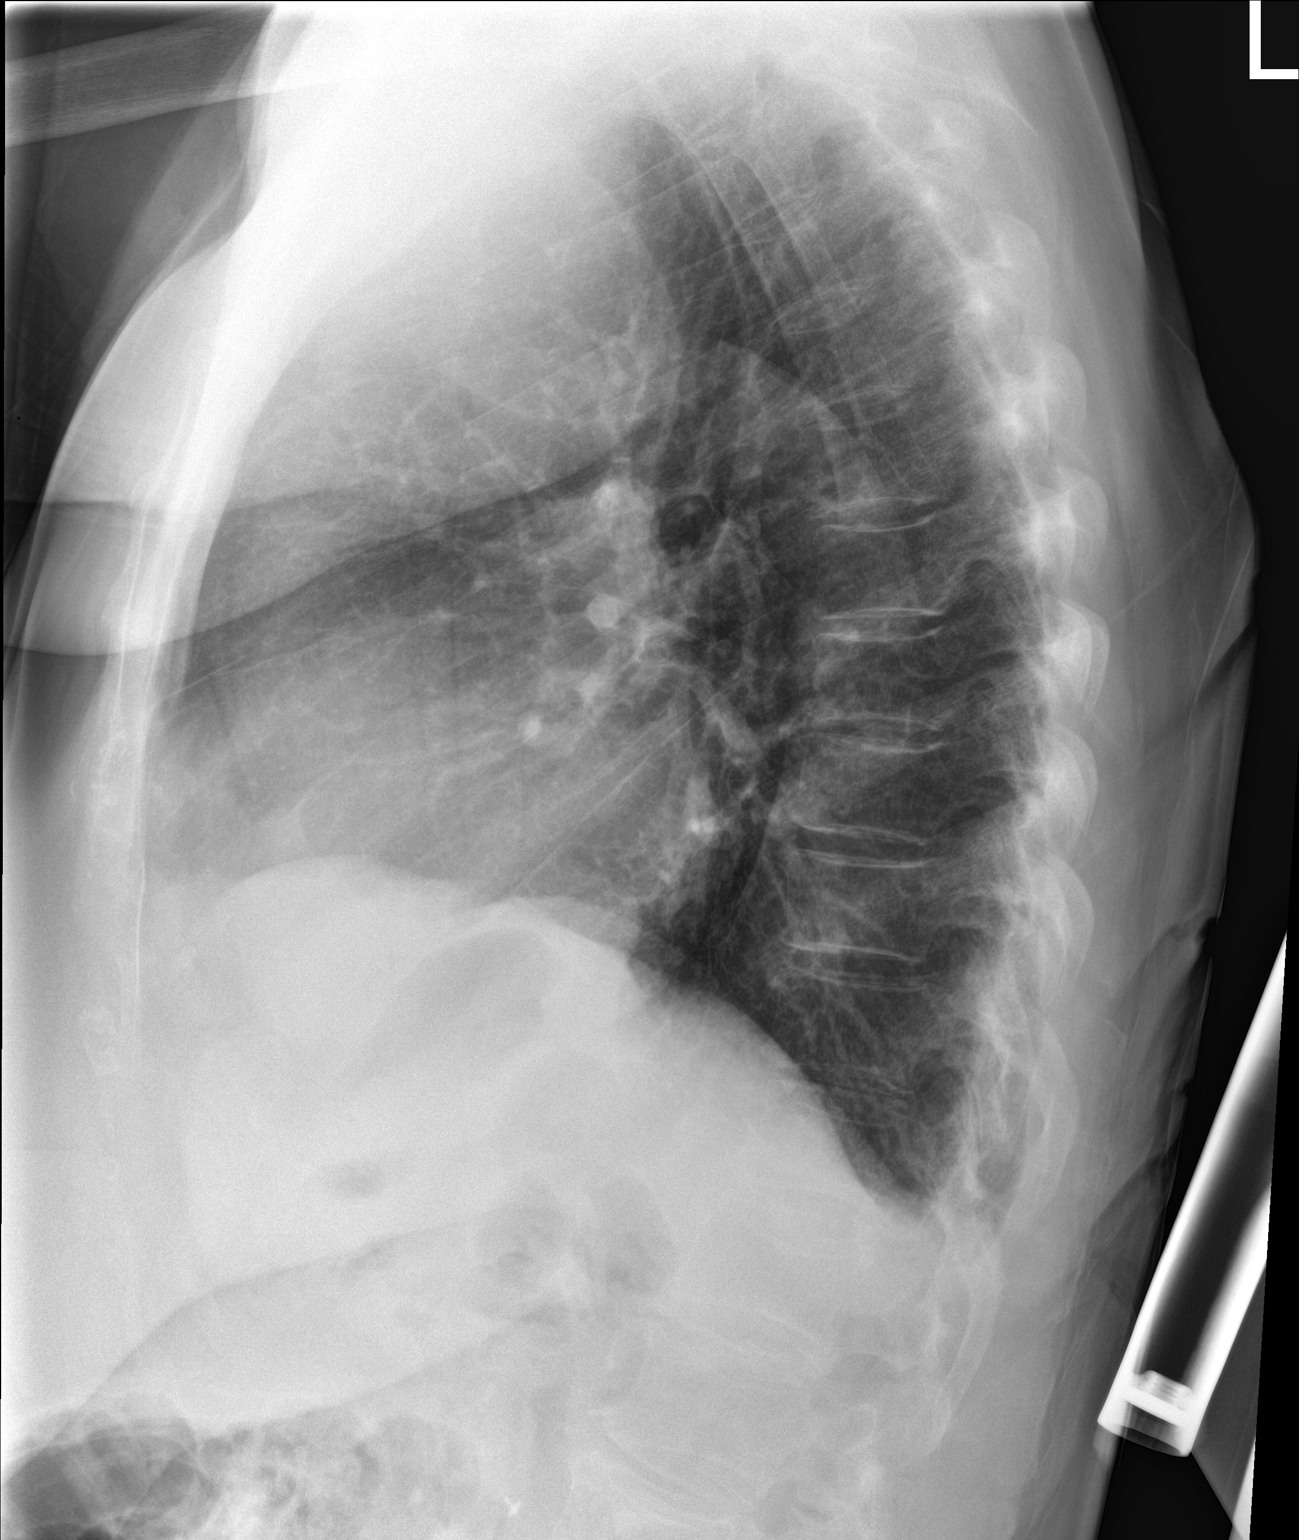

[chest ap]
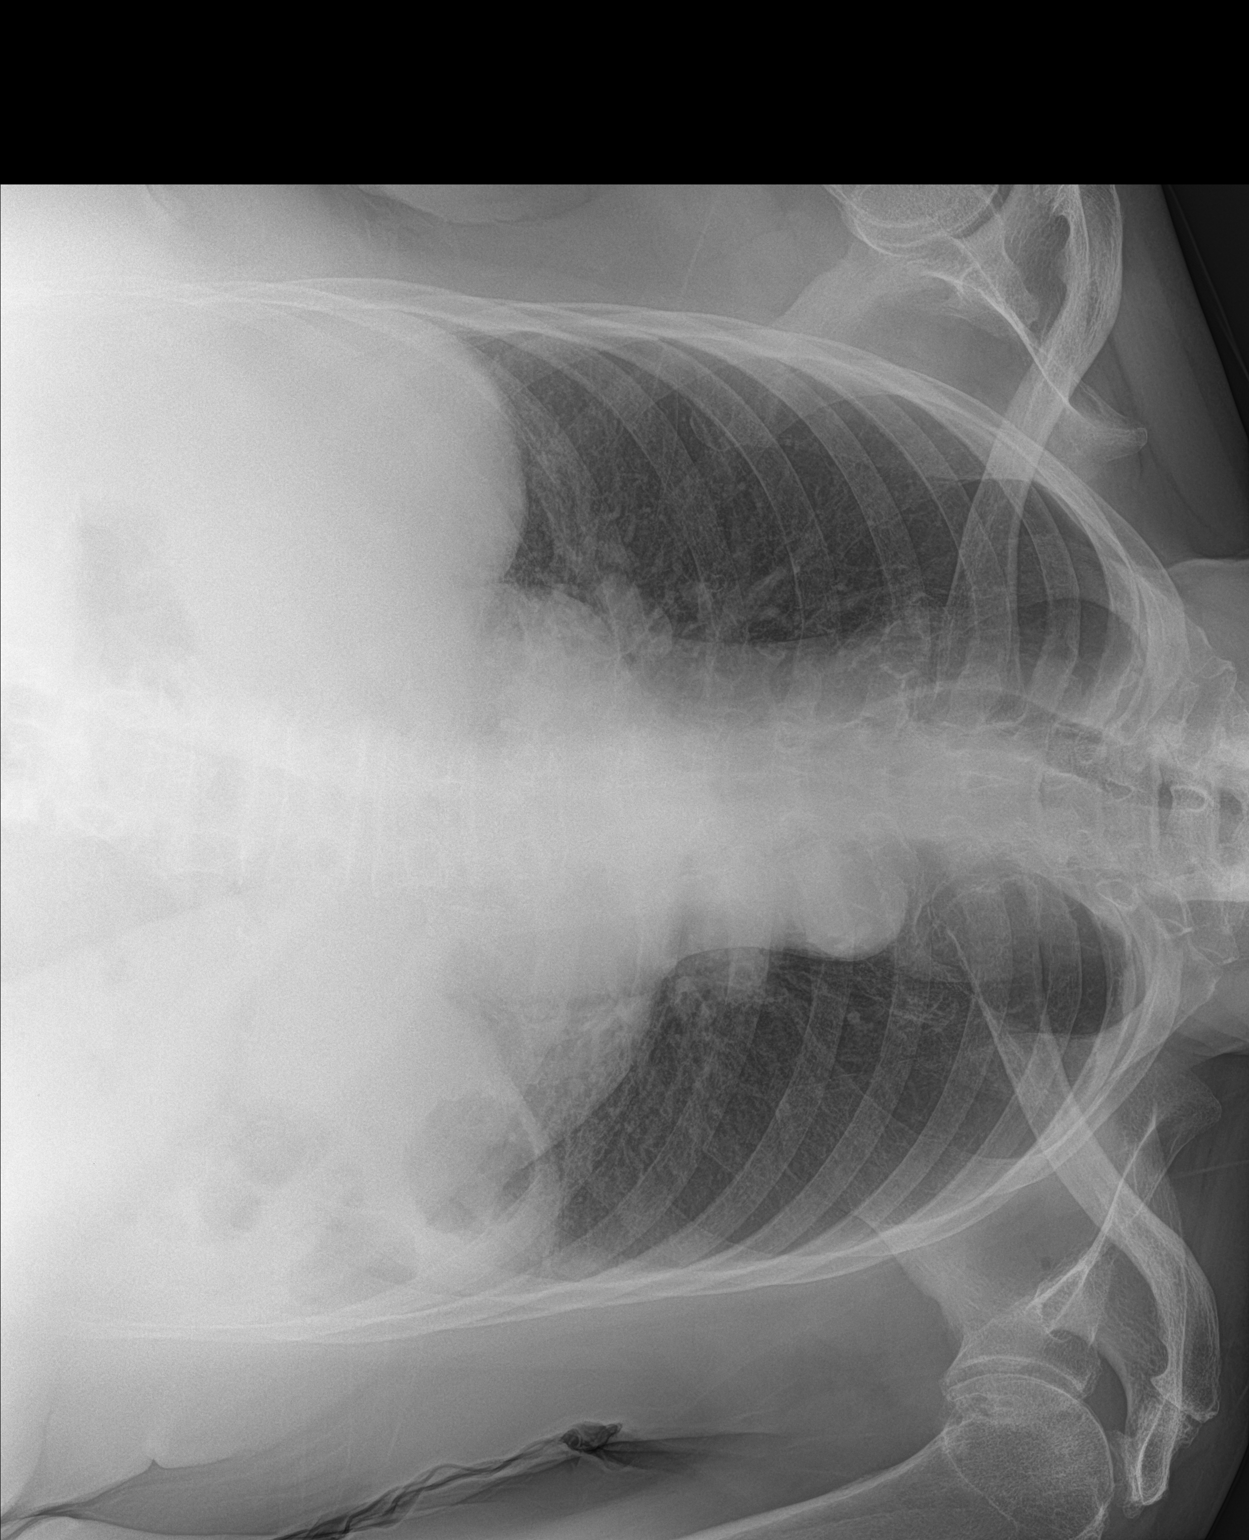

[2 of 2 positions shown; findings below may reference images not displayed]

FINDINGS: Hypoventilation with bibasilar atelectasis. Prominent heart size
without heart failure. Negative for edema or effusion. No mass
lesion.
IMPRESSION: Hypoventilation with mild bibasilar atelectasis.

## 2016-06-11 DIAGNOSIS — I739 Peripheral vascular disease, unspecified: Secondary | ICD-10-CM | POA: Insufficient documentation

## 2016-06-11 NOTE — Progress Notes (Signed)
Subjective:    Patient ID: Katelyn [name], female    DOB: Jan 29, 1924, 83 y.o.   MRN: [id number] Chief Complaint  Patient presents with  . Follow-up   Patient presents for a six month PAD follow up. The patient underwent an ABI which showed Right ABI: 1.09 and Left 1.28 (on 11/23/15, Right ABI: 1.01 and Left 1.04). The patient denies any claudication like symptoms, rest pain or ulcers to her feet / toes.    Review of Systems  All other systems reviewed and are negative.     Objective:   Physical Exam  Constitutional: She is oriented to person, place, and time. She appears well-developed. No distress.  HENT:  Head: Normocephalic and atraumatic.  Eyes: Conjunctivae are normal. Pupils are equal, round, and reactive to light.  Neck: Normal range of motion.  Cardiovascular: Normal rate, regular rhythm, normal heart sounds and intact distal pulses.   Pulses:      Radial pulses are 2+ on the right side, and 2+ on the left side.       Dorsalis pedis pulses are 2+ on the right side, and 2+ on the left side.       Posterior tibial pulses are 2+ on the right side, and 1+ on the left side.  Pulmonary/Chest: Effort normal.  Musculoskeletal: Normal range of motion. She exhibits no edema.  Neurological: She is alert and oriented to person, place, and time.  Skin: Skin is warm and dry. She is not diaphoretic.  Psychiatric: She has a normal mood and affect. Her behavior is normal. Judgment and thought content normal.  Vitals reviewed.  BP (!) 173/77   Pulse 69   Resp 16   Wt 178 lb (80.7 kg)   BMI 29.62 kg/m   Past Medical History:  Diagnosis Date  . Abdominal pain   . Anxiety   . Atherosclerosis   . Chronic kidney disease    stage 3  . Colon polyps   . Constipation   . Cough with expectoration   . Dependent edema bilateral legs  . Gait abnormality   . Gout   . Hearing loss   . Hearing loss   . Hemorrhoids   . Hypertension   . Incontinence   . Osteoarthritis   .  Osteoarthritis   . Osteoarthritis   . Paget's disease of bone   . Peripheral vascular disease ([place])   . Pneumonia   . Proteinuria    Social History   Social History  . Marital status: Widowed    Spouse name: N/A  . Number of children: N/A  . Years of education: N/A   Occupational History  . Not on file.   Social History Main Topics  . Smoking status: Never Smoker  . Smokeless tobacco: Never Used  . Alcohol use No  . Drug use: No  . Sexual activity: Not on file   Other Topics Concern  . Not on file   Social History Narrative  . No narrative on file   Past Surgical History:  Procedure Laterality Date  . PARTIAL HYSTERECTOMY    . PERIPHERAL VASCULAR CATHETERIZATION N/A 01/13/2015   Procedure: Abdominal Aortogram w/Lower Extremity;  Surgeon: Algernon Huxley, MD;  Location: [place] [facility];  Service: Cardiovascular;  Laterality: N/A;  . PERIPHERAL VASCULAR CATHETERIZATION  01/13/2015   Procedure: Lower Extremity Intervention;  Surgeon: Algernon Huxley, MD;  Location: [place] [facility];  Service: Cardiovascular;;   Family History  Problem Relation Age of  Onset  . Diabetes Mother   . Cancer Sister   . Cancer Sister   . Cancer Sister    Allergies  Allergen Reactions  . Bee Venom Anaphylaxis       Assessment & Plan:  Patient presents for a six month PAD follow up. The patient underwent an ABI which showed Right ABI: 1.09 and Left 1.28 (on 11/23/15, Right ABI: 1.01 and Left 1.04). The patient denies any claudication like symptoms, rest pain or ulcers to her feet / toes.   1. PAD (peripheral artery disease) ([place]) - Stable Studies reviewed with patient. Asymptomatic. ABI stable. No intervention indicated at this time.  Patient has numerous risk factors for PAD. Recommend six month follow up with ABI. Patient to remain abstinent of tobacco use. I have discussed with the patient at length the risk factors for and pathogenesis of atherosclerotic disease and encouraged a  healthy diet, regular exercise regimen and blood pressure / glucose control.  The patient was encouraged to call the office in the interim if he experiences any claudication like symptoms, rest pain or ulcers to her feet / toes.  - VAS [place] ABI WITH/WO TBI; Future  2. Essential (primary) hypertension - Stable Patient hypertensive today. Worrisome for already compromised renal function. Encouraged follow up with PCP or cardiologist. Patient is aware.  3. Hyperglycemia - Stable Encouraged good control as its slows the progression of atherosclerotic disease  Current Outpatient Prescriptions on File Prior to Visit  Medication Sig Dispense Refill  . amLODipine (NORVASC) 5 MG tablet Take 1 tablet (5 mg total) by mouth daily. 30 tablet 0  . apixaban (ELIQUIS) 2.5 MG TABS tablet Take 1 tablet (2.5 mg total) by mouth 2 (two) times daily. 60 tablet 3  . aspirin 81 MG chewable tablet Chew 1 tablet (81 mg total) by mouth daily.    . fluticasone (FLONASE) 50 MCG/ACT nasal spray Place 2 sprays into both nostrils daily. 16 g 5  . hydrocortisone (ANUSOL-HC) 2.5 % rectal cream Place 1 application rectally 2 (two) times daily as needed for hemorrhoids.    [employer] losartan (COZAAR) 50 MG tablet Take 50 mg by mouth daily.    . metoprolol tartrate (LOPRESSOR) 25 MG tablet Take 0.5 tablets (12.5 mg total) by mouth 2 (two) times daily. 30 tablet 0  . polyethylene glycol (MIRALAX / GLYCOLAX) packet Take 17 g by mouth daily.    . pravastatin (PRAVACHOL) 20 MG tablet Take 1 tablet (20 mg total) by mouth daily at 6 PM. 30 tablet 0   No current facility-administered medications on file prior to visit.     There are no Patient Instructions on file for this visit. No Follow-up on file.   KIMBERLY A STEGMAYER, PA-C

## 2016-06-22 IMAGING — CR DG ABDOMEN ACUTE W/ 1V CHEST
4 series · 4 of 4 positions shown · non-contrast
Comparison: 10/13/2014.

CLINICAL DATA: Vomiting. Abdominal pain. Generalized abdominal pain
with vomiting. Onset of symptoms last night.

EXAM:
DG ABDOMEN ACUTE W/ 1V CHEST

[chest pa]
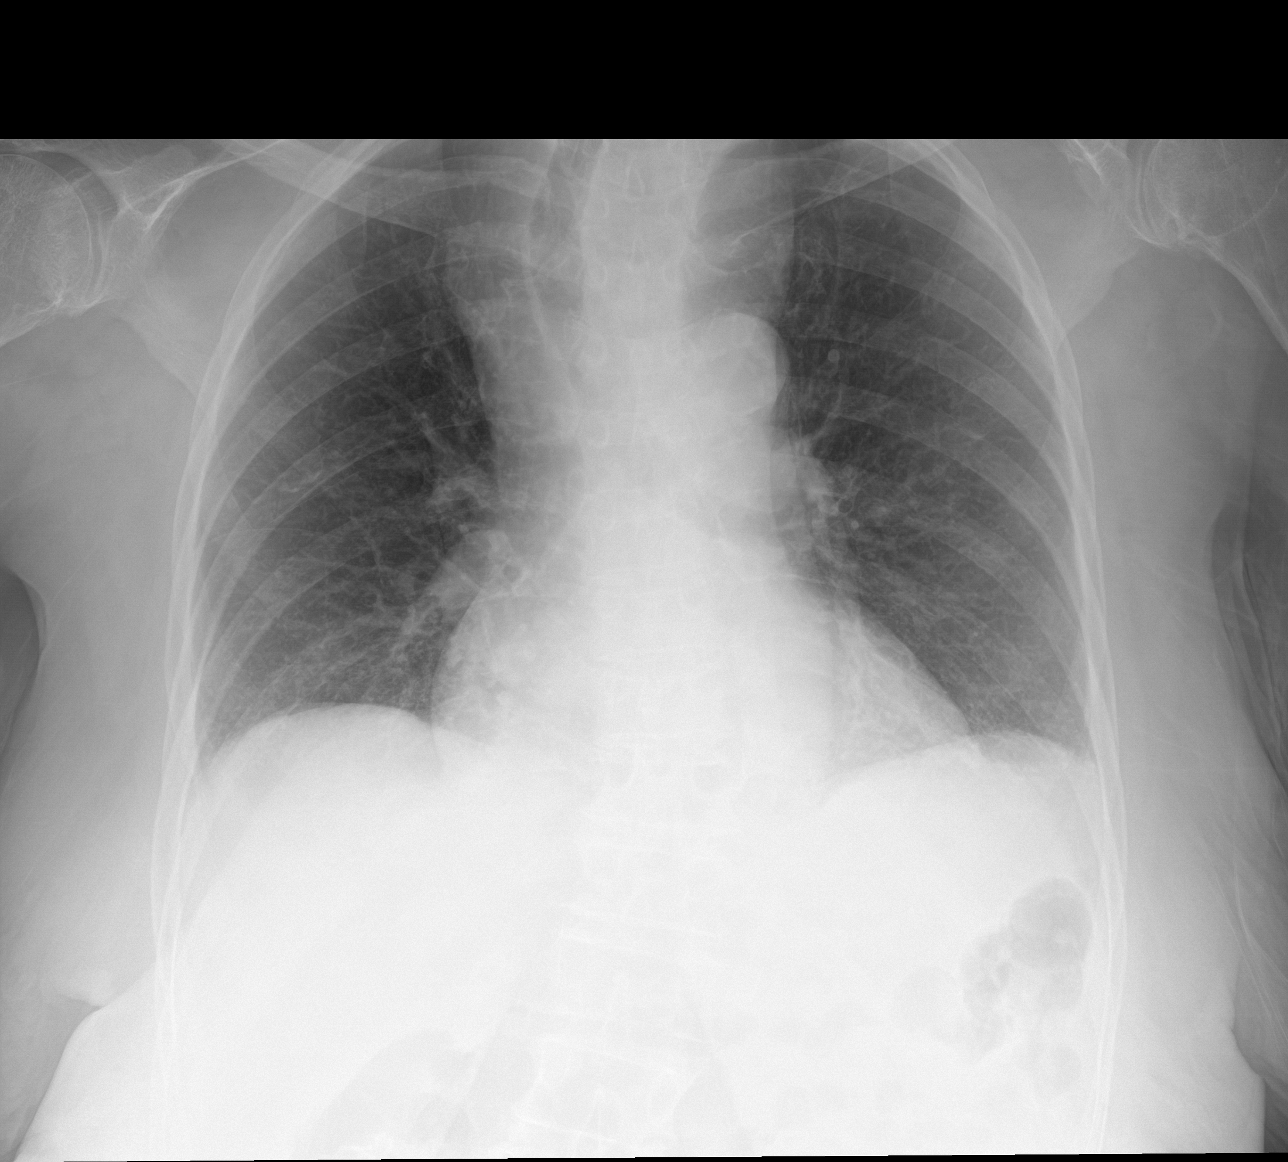

[abdomen erect]
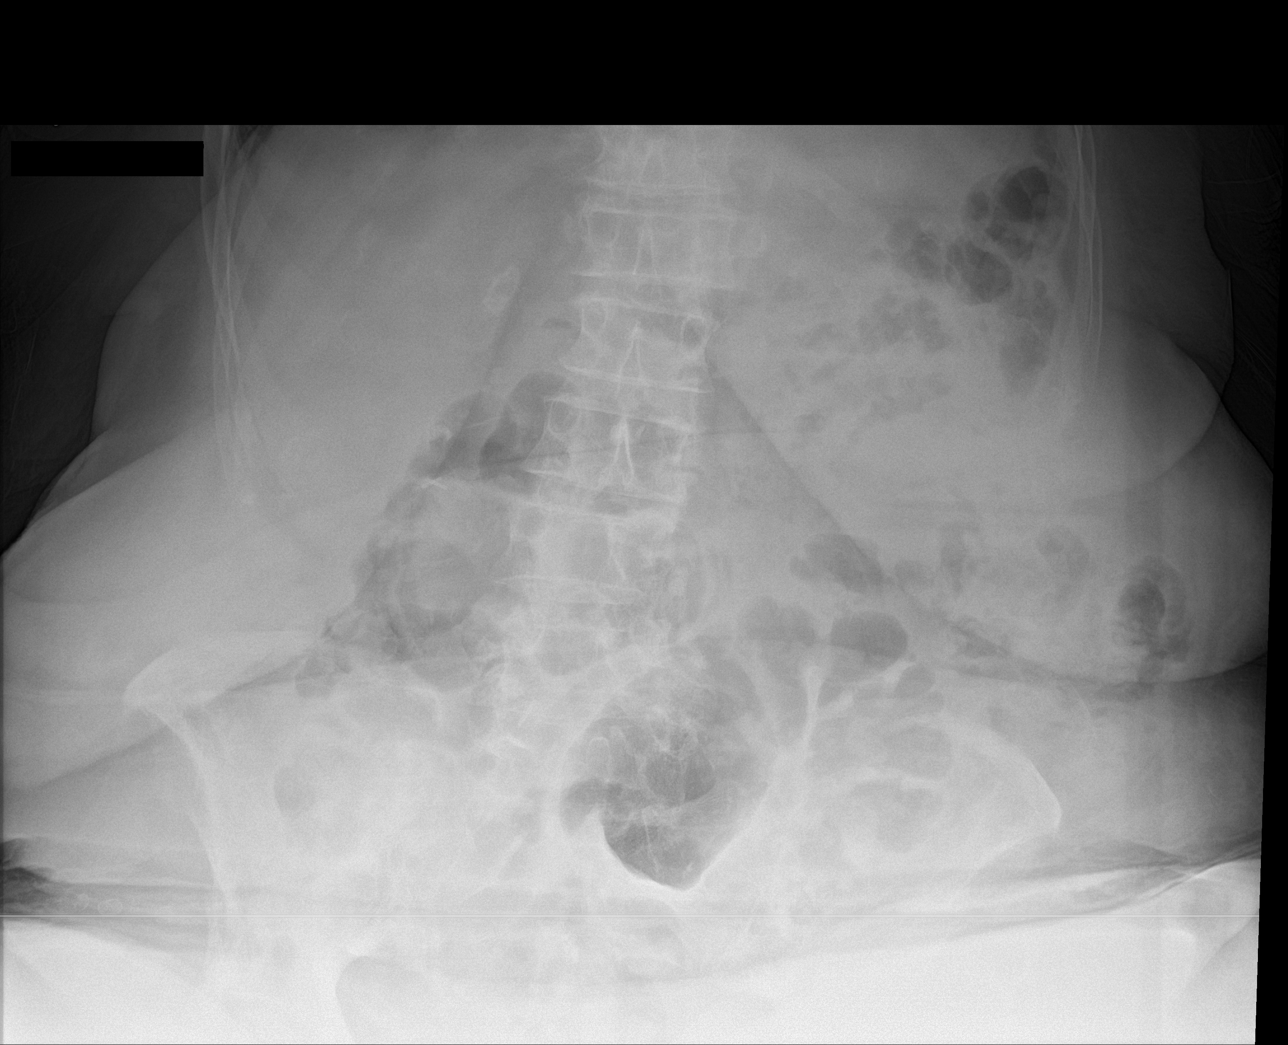

[abdomen supine (1 of 2)]
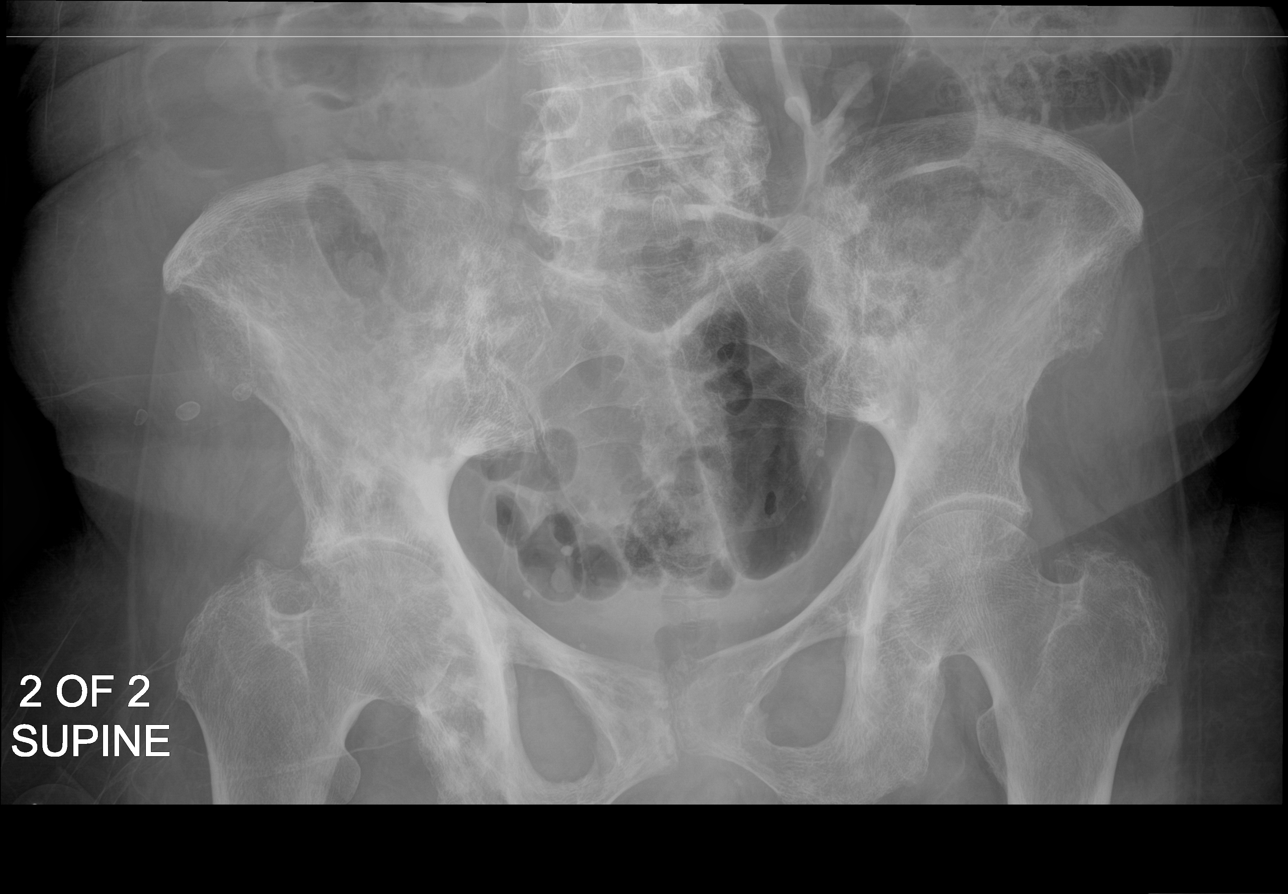

[abdomen supine (2 of 2)]
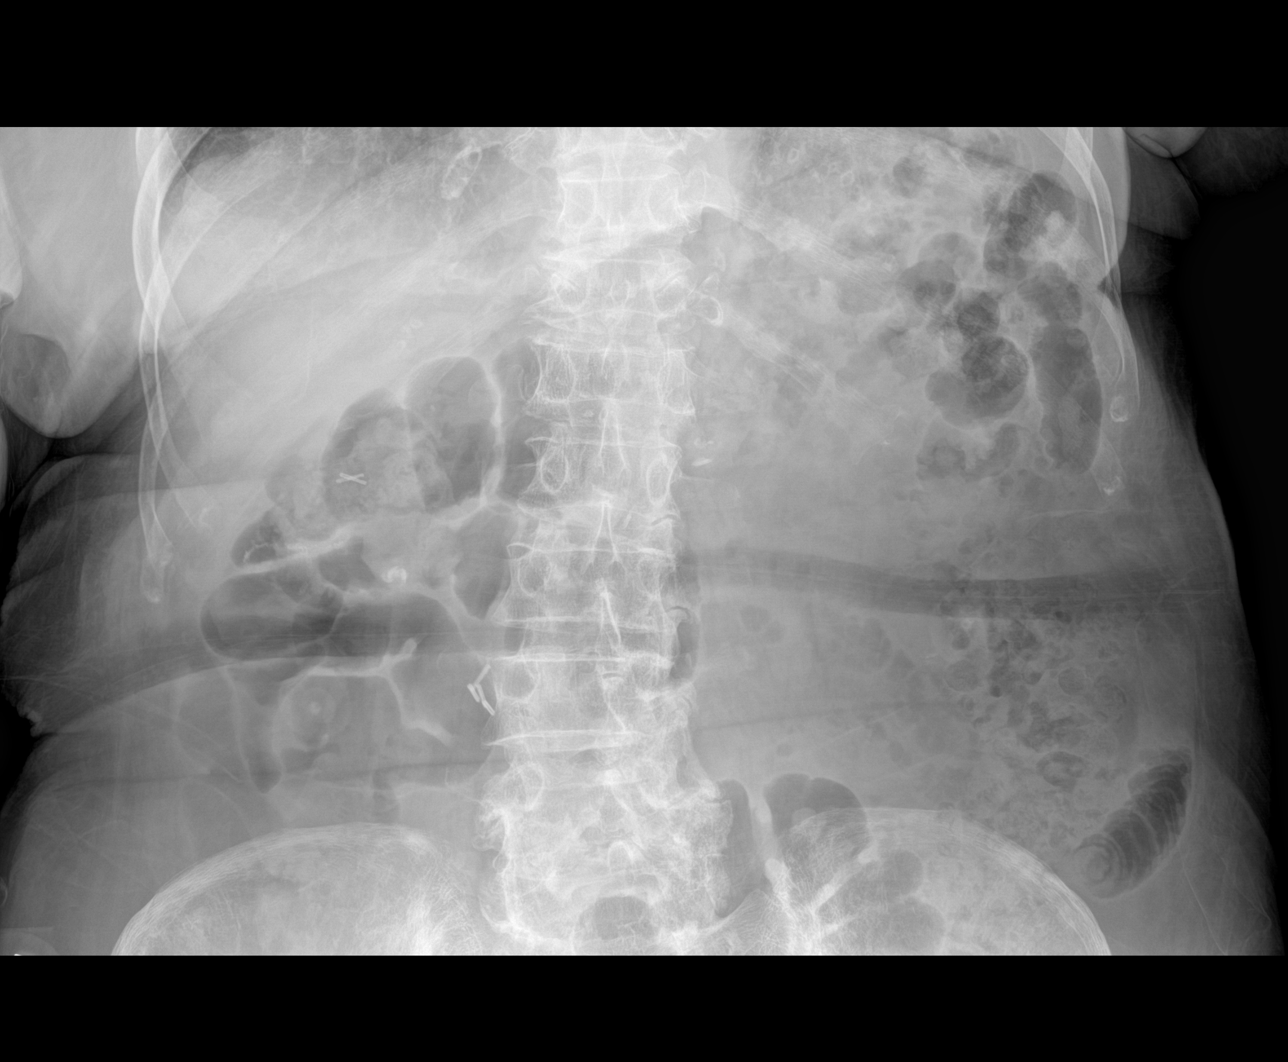

[4 of 4 positions shown; findings below may reference images not displayed]

FINDINGS: Low volume chest. Low volumes accentuate the cardiopericardial
silhouette which may be enlarged. Aortic arch atherosclerosis.
Basilar atelectasis. No airspace disease. No pleural effusion. Bowel
gas pattern is nonobstructive and within normal limits. Sclerosis of
the RIGHT bony pelvis is present compatible with Paget's disease.
Trabecular thickening with cortical thickening is present.
Metastatic disease could also produce this appearance but the
cortical thickening is more characteristic of Paget's disease. The
appearance is similar to prior exams.
IMPRESSION: 1. Low volume chest.
2. Normal bowel gas pattern.
3. RIGHT-greater-than-LEFT makes like cysts and sclerosis of the
bony pelvis compatible with Paget's disease.

## 2016-07-04 ENCOUNTER — Encounter (INDEPENDENT_AMBULATORY_CARE_PROVIDER_SITE_OTHER): Payer: Self-pay

## 2016-07-04 ENCOUNTER — Ambulatory Visit (INDEPENDENT_AMBULATORY_CARE_PROVIDER_SITE_OTHER): Payer: Self-pay | Admitting: Vascular Surgery

## 2016-11-30 ENCOUNTER — Encounter (INDEPENDENT_AMBULATORY_CARE_PROVIDER_SITE_OTHER): Payer: Medicare (Managed Care)

## 2016-11-30 ENCOUNTER — Ambulatory Visit (INDEPENDENT_AMBULATORY_CARE_PROVIDER_SITE_OTHER): Payer: Medicare (Managed Care) | Admitting: Vascular Surgery

## 2016-12-04 ENCOUNTER — Encounter (INDEPENDENT_AMBULATORY_CARE_PROVIDER_SITE_OTHER): Payer: Self-pay | Admitting: Vascular Surgery

## 2016-12-04 ENCOUNTER — Ambulatory Visit (INDEPENDENT_AMBULATORY_CARE_PROVIDER_SITE_OTHER): Payer: Medicare (Managed Care)

## 2016-12-04 ENCOUNTER — Ambulatory Visit (INDEPENDENT_AMBULATORY_CARE_PROVIDER_SITE_OTHER): Payer: Medicare (Managed Care) | Admitting: Vascular Surgery

## 2016-12-04 VITALS — BP 144/72 | HR 70 | Resp 16 | Wt 169.0 lb

## 2016-12-04 DIAGNOSIS — I739 Peripheral vascular disease, unspecified: Secondary | ICD-10-CM

## 2016-12-04 DIAGNOSIS — I10 Essential (primary) hypertension: Secondary | ICD-10-CM | POA: Diagnosis not present

## 2016-12-04 NOTE — Patient Instructions (Signed)

## 2016-12-04 NOTE — Progress Notes (Signed)
MRN : [id number]  Katelyn [name] is a 83 y.o. (06/11/1923) female who presents with chief complaint of  Chief Complaint  Patient presents with  . Follow-up    4mo ABI  .  History of Present Illness: Patient returns today in follow up of PAD.  She is s/p RLE revascularization in the past.  NO new symptoms of rest pain, tissue loss, or lifestyle limiting claudication.  Her ABIs today are 1.2 on the right and 1.1 on the left with good waveforms and nearly normal digital pressures bilaterally.    Current Outpatient Prescriptions  Medication Sig Dispense Refill  . amLODipine (NORVASC) 5 MG tablet Take 1 tablet (5 mg total) by mouth daily. 30 tablet 0  . apixaban (ELIQUIS) 2.5 MG TABS tablet Take 1 tablet (2.5 mg total) by mouth 2 (two) times daily. 60 tablet 3  . aspirin 81 MG chewable tablet Chew 1 tablet (81 mg total) by mouth daily.    . fluticasone (FLONASE) 50 MCG/ACT nasal spray Place 2 sprays into both nostrils daily. 16 g 5  . hydrocortisone (ANUSOL-HC) 2.5 % rectal cream Place 1 application rectally 2 (two) times daily as needed for hemorrhoids.    [employer] losartan (COZAAR) 50 MG tablet Take 50 mg by mouth daily.    . metoprolol tartrate (LOPRESSOR) 25 MG tablet Take 0.5 tablets (12.5 mg total) by mouth 2 (two) times daily. 30 tablet 0  . polyethylene glycol (MIRALAX / GLYCOLAX) packet Take 17 g by mouth daily.    . pravastatin (PRAVACHOL) 20 MG tablet Take 1 tablet (20 mg total) by mouth daily at 6 PM. 30 tablet 0   No current facility-administered medications for this visit.     Past Medical History:  Diagnosis Date  . Abdominal pain   . Anxiety   . Atherosclerosis   . Chronic kidney disease    stage 3  . Colon polyps   . Constipation   . Cough with expectoration   . Dependent edema bilateral legs  . Gait abnormality   . Gout   . Hearing loss   . Hearing loss   . Hemorrhoids   . Hypertension   . Incontinence   . Osteoarthritis   . Osteoarthritis   .  Osteoarthritis   . Paget's disease of bone   . Peripheral vascular disease ([place])   . Pneumonia   . Proteinuria     Past Surgical History:  Procedure Laterality Date  . PARTIAL HYSTERECTOMY    . PERIPHERAL VASCULAR CATHETERIZATION N/A 01/13/2015   Procedure: Abdominal Aortogram w/Lower Extremity;  Surgeon: Algernon Huxley, MD;  Location: [place] [facility];  Service: Cardiovascular;  Laterality: N/A;  . PERIPHERAL VASCULAR CATHETERIZATION  01/13/2015   Procedure: Lower Extremity Intervention;  Surgeon: Algernon Huxley, MD;  Location: [place] [facility];  Service: Cardiovascular;;    Social History Social History  Substance Use Topics  . Smoking status: Never Smoker  . Smokeless tobacco: Never Used  . Alcohol use No      Family History Family History  Problem Relation Age of Onset  . Diabetes Mother   . Cancer Sister   . Cancer Sister   . Cancer Sister      Allergies  Allergen Reactions  . Bee Venom Anaphylaxis     REVIEW OF SYSTEMS (Negative unless checked)  Constitutional: [] Weight loss  [] Fever  [] Chills Cardiac: [] Chest pain   [] Chest pressure   [] Palpitations   [] Shortness of breath when laying flat   []   Shortness of breath at rest   [] Shortness of breath with exertion. Vascular:  [] Pain in legs with walking   [] Pain in legs at rest   [] Pain in legs when laying flat   [] Claudication   [] Pain in feet when walking  [] Pain in feet at rest  [] Pain in feet when laying flat   [] History of DVT   [] Phlebitis   [x] Swelling in legs   [] Varicose veins   [] Non-healing ulcers Pulmonary:   [] Uses home oxygen   [] Productive cough   [] Hemoptysis   [] Wheeze  [] COPD   [] Asthma Neurologic:  [] Dizziness  [] Blackouts   [] Seizures   [] History of stroke   [] History of TIA  [] Aphasia   [] Temporary blindness   [] Dysphagia   [] Weakness or numbness in arms   [] Weakness or numbness in legs Musculoskeletal:  [x] Arthritis   [] Joint swelling   [] Joint pain   [] Low back pain Hematologic:  [] Easy  bruising  [] Easy bleeding   [] Hypercoagulable state   [] Anemic   Gastrointestinal:  [] Blood in stool   [] Vomiting blood  [] Gastroesophageal reflux/heartburn   [] Abdominal pain Genitourinary:  [] Chronic kidney disease   [] Difficult urination  [] Frequent urination  [] Burning with urination   [] Hematuria Skin:  [] Rashes   [] Ulcers   [] Wounds Psychological:  [] History of anxiety   []  History of major depression.  Physical Examination  BP (!) 144/72 (BP Location: Right Arm)   Pulse 70   Resp 16   Wt 169 lb (76.7 kg)   BMI 28.12 kg/m  Gen:  WD/WN, NAD. Appears younger than stated age. Head: [place]/AT, No temporalis wasting. Ear/Nose/Throat: Hearing grossly intact, nares w/o erythema or drainage, trachea midline Eyes: Conjunctiva clear. Sclera non-icteric Neck: Supple.  No JVD.  Pulmonary:  Good air movement, no use of accessory muscles.  Cardiac: irregular Vascular:  Vessel Right Left  Radial Palpable Palpable                          PT Palpable 1+ Palpable  DP 1+ Palpable Palpable    Musculoskeletal: M/S 5/5 throughout.  No deformity or atrophy. Trace LE edema. Walks with a walker Neurologic: Sensation grossly intact in extremities.  Symmetrical.  Speech is fluent.  Psychiatric: Judgment intact, Mood & affect appropriate for pt's clinical situation. Dermatologic: No rashes or ulcers noted.  No cellulitis or open wounds.       Labs No results found for this or any previous visit (from the past 2160 hour(s)).  Radiology No results found.    Assessment/Plan  Essential (primary) hypertension blood pressure control important in reducing the progression of atherosclerotic disease. On appropriate oral medications.   PAD (peripheral artery disease) ([facility]) Her ABIs today are 1.2 on the right and 1.1 on the left with good waveforms and nearly normal digital pressures bilaterally. She is doing well.  No worrisome symptoms.  Recheck in 6 months.  Continue current medical  regimen.    Leotis Pain, MD  12/04/2016 2:40 PM    This note was created with Dragon medical transcription system.  Any errors from dictation are purely unintentional

## 2016-12-04 NOTE — Assessment & Plan Note (Signed)
Her ABIs today are 1.2 on the right and 1.1 on the left with good waveforms and nearly normal digital pressures bilaterally. She is doing well.  No worrisome symptoms.  Recheck in 6 months.  Continue current medical regimen.

## 2016-12-04 NOTE — Assessment & Plan Note (Signed)
blood pressure control important in reducing the progression of atherosclerotic disease. On appropriate oral medications.  

## 2017-01-27 ENCOUNTER — Inpatient Hospital Stay
Admission: EM | Admit: 2017-01-27 | Discharge: 2017-01-29 | DRG: 069 | Disposition: A | Discharge status: Home / Self Care | Payer: No Typology Code available for payment source | Attending: Internal Medicine | Admitting: Internal Medicine

## 2017-01-27 ENCOUNTER — Emergency Department: Payer: No Typology Code available for payment source

## 2017-01-27 ENCOUNTER — Inpatient Hospital Stay
Admit: 2017-01-27 | Discharge: 2017-01-27 | Disposition: A | Discharge status: Home / Self Care | Payer: No Typology Code available for payment source | Attending: Internal Medicine | Admitting: Internal Medicine

## 2017-01-27 ENCOUNTER — Other Ambulatory Visit: Payer: Self-pay

## 2017-01-27 ENCOUNTER — Encounter: Payer: Self-pay | Admitting: Emergency Medicine

## 2017-01-27 DIAGNOSIS — Z7901 Long term (current) use of anticoagulants: Secondary | ICD-10-CM

## 2017-01-27 DIAGNOSIS — Z7982 Long term (current) use of aspirin: Secondary | ICD-10-CM | POA: Diagnosis not present

## 2017-01-27 DIAGNOSIS — R402142 Coma scale, eyes open, spontaneous, at arrival to emergency department: Secondary | ICD-10-CM | POA: Diagnosis present

## 2017-01-27 DIAGNOSIS — G459 Transient cerebral ischemic attack, unspecified: Secondary | ICD-10-CM | POA: Diagnosis not present

## 2017-01-27 DIAGNOSIS — E0590 Thyrotoxicosis, unspecified without thyrotoxic crisis or storm: Secondary | ICD-10-CM | POA: Diagnosis present

## 2017-01-27 DIAGNOSIS — R739 Hyperglycemia, unspecified: Secondary | ICD-10-CM | POA: Diagnosis present

## 2017-01-27 DIAGNOSIS — R402362 Coma scale, best motor response, obeys commands, at arrival to emergency department: Secondary | ICD-10-CM | POA: Diagnosis present

## 2017-01-27 DIAGNOSIS — R0681 Apnea, not elsewhere classified: Secondary | ICD-10-CM | POA: Diagnosis present

## 2017-01-27 DIAGNOSIS — R402252 Coma scale, best verbal response, oriented, at arrival to emergency department: Secondary | ICD-10-CM | POA: Diagnosis present

## 2017-01-27 DIAGNOSIS — I63519 Cerebral infarction due to unspecified occlusion or stenosis of unspecified middle cerebral artery: Secondary | ICD-10-CM | POA: Diagnosis not present

## 2017-01-27 DIAGNOSIS — Z86010 Personal history of colonic polyps: Secondary | ICD-10-CM | POA: Diagnosis not present

## 2017-01-27 DIAGNOSIS — R29704 NIHSS score 4: Secondary | ICD-10-CM | POA: Diagnosis present

## 2017-01-27 DIAGNOSIS — Z66 Do not resuscitate: Secondary | ICD-10-CM | POA: Diagnosis present

## 2017-01-27 DIAGNOSIS — Z8673 Personal history of transient ischemic attack (TIA), and cerebral infarction without residual deficits: Secondary | ICD-10-CM | POA: Diagnosis not present

## 2017-01-27 DIAGNOSIS — I129 Hypertensive chronic kidney disease with stage 1 through stage 4 chronic kidney disease, or unspecified chronic kidney disease: Secondary | ICD-10-CM | POA: Diagnosis present

## 2017-01-27 DIAGNOSIS — Z91030 Bee allergy status: Secondary | ICD-10-CM

## 2017-01-27 DIAGNOSIS — D6859 Other primary thrombophilia: Secondary | ICD-10-CM | POA: Diagnosis present

## 2017-01-27 DIAGNOSIS — Z79899 Other long term (current) drug therapy: Secondary | ICD-10-CM | POA: Diagnosis not present

## 2017-01-27 DIAGNOSIS — I739 Peripheral vascular disease, unspecified: Secondary | ICD-10-CM | POA: Diagnosis present

## 2017-01-27 DIAGNOSIS — H9190 Unspecified hearing loss, unspecified ear: Secondary | ICD-10-CM | POA: Diagnosis present

## 2017-01-27 DIAGNOSIS — I10 Essential (primary) hypertension: Secondary | ICD-10-CM | POA: Diagnosis present

## 2017-01-27 DIAGNOSIS — I63511 Cerebral infarction due to unspecified occlusion or stenosis of right middle cerebral artery: Secondary | ICD-10-CM | POA: Diagnosis present

## 2017-01-27 DIAGNOSIS — F0390 Unspecified dementia without behavioral disturbance: Secondary | ICD-10-CM | POA: Diagnosis present

## 2017-01-27 DIAGNOSIS — N183 Chronic kidney disease, stage 3 (moderate): Secondary | ICD-10-CM | POA: Diagnosis present

## 2017-01-27 DIAGNOSIS — H518 Other specified disorders of binocular movement: Secondary | ICD-10-CM | POA: Diagnosis present

## 2017-01-27 DIAGNOSIS — M1990 Unspecified osteoarthritis, unspecified site: Secondary | ICD-10-CM | POA: Diagnosis present

## 2017-01-27 DIAGNOSIS — R4182 Altered mental status, unspecified: Secondary | ICD-10-CM | POA: Diagnosis not present

## 2017-01-27 DIAGNOSIS — I639 Cerebral infarction, unspecified: Secondary | ICD-10-CM | POA: Diagnosis present

## 2017-01-27 DIAGNOSIS — R4701 Aphasia: Secondary | ICD-10-CM | POA: Diagnosis present

## 2017-01-27 HISTORY — DX: Cerebral infarction, unspecified: I63.9

## 2017-01-27 LAB — URINALYSIS, COMPLETE (UACMP) WITH MICROSCOPIC
BILIRUBIN URINE: NEGATIVE
Bacteria, UA: NONE SEEN
GLUCOSE, UA: NEGATIVE mg/dL
HGB URINE DIPSTICK: NEGATIVE
KETONES UR: NEGATIVE mg/dL
LEUKOCYTES UA: NEGATIVE
NITRITE: NEGATIVE
PH: 7 (ref 5.0–8.0)
PROTEIN: NEGATIVE mg/dL
RBC / HPF: NONE SEEN RBC/hpf (ref 0–5)
Specific Gravity, Urine: 1.02 (ref 1.005–1.030)

## 2017-01-27 LAB — COMPREHENSIVE METABOLIC PANEL
ALBUMIN: 3.7 g/dL (ref 3.5–5.0)
ALK PHOS: 66 U/L (ref 38–126)
ALT: 9 U/L — AB (ref 14–54)
ANION GAP: 10 (ref 5–15)
AST: 21 U/L (ref 15–41)
BUN: 16 mg/dL (ref 6–20)
CALCIUM: 8.9 mg/dL (ref 8.9–10.3)
CO2: 24 mmol/L (ref 22–32)
CREATININE: 1.1 mg/dL — AB (ref 0.44–1.00)
Chloride: 107 mmol/L (ref 101–111)
GFR, EST AFRICAN AMERICAN: 49 mL/min — AB (ref >60)
GFR, EST NON AFRICAN AMERICAN: 43 mL/min — AB (ref >60)
Glucose, Bld: 175 mg/dL — ABNORMAL HIGH (ref 65–99)
Potassium: 3.3 mmol/L — ABNORMAL LOW (ref 3.5–5.1)
Sodium: 141 mmol/L (ref 135–145)
Total Bilirubin: 0.7 mg/dL (ref 0.3–1.2)
Total Protein: 6.5 g/dL (ref 6.5–8.1)

## 2017-01-27 LAB — DIFFERENTIAL
Basophils Absolute: 0 10*3/uL (ref 0–0.1)
Basophils Relative: 1 %
EOS ABS: 0.1 10*3/uL (ref 0–0.7)
EOS PCT: 1 %
LYMPHS ABS: 5.2 10*3/uL — AB (ref 1.0–3.6)
Lymphocytes Relative: 56 %
Monocytes Absolute: 0.5 10*3/uL (ref 0.2–0.9)
Monocytes Relative: 6 %
NEUTROS PCT: 36 %
Neutro Abs: 3.4 10*3/uL (ref 1.4–6.5)

## 2017-01-27 LAB — CBC
HCT: 36.5 % (ref 35.0–47.0)
HEMOGLOBIN: 12 g/dL (ref 12.0–16.0)
MCH: 32.5 pg (ref 26.0–34.0)
MCHC: 32.9 g/dL (ref 32.0–36.0)
MCV: 98.6 fL (ref 80.0–100.0)
PLATELETS: 236 10*3/uL (ref 150–440)
RBC: 3.7 MIL/uL — ABNORMAL LOW (ref 3.80–5.20)
RDW: 13.9 % (ref 11.5–14.5)
WBC: 9.2 10*3/uL (ref 3.6–11.0)

## 2017-01-27 LAB — PROTIME-INR
INR: 1.14
PROTHROMBIN TIME: 14.5 s (ref 11.4–15.2)

## 2017-01-27 LAB — HEMOGLOBIN A1C
HEMOGLOBIN A1C: 5.7 % — AB (ref 4.8–5.6)
MEAN PLASMA GLUCOSE: 116.89 mg/dL

## 2017-01-27 LAB — GLUCOSE, CAPILLARY
GLUCOSE-CAPILLARY: 132 mg/dL — AB (ref 65–99)
GLUCOSE-CAPILLARY: 80 mg/dL (ref 65–99)
Glucose-Capillary: 151 mg/dL — ABNORMAL HIGH (ref 65–99)
Glucose-Capillary: 112 mg/dL — ABNORMAL HIGH (ref 65–99)
Glucose-Capillary: 102 mg/dL — ABNORMAL HIGH (ref 65–99)

## 2017-01-27 LAB — TROPONIN I

## 2017-01-27 LAB — APTT: aPTT: 32 seconds (ref 24–36)

## 2017-01-27 MED ORDER — ONDANSETRON HCL 4 MG PO TABS: 4.0000 mg | ORAL_TABLET | Freq: Four times a day (QID) | ORAL | Status: DC | PRN

## 2017-01-27 MED ORDER — ACETAMINOPHEN 325 MG PO TABS: 650.0000 mg | ORAL_TABLET | Freq: Four times a day (QID) | ORAL | Status: DC | PRN

## 2017-01-27 MED ORDER — ORAL CARE MOUTH RINSE
15.0000 mL | Freq: Two times a day (BID) | OROMUCOSAL | Status: DC
  Administered 2017-01-28 – 2017-01-29 (×2): 15 mL via OROMUCOSAL

## 2017-01-27 MED ORDER — DONEPEZIL HCL 10 MG PO TABS
10.0000 mg | ORAL_TABLET | Freq: Every day | ORAL | Status: DC
  Administered 2017-01-27 – 2017-01-29 (×3): 10 mg via ORAL
  Filled 2017-01-27 (×3): qty 1

## 2017-01-27 MED ORDER — AMLODIPINE BESYLATE 5 MG PO TABS
5.0000 mg | ORAL_TABLET | Freq: Every day | ORAL | Status: DC
  Administered 2017-01-27 – 2017-01-29 (×3): 5 mg via ORAL
  Filled 2017-01-27 (×3): qty 1

## 2017-01-27 MED ORDER — IPRATROPIUM-ALBUTEROL 0.5-2.5 (3) MG/3ML IN SOLN
3.0000 mL | Freq: Four times a day (QID) | RESPIRATORY_TRACT | Status: DC
  Administered 2017-01-27: 17:00:00 3 mL via RESPIRATORY_TRACT
  Filled 2017-01-27: qty 3

## 2017-01-27 MED ORDER — IOPAMIDOL (ISOVUE-370) INJECTION 76%
75.0000 mL | Freq: Once | INTRAVENOUS | Status: AC | PRN
  Administered 2017-01-27: 75 mL via INTRAVENOUS

## 2017-01-27 MED ORDER — LOSARTAN POTASSIUM 50 MG PO TABS
50.0000 mg | ORAL_TABLET | Freq: Every day | ORAL | Status: DC
  Administered 2017-01-27 – 2017-01-29 (×3): 50 mg via ORAL
  Filled 2017-01-27 (×3): qty 1

## 2017-01-27 MED ORDER — METHIMAZOLE 5 MG PO TABS
5.0000 mg | ORAL_TABLET | Freq: Two times a day (BID) | ORAL | Status: DC
  Administered 2017-01-27 – 2017-01-29 (×5): 5 mg via ORAL
  Filled 2017-01-27 (×5): qty 1

## 2017-01-27 MED ORDER — POTASSIUM CHLORIDE IN NACL 20-0.9 MEQ/L-% IV SOLN
INTRAVENOUS | Status: DC
  Administered 2017-01-27 – 2017-01-28 (×2): via INTRAVENOUS
  Filled 2017-01-27 (×4): qty 1000

## 2017-01-27 MED ORDER — PANTOPRAZOLE SODIUM 40 MG IV SOLR
40.0000 mg | Freq: Two times a day (BID) | INTRAVENOUS | Status: DC
Start: 2017-01-27 — End: 2017-01-28
  Administered 2017-01-27 – 2017-01-28 (×3): 40 mg via INTRAVENOUS
  Filled 2017-01-27 (×3): qty 40

## 2017-01-27 MED ORDER — FLUTICASONE PROPIONATE 50 MCG/ACT NA SUSP
2.0000 | Freq: Every day | NASAL | Status: DC
  Administered 2017-01-27 – 2017-01-29 (×3): 2 via NASAL
  Filled 2017-01-27: qty 16

## 2017-01-27 MED ORDER — DOCUSATE SODIUM 100 MG PO CAPS
100.0000 mg | ORAL_CAPSULE | Freq: Two times a day (BID) | ORAL | Status: DC
  Administered 2017-01-27 – 2017-01-29 (×5): 100 mg via ORAL
  Filled 2017-01-27 (×5): qty 1

## 2017-01-27 MED ORDER — ASPIRIN 300 MG RE SUPP
300.0000 mg | Freq: Once | RECTAL | Status: AC
  Administered 2017-01-27: 300 mg via RECTAL
  Filled 2017-01-27: qty 1

## 2017-01-27 MED ORDER — ASPIRIN EC 81 MG PO TBEC
81.0000 mg | DELAYED_RELEASE_TABLET | Freq: Every day | ORAL | Status: DC
  Administered 2017-01-27 – 2017-01-28 (×2): 81 mg via ORAL
  Filled 2017-01-27 (×2): qty 1

## 2017-01-27 MED ORDER — ACETAMINOPHEN 650 MG RE SUPP
650.0000 mg | Freq: Four times a day (QID) | RECTAL | Status: DC | PRN
Start: 2017-01-27 — End: 2017-01-29

## 2017-01-27 MED ORDER — IPRATROPIUM-ALBUTEROL 0.5-2.5 (3) MG/3ML IN SOLN: 3.0000 mL | Freq: Four times a day (QID) | RESPIRATORY_TRACT | Status: DC | PRN

## 2017-01-27 MED ORDER — APIXABAN 2.5 MG PO TABS
2.5000 mg | ORAL_TABLET | Freq: Two times a day (BID) | ORAL | Status: DC
  Administered 2017-01-27 – 2017-01-29 (×5): 2.5 mg via ORAL
  Filled 2017-01-27 (×5): qty 1

## 2017-01-27 MED ORDER — BISACODYL 10 MG RE SUPP: 10.0000 mg | Freq: Every day | RECTAL | Status: DC | PRN

## 2017-01-27 MED ORDER — INSULIN ASPART 100 UNIT/ML ~~LOC~~ SOLN
0.0000 [IU] | SUBCUTANEOUS | Status: DC
  Administered 2017-01-27: 14:00:00 2 [IU] via SUBCUTANEOUS
  Filled 2017-01-27: qty 1

## 2017-01-27 MED ORDER — ONDANSETRON HCL 4 MG/2ML IJ SOLN: 4.0000 mg | Freq: Four times a day (QID) | INTRAMUSCULAR | Status: DC | PRN

## 2017-01-27 NOTE — ED Provider Notes (Signed)
[facility] Emergency Department Provider Note  ____________________________________________   First MD Initiated Contact with Patient 01/27/17 [phone]     (approximate)  I have reviewed the triage vital signs and the nursing notes.   HISTORY  Chief Complaint Altered Mental Status  EM caveat: The patient is unable to form comprehendible speech and is not following commands, altered mental status   HPI Katelyn [name] is a 83 y.o. female brought for evaluation for sudden onset of change, initially paged out to EMS as a possible "cardiac arrest" however the patient was found with pulse and very lethargic  EMS reports they arrived on scene, the patient had evidently been up to use the bathroom, was then found down by family member and was noted to have a fixed right-sided gaze deviation with somewhat pinpoint appearing pupils.  She was not hypoxic, and was respirating throughout without loss of pulse.  Blood sugar was checked and no normal range.  EMS brought her with concerns for a possible stroke, but also report unable to perform her stroke screen because of the patient being unable to interact  EMS reports the patient is noted to be on Eliquis has a history of a prior ischemic stroke Past Medical History:  Diagnosis Date  . Abdominal pain   . Anxiety   . Atherosclerosis   . Chronic kidney disease    stage 3  . Colon polyps   . Constipation   . Cough with expectoration   . Dependent edema bilateral legs  . Gait abnormality   . Gout   . Hearing loss   . Hearing loss   . Hemorrhoids   . Hypertension   . Incontinence   . Osteoarthritis   . Osteoarthritis   . Osteoarthritis   . Paget's disease of bone   . Peripheral vascular disease ([place])   . Pneumonia   . Proteinuria   . Stroke [facility])     Patient Active Problem List   Diagnosis Date Noted  . PAD (peripheral artery disease) ([place]) 06/11/2016  . CVA (cerebral infarction) 12/23/2014  . CKD  (chronic kidney disease) stage 3, GFR 30-59 ml/min (HCC) 10/20/2014  . Hyperglycemia 10/20/2014  . Anxiety, mild 07/05/2014  . Essential (primary) hypertension 07/05/2014  . Acquired hallux valgus 07/05/2014  . Gastritis, Helicobacter pylori 96/06/5407  . History of colon polyps 07/05/2014  . Arthritis of knee, degenerative 07/05/2014    Past Surgical History:  Procedure Laterality Date  . Abdominal Aortogram w/Lower Extremity N/A 01/13/2015   Performed by Algernon Huxley, MD at [facility]  . Lower Extremity Intervention  01/13/2015   Performed by Algernon Huxley, MD at [facility]  . PARTIAL HYSTERECTOMY      Prior to Admission medications   Medication Sig Start Date End Date Taking? Authorizing Provider  amLODipine (NORVASC) 5 MG tablet Take 1 tablet (5 mg total) by mouth daily. 12/27/14   Nicholes Mango, MD  apixaban (ELIQUIS) 2.5 MG TABS tablet Take 1 tablet (2.5 mg total) by mouth 2 (two) times daily. 01/13/15   Algernon Huxley, MD  aspirin 81 MG chewable tablet Chew 1 tablet (81 mg total) by mouth daily. 12/26/14   Gouru, Illene Silver, MD  fluticasone (FLONASE) 50 MCG/ACT nasal spray Place 2 sprays into both nostrils daily. 09/03/14   Glean Hess, MD  hydrocortisone (ANUSOL-HC) 2.5 % rectal cream Place 1 application rectally 2 (two) times daily as needed for hemorrhoids.    [provider]  losartan (COZAAR) 50 MG tablet Take 50 mg by mouth daily.    [provider]  metoprolol tartrate (LOPRESSOR) 25 MG tablet Take 0.5 tablets (12.5 mg total) by mouth 2 (two) times daily. 12/27/14   Gouru, Illene Silver, MD  polyethylene glycol (MIRALAX / GLYCOLAX) packet Take 17 g by mouth daily.    [provider]  pravastatin (PRAVACHOL) 20 MG tablet Take 1 tablet (20 mg total) by mouth daily at 6 PM. 12/26/14   Nicholes Mango, MD    Allergies Bee venom  Family History  Problem Relation Age of Onset  . Diabetes Mother   . Cancer Sister   . Cancer Sister   . Cancer  Sister     Social History Social History   Tobacco Use  . Smoking status: Never Smoker  . Smokeless tobacco: Never Used  Substance Use Topics  . Alcohol use: No    Alcohol/week: 0.0 oz  . Drug use: No    Review of Systems EM caveat  ____________________________________________   PHYSICAL EXAM:  VITAL SIGNS: ED Triage Vitals [01/27/17 0846]  Enc Vitals Group     BP (!) 154/78     Pulse Rate 69     Resp 19     Temp      Temp src      SpO2 100 %     Weight 165 lb (74.8 kg)     Height      Head Circumference      Peak Flow      Pain Score      Pain Loc      Pain Edu?      Excl. in [place]?     Constitutional: Patient moaning, will respond with incomprehensible sounds to verbal stimuli and will open her eyes.  She appears in no acute extremitas, but does appear acutely ill with altered mental status Eyes: Conjunctivae are normal.  The eyes are fixed and deviated to the right with pinpoint appearance of the pupils. Head: Atraumatic. Nose: No congestion/rhinnorhea. Mouth/Throat: Mucous membranes are moist. Neck: No stridor.   Cardiovascular: Normal rate, regular rhythm. Grossly normal heart sounds.  Good peripheral circulation. Respiratory: Normal respiratory effort.  No retractions. Lungs CTAB. Gastrointestinal: Soft and nontender. No distention. Musculoskeletal: No lower extremity tenderness nor edema. Neurologic: Fixed right-sided gaze deviation.  We will not follow commands.  Will try to mumble her make an comparable pencil sounds when spoken to, unclear if having significant dysarthria or if this may represent a aphasia.  She is witnessed to lift both hands against gravity, she will move both legs slightly, but cannot participate in exam and very difficult to assess of a focal abnormality is present Skin:  Skin is warm, dry and intact. No rash noted. Psychiatric: Cannot be assessed  ____________________________________________   LABS (all labs ordered are listed,  but only abnormal results are displayed)  Labs Reviewed  CBC - Abnormal; Notable for the following components:      Result Value   RBC 3.70 (*)    All other components within normal limits  DIFFERENTIAL - Abnormal; Notable for the following components:   Lymphs Abs 5.2 (*)    All other components within normal limits  COMPREHENSIVE METABOLIC PANEL - Abnormal; Notable for the following components:   Potassium 3.3 (*)    Glucose, Bld 175 (*)    Creatinine, Ser 1.10 (*)    ALT 9 (*)    GFR calc non Af Amer 43 (*)  GFR calc Af Amer 49 (*)    All other components within normal limits  GLUCOSE, CAPILLARY - Abnormal; Notable for the following components:   Glucose-Capillary 132 (*)    All other components within normal limits  PROTIME-INR  APTT  TROPONIN I  BLOOD GAS, VENOUS  URINALYSIS, COMPLETE (UACMP) WITH MICROSCOPIC  CBG MONITORING, ED   ____________________________________________  EKG  Reviewed and interpreted by me at 8:50 AM Heart rate 70 QRS 120 QTC 460 Probable normal sinus rhythm with occasional PACs without evidence of acute ischemic abnormality ____________________________________________  RADIOLOGY  Ct Head Code Stroke Wo Contrast  Result Date: 01/27/2017 CLINICAL DATA:  Code stroke. Fixed right-sided gaze. Lethargy. Altered mental status. EXAM: CT HEAD WITHOUT CONTRAST TECHNIQUE: Contiguous axial images were obtained from the base of the skull through the vertex without intravenous contrast. COMPARISON:  CT head without contrast 02/14/2015 FINDINGS: Brain: The a remote left parietal lobe infarct is stable. No acute cortical infarct is present. Moderate atrophy and diffuse white matter changes are otherwise stable. Ventricles are proportionate to the degree of atrophy. No significant extra-axial fluid collection is present. The brainstem and cerebellum are normal. Vascular: Atherosclerotic calcifications are present within the cavernous internal carotid artery is  bilaterally. There is no hyperdense vessel. Skull: Calvarium is intact. No focal lytic or blastic lesions are present. Sinuses/Orbits: The paranasal sinuses and mastoid air cells are clear. Globes and orbits are within normal limits bilaterally. ASPECTS [facility] Stroke Program Early CT Score) - Ganglionic level infarction (caudate, lentiform nuclei, internal capsule, insula, M1-M3 cortex): 7/7 - Supraganglionic infarction (M4-M6 cortex): 3/3 Total score (0-10 with 10 being normal): 10/10 IMPRESSION: 1. Stable remote left parietal lobe infarct. 2. No acute cortical infarct. 3. Stable atrophy and white matter disease. 4. ASPECTS is 10/10 These results were called by telephone at the time of interpretation on 01/27/2017 at 9:15 am to Dr. Delman Kitten , who verbally acknowledged these results. Electronically Signed   By: San Morelle M.D.   On: 01/27/2017 09:18    Reviewed CT findings, no evidence of acute hemorrhage ____________________________________________   PROCEDURES  Procedure(s) performed: None  Procedures  Critical Care performed: Yes, see critical care note(s)  CRITICAL CARE Performed by: Delman Kitten   Total critical care time: 45 minutes  Critical care time was exclusive of separately billable procedures and treating other patients.  Critical care was necessary to treat or prevent imminent or life-threatening deterioration.  Critical care was time spent personally by me on the following activities: development of treatment plan with patient and/or surrogate as well as nursing, discussions with consultants, evaluation of patient's response to treatment, examination of patient, obtaining history from patient or surrogate, ordering and performing treatments and interventions, ordering and review of laboratory studies, ordering and review of radiographic studies, pulse oximetry and re-evaluation of patient's condition.  ____________________________________________   INITIAL  IMPRESSION / ASSESSMENT AND PLAN / ED COURSE  Pertinent labs & imaging results that were available during my care of the patient were reviewed by me and considered in my medical decision making (see chart for details).  Sudden onset of mental status changes, fixed right-sided gaze deviation, difficulty speaking, and inability to follow commands suggestive of a receptive aphasia as well as possible right-sided stroke.  Clinical Course as of Jan 27 945  Nancy Fetter Jan 27, 2017  0850 Called emergency contact Joaquim Lai) at listed number. No answer.  [MQ]  0912 I have spoken with the patient's sister, her daughter at the bedside as well  as her other daughter via phone.  The family is in agreement that the patient had previously spoken that she did not wish to be resuscitated, if her status worsens I would aim for comforting her and would not want her to receive any heroic measures such as intubation, mechanical ventilation, or CPR.  She is DO NOT RESUSCITATE after discussing with both of her daughters who present themselves as her medical decision makers and closest family.  [MQ]  0932 Review of patient's blister packs, she appears to have taken her Eliquis last night.  Family reports she takes the medications as in the blister packs, and the dose from the evening of November 17 has been taken.  The patient is not a TPA candidate as she is believed to be actively anticoagulated on Eliquis.  However, she is a candidate potentially for treatment of a large vessel occlusion if CT angiography identified as such.  [MQ]  [phone] Differential diagnosis is broad, however in her acute stage at this point I am focused primarily on neurologic etiology, as we await laboratory testing.  I am concerned about the possibility of a large vessel occlusion or stroke.  Tele-neurology seeing patient  [MQ]  [phone] Spoke with Dr. Levan Hurst horn of radiology, reports the patient has a significant stenosis of the M1 segment of the middle  cerebral artery on the right.  This may be pertinent to her exam today, he reports that it is NOT be something that would be amenable to interventional therapy at this time. 'No LVO'  [MQ]    Clinical Course User Index [MQ] Delman Kitten, MD   ----------------------------------------- 9:43 AM on 01/27/2017 -----------------------------------------  Patient is now able to speak with somewhat clear sentences, her NIH score is 6.  She appears to be showing some evidence of improvement with increased level of alertness and improvement in her speech.  Reviewed her CT angiography results, discussed this with Dr. Tommi Rumps our neurologist from tele-neurology, he advises at this point that the patient is on Eliquis, he would give her rectal aspirin, and admit her for further workup.  She is not a candidate for neuro interventional therapy at this time.  Clearly the patient will require additional testing and confirmatory testing of this suspected stroke, my provisional diagnosis at this time is likely right sided middle cerebral artery ischemic infarct.  ____________________________________________   FINAL CLINICAL IMPRESSION(S) / ED DIAGNOSES  Final diagnoses:  Acute ischemic stroke ([place])  Altered mental status, unspecified altered mental status type      NEW MEDICATIONS STARTED DURING THIS VISIT:  This SmartLink is deprecated. Use AVSMEDLIST instead to display the medication list for a patient.   Note:  This document was prepared using Dragon voice recognition software and may include unintentional dictation errors.     Delman Kitten, MD 01/27/17 [phone]

## 2017-01-27 NOTE — ED Notes (Signed)
Family reports baseline is walking with walker, alert and oriented.

## 2017-01-27 NOTE — ED Notes (Signed)
Initial contact with code stroke consult

## 2017-01-27 NOTE — ED Notes (Signed)
Called code stroke to [phone]

## 2017-01-27 NOTE — Consult Note (Signed)
[facility] TeleNeurology Consult Services  Asked to see this patient in telemedicine consultation. Consultation was performed with assistance of ancillary / medical staff at bedside.  Impression: Stroke - Acute right gaze deviation (improved), altered mental status, possible mild expressive aphasia.  Differential Diagnosis:  1. Cardioembolic stroke  2. Small vessel disease/lacune  3. Thromboembolic, artery-to-artery mechanism  4. Hypercoagulable state-related infarct  5. Transient ischemic attack  6. Thrombotic mechanism, large artery disease   Presentation:   Symptoms: 83 year old woman presents via EMS from home after being found unresponsive in her bathroom with right gaze deviation and apnea.  PMH: HTN and peripheral vascular disease.  She is on Eliquis.   Last Known Well: 820   History provided by: EMS, family at bedside.   History of anticoagulation or other contraindications for thrombolytics: Yes, patient taking Eliquis.   Telestroke Assessment:  Patient appears as stated age. No obvious acute respiratory or cardiac distress. Patient is well groomed and well-nourished.  NIHSS score: NIH Stroke Scale/Score (NIHSS) on 01/27/2017  RESULT SUMMARY: 3 points NIH Stroke Scale   INPUTS: 1A: Level of consciousness -> 0 = Alert; keenly responsive 1B: Ask month and age -> 2 = 0 questions right  1C: 'Blink eyes' & 'squeeze hands' -> 0 = Performs both tasks 2: Horizontal extraocular movements -> 0 = Normal 3: Visual fields -> 0 = No visual loss 4: Facial palsy -> 0 = Normal symmetry 5A: Left arm motor drift -> 0 = No drift for 10 seconds 5B: Right arm motor drift -> 0 = No drift for 10 seconds 6A: Left leg motor drift -> 0 = No drift for 5 seconds 6B: Right leg motor drift -> 0 = No drift for 5 seconds 7: Limb Ataxia -> 0 = No ataxia 8: Sensation -> 0 = Normal; no sensory loss 9: Language/aphasia -> 1 = Mild-moderate aphasia: some obvious changes, without  significant limitation 10: Dysarthria -> 0 = Normal 11: Extinction/inattention -> 0 = No abnormality  Comments:  Door time: 843  [facility] contacted: [phone]  [facility] first log in: 904  NIHSS assessment time: 926   Blood glucose and blood pressure within acceptable parameters  CT head reviewed, and there is no indication of acute hemorrhage or mass effect. 1. Stable remote left parietal lobe infarct. 2. No acute cortical infarct. 3. Stable atrophy and white matter disease.  Medical Decision Making:   Patient is not candidate for Alteplase due to patient taking Eliquis.   Presentation is suggestive of large vessel disease. CTA head and neck and perfusion recommended.   No LVO.    1. Moderate to high-grade stenosis of the mid right M1 segment. This could certainly impact flow to the right MCA branch vessels. 2. Asymmetric attenuation of right MCA branch vessels compared to the left, the likely suggesting decreased flow. 3. Atherosclerotic changes in the left common carotid artery and at the left carotid bifurcation 50% stenosis relative to the more distal vessel. 4. Atherosclerotic changes in the left cavernous internal carotid artery with a 50% stenosis. 5. Fetal type right posterior cerebral artery. 6. Asymmetric attenuation of right PCA branches compared to the left.  Recommendations:  1. Daily antithrombotics to be started.  2. Please consult with Neurology to see for routine follow up.   Discussed with ED physician.  Please call with questions.   Dr. Collins [place]. Tommi Rumps  [facility]   Medical Decision Making:  - Extensive number of diagnosis or management options are considered above.  -  Extensive amount of complex data reviewed.  - High risk of complication and/or morbidity or mortality are associated with differential diagnostic considerations above.  - There may be uncertain outcome and increased probability of prolonged functional  impairment or high probability of severe prolonged functional impairment associated with some of these differential diagnosis.  Medical Data Reviewed:  1. Data reviewed include clinical labs, radiology, medical tests;  2. Tests results discussed w/performing or interpreting physician;  3. Obtaining/reviewing old medical records;  4. Obtaining case history from another source;  5. Independent review of images.

## 2017-01-27 NOTE — Plan of Care (Signed)
Pt admitted from the ED. VSS. NIH 4 upon admission. Speech slurred,but much improved during the shift. Tolerates clear liquid diet.

## 2017-01-27 NOTE — ED Notes (Signed)
Taken back to CT scan for CTA

## 2017-01-27 NOTE — ED Notes (Signed)
Pt given plain coffee.  Family at bedside. Informed family pt needs to be upright at all times. If any clearing throat, coughing, gagging, etc stop and let nursing staff know.

## 2017-01-27 NOTE — Clinical Social Work Note (Signed)
CSW received consult for SNF placement. CSW will follow pending PT recommendations.  Santiago Bumpers, MSW, Latanya Presser  [phone]

## 2017-01-27 NOTE — Progress Notes (Signed)
*  PRELIMINARY RESULTS* Echocardiogram 2D Echocardiogram has been performed.  Katelyn [name] Graylee Arutyunyan 01/27/2017, 12:51 PM

## 2017-01-27 NOTE — Progress Notes (Signed)
PT Cancellation Note  Patient Details Name: Katelyn [name] MRN: [id number] DOB: 07/20/1923   Cancelled Treatment:    Reason Eval/Treat Not Completed: Medical issues which prohibited therapy; Pt's BP currently 147/114 mmHg.  Spoke to nsg regarding concerns with working with pt with diastolic BP of 712 with nursing agreeing and requesting hold to PT eval until BP issues addressed.  Will attempt to see pt at a future date and time as medically appropriate.     Linus Salmons PT, DPT 01/27/17, 2:34 PM

## 2017-01-27 NOTE — Progress Notes (Signed)
Chaplain responded to a code stroke page for unresponsive pt. in Rm03. [place] arrived when medical team was evaluating pt. Pt's family at bedside. Pt was not responding to commands but after she returned from CT; pt. was more responsive to coherent and responsive to commands. Pt also appears to have a hearing problem. One has speak louder for her to hear, [place] noted. [place] escorted family to family consult Rm and provided them with coffee and crackers as needed. [place] also offered words of encouragement and prayer to family and pt. CH is available to follow up as needed.     01/27/17 1100  Clinical Encounter Type  Visited With Patient;Patient and family together;Health care provider  Visit Type Initial;Follow-up;Spiritual support;Code;Other (Comment)  Referral From Nurse  Consult/Referral To Chaplain  Spiritual Encounters  Spiritual Needs Prayer;Other (Comment)

## 2017-01-27 NOTE — H&P (Signed)
History and Physical    Niki Payment Sedivy XNA:[id number] DOB: 06-Sep-1923 DOA: 01/27/2017  Referring physician: Dr. Jacqualine Code PCP: Abelardo Diesel, NP (Inactive)  Specialists: none  Chief Complaint: unresponsiveness  HPI: Katelyn [name] is a 83 y.o. female has a past medical history significant for CVVA, HTN, PVD, and CKD now in ER after being found unresponsive by daughter at home. No family present and pt unaware of situation. Minimally verbal. BP elevated. CTA head and neck show carotid stenosis but no acute infarct. Pt on Eliquis all ready. Pt is improving in ER but still with some focal neurologic deficits. She is now admitted  Review of Systems: unable to obtain from pt due to altered level of consciousness  Past Medical History:  Diagnosis Date  . Abdominal pain   . Anxiety   . Atherosclerosis   . Chronic kidney disease    stage 3  . Colon polyps   . Constipation   . Cough with expectoration   . Dependent edema bilateral legs  . Gait abnormality   . Gout   . Hearing loss   . Hearing loss   . Hemorrhoids   . Hypertension   . Incontinence   . Osteoarthritis   . Osteoarthritis   . Osteoarthritis   . Paget's disease of bone   . Peripheral vascular disease ([place])   . Pneumonia   . Proteinuria   . Stroke [facility])    Past Surgical History:  Procedure Laterality Date  . Abdominal Aortogram w/Lower Extremity N/A 01/13/2015   Performed by Algernon Huxley, MD at [facility]  . Lower Extremity Intervention  01/13/2015   Performed by Algernon Huxley, MD at [facility]  . PARTIAL HYSTERECTOMY     Social History:  reports that  has never smoked. she has never used smokeless tobacco. She reports that she does not drink alcohol or use drugs.  Allergies  Allergen Reactions  . Bee Venom Anaphylaxis    Family History  Problem Relation Age of Onset  . Diabetes Mother   . Cancer Sister   . Cancer Sister   . Cancer Sister     Prior to Admission medications    Medication Sig Start Date End Date Taking? Authorizing Provider  acetaminophen (TYLENOL) 650 MG CR tablet Take 1,300 mg 2 (two) times daily as needed by mouth for pain.   Yes [provider]  cetirizine (ZYRTEC) 5 MG tablet Take 5 mg at bedtime by mouth.   Yes [provider]  donepezil (ARICEPT) 10 MG tablet Take 10 mg daily by mouth.   Yes [provider]  methimazole (TAPAZOLE) 10 MG tablet Take 5 mg 2 (two) times daily by mouth.   Yes [provider]  sennosides-docusate sodium (SENOKOT-S) 8.6-50 MG tablet Take 1 tablet 2 (two) times daily as needed by mouth for constipation.   Yes [provider]  amLODipine (NORVASC) 5 MG tablet Take 1 tablet (5 mg total) by mouth daily. 12/27/14   Nicholes Mango, MD  apixaban (ELIQUIS) 2.5 MG TABS tablet Take 1 tablet (2.5 mg total) by mouth 2 (two) times daily. 01/13/15   Algernon Huxley, MD  fluticasone (FLONASE) 50 MCG/ACT nasal spray Place 2 sprays into both nostrils daily. 09/03/14   Glean Hess, MD  hydrocortisone (ANUSOL-HC) 2.5 % rectal cream Place 1 application rectally 2 (two) times daily as needed for hemorrhoids.    [provider]  losartan (COZAAR) 50 MG tablet Take 50  mg by mouth daily.    [provider]  polyethylene glycol (MIRALAX / GLYCOLAX) packet Take 17 g by mouth daily.    [provider]  pravastatin (PRAVACHOL) 20 MG tablet Take 1 tablet (20 mg total) by mouth daily at 6 PM. Patient not taking: Reported on 01/27/2017 12/26/14   Nicholes Mango, MD   Physical Exam: Vitals:   01/27/17 0900 01/27/17 0915 01/27/17 0930 01/27/17 0945  BP: (!) 176/64     Pulse: 66 71 73 71  Resp: 19 20 16 14   SpO2: 100% 100% 100% 100%  Weight:         General:  No apparent distress, WDWN, [place]/AT  Eyes: PERRL, EOMI, no scleral icterus, conjunctiva clear  ENT: moist oropharynx without exudate, TM's benign, dentition fair  Neck: supple, no lymphadenopathy. Bilateral bruits  with mild thyromegaly  Cardiovascular: regular rate without MRG; 2+ peripheral pulses, no JVD, no peripheral edema  Respiratory: scattered rhonchi without wheezes or rales. No dullness. Respiratory effort normal  Abdomen: soft, non tender to palpation, positive bowel sounds, no guarding, no rebound  Skin: no rashes or lesions  Musculoskeletal: normal bulk and tone, no joint swelling  Psychiatric: slightly lethargic, oriented to person only  Neurologic: CN 2-12 grossly intact except for right gaze preference. Motor strength 5/5 in all 4 groups with symmetric DTR's and non-focal sensory exam  Labs on Admission:  Basic Metabolic Panel: Recent Labs  Lab 01/27/17 0847  NA 141  K 3.3*  CL 107  CO2 24  GLUCOSE 175*  BUN 16  CREATININE 1.10*  CALCIUM 8.9   Liver Function Tests: Recent Labs  Lab 01/27/17 0847  AST 21  ALT 9*  ALKPHOS 66  BILITOT 0.7  PROT 6.5  ALBUMIN 3.7   No results for input(s): LIPASE, AMYLASE in the last 168 hours. No results for input(s): AMMONIA in the last 168 hours. CBC: Recent Labs  Lab 01/27/17 0847  WBC 9.2  NEUTROABS 3.4  HGB 12.0  HCT 36.5  MCV 98.6  PLT 236   Cardiac Enzymes: Recent Labs  Lab 01/27/17 0847  TROPONINI <0.03    BNP (last 3 results) No results for input(s): BNP in the last 8760 hours.  ProBNP (last 3 results) No results for input(s): PROBNP in the last 8760 hours.  CBG: Recent Labs  Lab 01/27/17 0905  GLUCAP 132*    Radiological Exams on Admission: Ct Angio Head W Or Wo Contrast  Result Date: 01/27/2017 CLINICAL DATA:  Sudden onset altered mental status. Fixed right-sided gaze. EXAM: CT ANGIOGRAPHY HEAD AND NECK TECHNIQUE: Multidetector CT imaging of the head and neck was performed using the standard protocol during bolus administration of intravenous contrast. Multiplanar CT image reconstructions and MIPs were obtained to evaluate the vascular anatomy. Carotid stenosis measurements (when applicable)  are obtained utilizing NASCET criteria, using the distal internal carotid diameter as the denominator. CONTRAST:  78mL ISOVUE-370 IOPAMIDOL (ISOVUE-370) INJECTION 76% COMPARISON:  CT head without contrast from the same day. FINDINGS: CTA NECK FINDINGS Aortic arch: There is a common origin of the left common carotid artery in the innominate artery. Atherosclerotic calcifications are present distal to the great vessel origins. There is no significant stenosis at the origins of the great vessels. Right carotid system: The right common carotid artery is within normal limits. Bifurcation is unremarkable. There is moderate tortuosity of the scratched at there is mild tortuosity of the cervical right ICA without significant stenosis. Left carotid system: Mild atherosclerotic changes are present in  the mid left common carotid artery without significant stenosis. Calcified and noncalcified plaque is present at the distal left common carotid artery and bifurcation there is no significant stenosis of greater than 50% relative to the more distal vessel. There is mild tortuosity in the left cervical ICA without other stenosis. Vertebral arteries: The vertebral arteries originate from the subclavian arteries bilaterally without significant stenosis. The vertebral arteries are codominant. There is some tortuosity but no focal stenosis of either vertebral artery in the neck. Skeleton: Chronic endplate changes are noted in the cervical spine with osseous foraminal narrowing bilaterally at C3-4 and C6-7 most notably. No focal lytic or blastic lesions are present. Other neck: A large multinodular goiter is present. The gland is largest on the left with greatest measurements of 6.9 x 10.3 x 4.0 cm. No dominant lesion is present. There is no significant adenopathy. Salivary glands are within normal limits. Upper chest: Mild dependent atelectasis is present in both lungs. No focal nodule, mass, or airspace disease is present. Review of  the MIP images confirms the above findings CTA HEAD FINDINGS Anterior circulation: Atherosclerotic calcifications are present within the cavernous internal carotid artery is. The 50% stenosis is present in the right cavernous internal carotid artery. Calcifications are present in the left cavernous internal carotid artery without significant stenosis. The ICA termini are intact bilaterally. The right A1 segment is aplastic. Both A2 segments fill from the left with a patent anterior communicating artery. There is mild irregularity of the left M1 segment without a focal stenosis. A moderate to high-grade stenosis is present in the mid right M1 segment. MCA bifurcations are intact. Branch vessels fill on both sides. Caliber is more attenuated on the right, suggesting decreased profusion. Distal segmental narrowing is evident bilaterally without a significant proximal stenosis or occlusion otherwise. Posterior circulation: The vertebral arteries are codominant. PICA origins are visualized and normal. Basilar artery is small. The left posterior cerebral artery originates from the basilar tip with contribution from a left posterior communicating artery. The right posterior cerebral artery is of fetal type. There is asymmetric attenuation of distal right PCA branch vessels compared to the left. Venous sinuses: The dural sinuses are patent. The straight sinus and deep cerebral veins are intact. Cortical veins are unremarkable. Anatomic variants: The fetal type right posterior cerebral artery. Prominent left posterior communicating artery. Delayed phase: Not performed. Review of the MIP images confirms the above findings IMPRESSION: 1. Moderate to high-grade stenosis of the mid right M1 segment. This could certainly impact flow to the right MCA branch vessels. 2. Asymmetric attenuation of right MCA branch vessels compared to the left, the likely suggesting decreased flow. 3. Atherosclerotic changes in the left common carotid  artery and at the left carotid bifurcation 50% stenosis relative to the more distal vessel. 4. Atherosclerotic changes in the left cavernous internal carotid artery with a 50% stenosis. 5. Fetal type right posterior cerebral artery. 6. Asymmetric attenuation of right PCA branches compared to the left. 7. Multilevel spondylosis of the cervical spine. 8. Enlarged multinodular goiter of the thyroid. These results were called by telephone at the time of interpretation on 01/27/2017 at 9:38 am to Dr. Delman Kitten , who verbally acknowledged these results. Electronically Signed   By: San Morelle M.D.   On: 01/27/2017 09:51   Ct Angio Neck W And/or Wo Contrast  Result Date: 01/27/2017 CLINICAL DATA:  Sudden onset altered mental status. Fixed right-sided gaze. EXAM: CT ANGIOGRAPHY HEAD AND NECK TECHNIQUE: Multidetector CT imaging of  the head and neck was performed using the standard protocol during bolus administration of intravenous contrast. Multiplanar CT image reconstructions and MIPs were obtained to evaluate the vascular anatomy. Carotid stenosis measurements (when applicable) are obtained utilizing NASCET criteria, using the distal internal carotid diameter as the denominator. CONTRAST:  61mL ISOVUE-370 IOPAMIDOL (ISOVUE-370) INJECTION 76% COMPARISON:  CT head without contrast from the same day. FINDINGS: CTA NECK FINDINGS Aortic arch: There is a common origin of the left common carotid artery in the innominate artery. Atherosclerotic calcifications are present distal to the great vessel origins. There is no significant stenosis at the origins of the great vessels. Right carotid system: The right common carotid artery is within normal limits. Bifurcation is unremarkable. There is moderate tortuosity of the scratched at there is mild tortuosity of the cervical right ICA without significant stenosis. Left carotid system: Mild atherosclerotic changes are present in the mid left common carotid artery without  significant stenosis. Calcified and noncalcified plaque is present at the distal left common carotid artery and bifurcation there is no significant stenosis of greater than 50% relative to the more distal vessel. There is mild tortuosity in the left cervical ICA without other stenosis. Vertebral arteries: The vertebral arteries originate from the subclavian arteries bilaterally without significant stenosis. The vertebral arteries are codominant. There is some tortuosity but no focal stenosis of either vertebral artery in the neck. Skeleton: Chronic endplate changes are noted in the cervical spine with osseous foraminal narrowing bilaterally at C3-4 and C6-7 most notably. No focal lytic or blastic lesions are present. Other neck: A large multinodular goiter is present. The gland is largest on the left with greatest measurements of 6.9 x 10.3 x 4.0 cm. No dominant lesion is present. There is no significant adenopathy. Salivary glands are within normal limits. Upper chest: Mild dependent atelectasis is present in both lungs. No focal nodule, mass, or airspace disease is present. Review of the MIP images confirms the above findings CTA HEAD FINDINGS Anterior circulation: Atherosclerotic calcifications are present within the cavernous internal carotid artery is. The 50% stenosis is present in the right cavernous internal carotid artery. Calcifications are present in the left cavernous internal carotid artery without significant stenosis. The ICA termini are intact bilaterally. The right A1 segment is aplastic. Both A2 segments fill from the left with a patent anterior communicating artery. There is mild irregularity of the left M1 segment without a focal stenosis. A moderate to high-grade stenosis is present in the mid right M1 segment. MCA bifurcations are intact. Branch vessels fill on both sides. Caliber is more attenuated on the right, suggesting decreased profusion. Distal segmental narrowing is evident bilaterally  without a significant proximal stenosis or occlusion otherwise. Posterior circulation: The vertebral arteries are codominant. PICA origins are visualized and normal. Basilar artery is small. The left posterior cerebral artery originates from the basilar tip with contribution from a left posterior communicating artery. The right posterior cerebral artery is of fetal type. There is asymmetric attenuation of distal right PCA branch vessels compared to the left. Venous sinuses: The dural sinuses are patent. The straight sinus and deep cerebral veins are intact. Cortical veins are unremarkable. Anatomic variants: The fetal type right posterior cerebral artery. Prominent left posterior communicating artery. Delayed phase: Not performed. Review of the MIP images confirms the above findings IMPRESSION: 1. Moderate to high-grade stenosis of the mid right M1 segment. This could certainly impact flow to the right MCA branch vessels. 2. Asymmetric attenuation of right MCA branch vessels  compared to the left, the likely suggesting decreased flow. 3. Atherosclerotic changes in the left common carotid artery and at the left carotid bifurcation 50% stenosis relative to the more distal vessel. 4. Atherosclerotic changes in the left cavernous internal carotid artery with a 50% stenosis. 5. Fetal type right posterior cerebral artery. 6. Asymmetric attenuation of right PCA branches compared to the left. 7. Multilevel spondylosis of the cervical spine. 8. Enlarged multinodular goiter of the thyroid. These results were called by telephone at the time of interpretation on 01/27/2017 at 9:38 am to Dr. Delman Kitten , who verbally acknowledged these results. Electronically Signed   By: San Morelle M.D.   On: 01/27/2017 09:51   Dg Chest Portable 1 View  Result Date: 01/27/2017 CLINICAL DATA:  Altered mental status.  Found unresponsive. EXAM: PORTABLE CHEST 1 VIEW COMPARISON:  Acute abdominal series 05/06/2016. FINDINGS: The heart  is enlarged. Aortic atherosclerosis is present. There is no edema or effusions. Mild bibasilar atelectasis present. A large thyroid goiter is again noted. IMPRESSION: 1. Cardiomegaly without failure. 2. Mild bibasilar atelectasis. 3. Prominent thyroid goiter. Electronically Signed   By: San Morelle M.D.   On: 01/27/2017 10:03   Ct Head Code Stroke Wo Contrast  Result Date: 01/27/2017 CLINICAL DATA:  Code stroke. Fixed right-sided gaze. Lethargy. Altered mental status. EXAM: CT HEAD WITHOUT CONTRAST TECHNIQUE: Contiguous axial images were obtained from the base of the skull through the vertex without intravenous contrast. COMPARISON:  CT head without contrast 02/14/2015 FINDINGS: Brain: The a remote left parietal lobe infarct is stable. No acute cortical infarct is present. Moderate atrophy and diffuse white matter changes are otherwise stable. Ventricles are proportionate to the degree of atrophy. No significant extra-axial fluid collection is present. The brainstem and cerebellum are normal. Vascular: Atherosclerotic calcifications are present within the cavernous internal carotid artery is bilaterally. There is no hyperdense vessel. Skull: Calvarium is intact. No focal lytic or blastic lesions are present. Sinuses/Orbits: The paranasal sinuses and mastoid air cells are clear. Globes and orbits are within normal limits bilaterally. ASPECTS [facility] Stroke Program Early CT Score) - Ganglionic level infarction (caudate, lentiform nuclei, internal capsule, insula, M1-M3 cortex): 7/7 - Supraganglionic infarction (M4-M6 cortex): 3/3 Total score (0-10 with 10 being normal): 10/10 IMPRESSION: 1. Stable remote left parietal lobe infarct. 2. No acute cortical infarct. 3. Stable atrophy and white matter disease. 4. ASPECTS is 10/10 These results were called by telephone at the time of interpretation on 01/27/2017 at 9:15 am to Dr. Delman Kitten , who verbally acknowledged these results. Electronically Signed   By:  San Morelle M.D.   On: 01/27/2017 09:18    EKG: Independently reviewed.  Assessment/Plan Principal Problem:   CVA (cerebral vascular accident) [facility]) Active Problems:   Essential (primary) hypertension   Hyperglycemia   PAD (peripheral artery disease) ([place])   Will admit to floor for CVA as DNR. Begin ASA and continue Eliquis. Consult Neurology, PT, ST, and CSW. Neuro checks q4h. Follow sugars. Begin IV fluids.  Diet: NPO until cleared by ST Fluids: NS with K+@75  DVT Prophylaxis: Eliquis  Code Status: DNR per ER  Family Communication: none  Disposition Plan: SNF  Time spent: 50 min

## 2017-01-27 NOTE — Progress Notes (Signed)
Chaplain responded to a consult for Advanced Directive. CH met put and family at bedside. Pt asleep, CH talked to family and explained the purpose of the visit as to educate pt. about Advanced Directive. Daughter went over the material and asked Wisner to come back later. CH made a follow up visit at 3:28pm but pt. was still asleep. CH is available to follow up with pt. as needed.   01/27/17 1500  Clinical Encounter Type  Visited With Patient;Patient and family together  Visit Type Follow-up;Other (Comment)  Referral From Nurse  Consult/Referral To Chaplain  Spiritual Encounters  Spiritual Needs Literature;Other (Comment)

## 2017-01-27 NOTE — ED Notes (Signed)
Pt to ct with rn and monitor and RT

## 2017-01-27 NOTE — ED Notes (Signed)
Patient transported to 1C by this RN on cardiac monitor. Greeted by two nurse techs who assisted patient transport from ED stretcher to inpatient hospital bed. 1C RN at bedside prior to this RN returning to ED. Patient tolerated transport well. Family at bedside.

## 2017-01-27 NOTE — Consult Note (Signed)
Reason for Consult: MCA CVA with carotid stenosis Referring Physician: Dr. Rulon Abide Katelyn [name] is an 83 y.o. female.  HPI: Patient with history of HTN, CVA, CKD 3 presented after being found down at home. Found to have [name] MCA stroke. MCA stenosis, <50% left ICA stenosis. Since being in the ER has improved significantly with motor and speech. Was on Eliquis previously.  Past Medical History:  Diagnosis Date  . Abdominal pain   . Anxiety   . Atherosclerosis   . Chronic kidney disease    stage 3  . Colon polyps   . Constipation   . Cough with expectoration   . Dependent edema bilateral legs  . Gait abnormality   . Gout   . Hearing loss   . Hearing loss   . Hemorrhoids   . Hypertension   . Incontinence   . Osteoarthritis   . Osteoarthritis   . Osteoarthritis   . Paget's disease of bone   . Peripheral vascular disease ([place])   . Pneumonia   . Proteinuria   . Stroke Katelyn [name])     Past Surgical History:  Procedure Laterality Date  . Abdominal Aortogram w/Lower Extremity N/[name] 01/13/2015   Performed by Katelyn Huxley, MD at [place] [facility]  . Lower Extremity Intervention  01/13/2015   Performed by Katelyn Huxley, MD at [facility]  . PARTIAL HYSTERECTOMY      Family History  Problem Relation Age of Onset  . Diabetes Mother   . Cancer Sister   . Cancer Sister   . Cancer Sister     Social History:  reports that  has never smoked. she has never used smokeless tobacco. She reports that she does not drink alcohol or use drugs.  Allergies:  Allergies  Allergen Reactions  . Bee Venom Anaphylaxis    Medications: I have reviewed the patient's current medications.  Results for orders placed or performed during the [name] encounter of 01/27/17 (from the past 48 hour(s))  Protime-INR     Status: None   Collection Time: 01/27/17  8:47 AM  Result Value Ref Range   Prothrombin Time 14.5 11.4 - 15.2 seconds   INR 1.14   APTT     Status: None   Collection Time:  01/27/17  8:47 AM  Result Value Ref Range   aPTT 32 24 - 36 seconds  CBC     Status: Abnormal   Collection Time: 01/27/17  8:47 AM  Result Value Ref Range   WBC 9.2 3.6 - 11.0 K/uL   RBC 3.70 (L) 3.80 - 5.20 MIL/uL   Hemoglobin 12.0 12.0 - 16.0 g/dL   HCT 36.5 35.0 - 47.0 %   MCV 98.6 80.0 - 100.0 fL   MCH 32.5 26.0 - 34.0 pg   MCHC 32.9 32.0 - 36.0 g/dL   RDW 13.9 11.5 - 14.5 %   Platelets 236 150 - 440 K/uL  Differential     Status: Abnormal   Collection Time: 01/27/17  8:47 AM  Result Value Ref Range   Neutrophils Relative % 36 %   Neutro Abs 3.4 1.4 - 6.5 K/uL   Lymphocytes Relative 56 %   Lymphs Abs 5.2 (H) 1.0 - 3.6 K/uL   Monocytes Relative 6 %   Monocytes Absolute 0.5 0.2 - 0.9 K/uL   Eosinophils Relative 1 %   Eosinophils Absolute 0.1 0 - 0.7 K/uL   Basophils Relative 1 %   Basophils Absolute 0.0 0 -  0.1 K/uL  Comprehensive metabolic panel     Status: Abnormal   Collection Time: 01/27/17  8:47 AM  Result Value Ref Range   Sodium 141 135 - 145 mmol/L   Potassium 3.3 (L) 3.5 - 5.1 mmol/L   Chloride 107 101 - 111 mmol/L   CO2 24 22 - 32 mmol/L   Glucose, Bld 175 (H) 65 - 99 mg/dL   BUN 16 6 - 20 mg/dL   Creatinine, Ser 1.10 (H) 0.44 - 1.00 mg/dL   Calcium 8.9 8.9 - 10.3 mg/dL   Total Protein 6.5 6.5 - 8.1 g/dL   Albumin 3.7 3.5 - 5.0 g/dL   AST 21 15 - 41 U/L   ALT 9 (L) 14 - 54 U/L   Alkaline Phosphatase 66 38 - 126 U/L   Total Bilirubin 0.7 0.3 - 1.2 mg/dL   GFR calc non Af Amer 43 (L) >60 mL/min   GFR calc Af Amer 49 (L) >60 mL/min    Comment: (NOTE) The eGFR has been calculated using the CKD EPI equation. This calculation has not been validated in all clinical situations. eGFR's persistently <60 mL/min signify possible Chronic Kidney Disease.    Anion gap 10 5 - 15  Troponin I     Status: None   Collection Time: 01/27/17  8:47 AM  Result Value Ref Range   Troponin I <0.03 <0.03 ng/mL  Blood gas, venous     Status: Abnormal (Preliminary result)    Collection Time: 01/27/17  8:51 AM  Result Value Ref Range   pH, Ven 7.27 7.250 - 7.430   pCO2, Ven 63 (H) 44.0 - 60.0 mmHg   pO2, Ven PENDING 32.0 - 45.0 mmHg   Bicarbonate 28.9 (H) 20.0 - 28.0 mmol/L   Acid-Base Excess 0.7 0.0 - 2.0 mmol/L   Patient temperature 37.0    Collection site VEIN    Sample type VENIPUNCTURE   Glucose, capillary     Status: Abnormal   Collection Time: 01/27/17  9:05 AM  Result Value Ref Range   Glucose-Capillary 132 (H) 65 - 99 mg/dL    Ct Angio Head W Or Wo Contrast  Result Date: 01/27/2017 CLINICAL DATA:  Sudden onset altered mental status. Fixed right-sided gaze. EXAM: CT ANGIOGRAPHY HEAD AND NECK TECHNIQUE: Multidetector CT imaging of the head and neck was performed using the standard protocol during bolus administration of intravenous contrast. Multiplanar CT image reconstructions and MIPs were obtained to evaluate the vascular anatomy. Carotid stenosis measurements (when applicable) are obtained utilizing NASCET criteria, using the distal internal carotid diameter as the denominator. CONTRAST:  81m ISOVUE-370 IOPAMIDOL (ISOVUE-370) INJECTION 76% COMPARISON:  CT head without contrast from the same day. FINDINGS: CTA NECK FINDINGS Aortic arch: There is [name] in the innominate artery. Atherosclerotic calcifications are present distal to the great vessel origins. There is no significant stenosis at the origins of the great vessels. Right carotid system: The right common carotid artery is within normal limits. Bifurcation is unremarkable. There is moderate tortuosity of the scratched at there is mild tortuosity of the cervical right ICA without significant stenosis. Left carotid system: Mild atherosclerotic changes are present in the mid left common carotid artery without significant stenosis. Calcified and noncalcified plaque is present at the distal left common carotid artery and bifurcation there is no significant  stenosis of greater than 50% relative to the more distal vessel. There is mild tortuosity in the left cervical ICA without other stenosis.  Vertebral arteries: The vertebral arteries originate from the subclavian arteries bilaterally without significant stenosis. The vertebral arteries are codominant. There is some tortuosity but no focal stenosis of either vertebral artery in the neck. Skeleton: Chronic endplate changes are noted in the cervical spine with osseous foraminal narrowing bilaterally at C3-4 and C6-7 most notably. No focal lytic or blastic lesions are present. Other neck: [name] large multinodular goiter is present. The gland is largest on the left with greatest measurements of 6.9 x 10.3 x 4.0 cm. No dominant lesion is present. There is no significant adenopathy. Salivary glands are within normal limits. Upper chest: Mild dependent atelectasis is present in both lungs. No focal nodule, mass, or airspace disease is present. Review of the MIP images confirms the above findings CTA HEAD FINDINGS Anterior circulation: Atherosclerotic calcifications are present within the cavernous internal carotid artery is. The 50% stenosis is present in the right cavernous internal carotid artery. Calcifications are present in the left cavernous internal carotid artery without significant stenosis. The ICA termini are intact bilaterally. The right A1 segment is aplastic. Both A2 segments fill from the left with [name] patent anterior communicating artery. There is mild irregularity of the left M1 segment without [name] focal stenosis. [name] moderate to high-grade stenosis is present in the mid right M1 segment. MCA bifurcations are intact. Branch vessels fill on both sides. Caliber is more attenuated on the right, suggesting decreased profusion. Distal segmental narrowing is evident bilaterally without [name] significant proximal stenosis or occlusion otherwise. Posterior circulation: The vertebral arteries are codominant. PICA origins are  visualized and normal. Basilar artery is small. The left posterior cerebral artery originates from the basilar tip with contribution from [name] left posterior communicating artery. The right posterior cerebral artery is of fetal type. There is asymmetric attenuation of distal right PCA branch vessels compared to the left. Venous sinuses: The dural sinuses are patent. The straight sinus and deep cerebral veins are intact. Cortical veins are unremarkable. Anatomic variants: The fetal type right posterior cerebral artery. Prominent left posterior communicating artery. Delayed phase: Not performed. Review of the MIP images confirms the above findings IMPRESSION: 1. Moderate to high-grade stenosis of the mid right M1 segment. This could certainly impact flow to the right MCA branch vessels. 2. Asymmetric attenuation of right MCA branch vessels compared to the left, the likely suggesting decreased flow. 3. Atherosclerotic changes in the left common carotid artery and at the left carotid bifurcation 50% stenosis relative to the more distal vessel. 4. Atherosclerotic changes in the left cavernous internal carotid artery with [name] 50% stenosis. 5. Fetal type right posterior cerebral artery. 6. Asymmetric attenuation of right PCA branches compared to the left. 7. Multilevel spondylosis of the cervical spine. 8. Enlarged multinodular goiter of the thyroid. These results were called by telephone at the time of interpretation on 01/27/2017 at 9:38 am to Dr. Delman Kitten , who verbally acknowledged these results. Electronically Signed   By: San Morelle M.D.   On: 01/27/2017 09:51   Ct Angio Neck W And/or Wo Contrast  Result Date: 01/27/2017 CLINICAL DATA:  Sudden onset altered mental status. Fixed right-sided gaze. EXAM: CT ANGIOGRAPHY HEAD AND NECK TECHNIQUE: Multidetector CT imaging of the head and neck was performed using the standard protocol during bolus administration of intravenous contrast. Multiplanar CT image  reconstructions and MIPs were obtained to evaluate the vascular anatomy. Carotid stenosis measurements (when applicable) are obtained utilizing NASCET criteria, using the distal internal carotid diameter as the denominator. CONTRAST:  42m  ISOVUE-370 IOPAMIDOL (ISOVUE-370) INJECTION 76% COMPARISON:  CT head without contrast from the same day. FINDINGS: CTA NECK FINDINGS Aortic arch: There is [name] in the innominate artery. Atherosclerotic calcifications are present distal to the great vessel origins. There is no significant stenosis at the origins of the great vessels. Right carotid system: The right common carotid artery is within normal limits. Bifurcation is unremarkable. There is moderate tortuosity of the scratched at there is mild tortuosity of the cervical right ICA without significant stenosis. Left carotid system: Mild atherosclerotic changes are present in the mid left common carotid artery without significant stenosis. Calcified and noncalcified plaque is present at the distal left common carotid artery and bifurcation there is no significant stenosis of greater than 50% relative to the more distal vessel. There is mild tortuosity in the left cervical ICA without other stenosis. Vertebral arteries: The vertebral arteries originate from the subclavian arteries bilaterally without significant stenosis. The vertebral arteries are codominant. There is some tortuosity but no focal stenosis of either vertebral artery in the neck. Skeleton: Chronic endplate changes are noted in the cervical spine with osseous foraminal narrowing bilaterally at C3-4 and C6-7 most notably. No focal lytic or blastic lesions are present. Other neck: [name] large multinodular goiter is present. The gland is largest on the left with greatest measurements of 6.9 x 10.3 x 4.0 cm. No dominant lesion is present. There is no significant adenopathy. Salivary glands are within normal limits. Upper  chest: Mild dependent atelectasis is present in both lungs. No focal nodule, mass, or airspace disease is present. Review of the MIP images confirms the above findings CTA HEAD FINDINGS Anterior circulation: Atherosclerotic calcifications are present within the cavernous internal carotid artery is. The 50% stenosis is present in the right cavernous internal carotid artery. Calcifications are present in the left cavernous internal carotid artery without significant stenosis. The ICA termini are intact bilaterally. The right A1 segment is aplastic. Both A2 segments fill from the left with [name] patent anterior communicating artery. There is mild irregularity of the left M1 segment without [name] focal stenosis. [name] moderate to high-grade stenosis is present in the mid right M1 segment. MCA bifurcations are intact. Branch vessels fill on both sides. Caliber is more attenuated on the right, suggesting decreased profusion. Distal segmental narrowing is evident bilaterally without [name] significant proximal stenosis or occlusion otherwise. Posterior circulation: The vertebral arteries are codominant. PICA origins are visualized and normal. Basilar artery is small. The left posterior cerebral artery originates from the basilar tip with contribution from [name] left posterior communicating artery. The right posterior cerebral artery is of fetal type. There is asymmetric attenuation of distal right PCA branch vessels compared to the left. Venous sinuses: The dural sinuses are patent. The straight sinus and deep cerebral veins are intact. Cortical veins are unremarkable. Anatomic variants: The fetal type right posterior cerebral artery. Prominent left posterior communicating artery. Delayed phase: Not performed. Review of the MIP images confirms the above findings IMPRESSION: 1. Moderate to high-grade stenosis of the mid right M1 segment. This could certainly impact flow to the right MCA branch vessels. 2. Asymmetric attenuation of right MCA  branch vessels compared to the left, the likely suggesting decreased flow. 3. Atherosclerotic changes in the left common carotid artery and at the left carotid bifurcation 50% stenosis relative to the more distal vessel. 4. Atherosclerotic changes in the left cavernous internal carotid artery with [name] 50% stenosis. 5. Fetal type right posterior  cerebral artery. 6. Asymmetric attenuation of right PCA branches compared to the left. 7. Multilevel spondylosis of the cervical spine. 8. Enlarged multinodular goiter of the thyroid. These results were called by telephone at the time of interpretation on 01/27/2017 at 9:38 am to Dr. Delman Kitten , who verbally acknowledged these results. Electronically Signed   By: San Morelle M.D.   On: 01/27/2017 09:51   Dg Chest Portable 1 View  Result Date: 01/27/2017 CLINICAL DATA:  Altered mental status.  Found unresponsive. EXAM: PORTABLE CHEST 1 VIEW COMPARISON:  Acute abdominal series 05/06/2016. FINDINGS: The heart is enlarged. Aortic atherosclerosis is present. There is no edema or effusions. Mild bibasilar atelectasis present. [name] large thyroid goiter is again noted. IMPRESSION: 1. Cardiomegaly without failure. 2. Mild bibasilar atelectasis. 3. Prominent thyroid goiter. Electronically Signed   By: San Morelle M.D.   On: 01/27/2017 10:03   Ct Head Code Stroke Wo Contrast  Result Date: 01/27/2017 CLINICAL DATA:  Code stroke. Fixed right-sided gaze. Lethargy. Altered mental status. EXAM: CT HEAD WITHOUT CONTRAST TECHNIQUE: Contiguous axial images were obtained from the base of the skull through the vertex without intravenous contrast. COMPARISON:  CT head without contrast 02/14/2015 FINDINGS: Brain: The [name] remote left parietal lobe infarct is stable. No acute cortical infarct is present. Moderate atrophy and diffuse white matter changes are otherwise stable. Ventricles are proportionate to the degree of atrophy. No significant extra-axial fluid collection is  present. The brainstem and cerebellum are normal. Vascular: Atherosclerotic calcifications are present within the cavernous internal carotid artery is bilaterally. There is no hyperdense vessel. Skull: Calvarium is intact. No focal lytic or blastic lesions are present. Sinuses/Orbits: The paranasal sinuses and mastoid air cells are clear. Globes and orbits are within normal limits bilaterally. ASPECTS [facility] Stroke Program Early CT Score) - Ganglionic level infarction (caudate, lentiform nuclei, internal capsule, insula, M1-M3 cortex): 7/7 - Supraganglionic infarction (M4-M6 cortex): 3/3 Total score (0-10 with 10 being normal): 10/10 IMPRESSION: 1. Stable remote left parietal lobe infarct. 2. No acute cortical infarct. 3. Stable atrophy and white matter disease. 4. ASPECTS is 10/10 These results were called by telephone at the time of interpretation on 01/27/2017 at 9:15 am to Dr. Delman Kitten , who verbally acknowledged these results. Electronically Signed   By: San Morelle M.D.   On: 01/27/2017 09:18    Review of Systems  Constitutional: Negative for fever and malaise/fatigue.  Respiratory: Negative.   Cardiovascular: Negative for chest pain.  Neurological: Positive for speech change.   Blood pressure (!) 176/64, pulse 71, resp. rate 14, weight 74.8 kg (165 lb), SpO2 100 %. Physical Exam  Nursing note and vitals reviewed. Constitutional: She appears well-developed and well-nourished.  HENT:  Head: Normocephalic.  Eyes: Pupils are equal, round, and reactive to light.  Neck: Normal range of motion. Neck supple.  Cardiovascular: Normal rate, regular rhythm and intact distal pulses.  GI: Soft.  Musculoskeletal: Normal range of motion.  Neurological: She is alert. No cranial nerve deficit.  Alert Speech difficult but appears to be oriented with family at bedside. 5/5 strength throughout  Skin: Skin is warm.    Assessment/Plan:  CTA reviewed. 50% left ICA stenosis  Appears to be  high grade  MCA stenosis  No Vascular Surgery intervention recommended at this time. Continue Eliquis and BP control.  Esco, Katelyn [name] 01/27/2017, 10:56 AM

## 2017-01-27 NOTE — ED Triage Notes (Addendum)
Pt woke up, lives at home. Daughter was fixing something to eat and then EMS called for cardiac arrest. Pt did not arrest but was unresponsive with right deviated gaze to right per EMS.

## 2017-01-27 NOTE — ED Notes (Signed)
Pt remains in CT scan with RN and tech

## 2017-01-27 NOTE — ED Notes (Signed)
Pt now speaking some words. Speech remains difficult to comprehend

## 2017-01-27 NOTE — ED Notes (Signed)
Dr sparks aware of swallow screen and ok for some coffee

## 2017-01-28 ENCOUNTER — Inpatient Hospital Stay: Payer: No Typology Code available for payment source

## 2017-01-28 DIAGNOSIS — G459 Transient cerebral ischemic attack, unspecified: Principal | ICD-10-CM

## 2017-01-28 DIAGNOSIS — R4182 Altered mental status, unspecified: Secondary | ICD-10-CM

## 2017-01-28 LAB — CBC
HCT: 33.6 % — ABNORMAL LOW (ref 35.0–47.0)
Hemoglobin: 11.2 g/dL — ABNORMAL LOW (ref 12.0–16.0)
MCH: 32.8 pg (ref 26.0–34.0)
MCHC: 33.4 g/dL (ref 32.0–36.0)
MCV: 98.1 fL (ref 80.0–100.0)
PLATELETS: 180 10*3/uL (ref 150–440)
RBC: 3.42 MIL/uL — AB (ref 3.80–5.20)
RDW: 13.6 % (ref 11.5–14.5)
WBC: 5.8 10*3/uL (ref 3.6–11.0)

## 2017-01-28 LAB — COMPREHENSIVE METABOLIC PANEL
ALT: 8 U/L — ABNORMAL LOW (ref 14–54)
ANION GAP: 8 (ref 5–15)
AST: 16 U/L (ref 15–41)
Albumin: 3.2 g/dL — ABNORMAL LOW (ref 3.5–5.0)
Alkaline Phosphatase: 53 U/L (ref 38–126)
BUN: 16 mg/dL (ref 6–20)
CHLORIDE: 108 mmol/L (ref 101–111)
CO2: 23 mmol/L (ref 22–32)
Calcium: 8.5 mg/dL — ABNORMAL LOW (ref 8.9–10.3)
Creatinine, Ser: 1 mg/dL (ref 0.44–1.00)
GFR calc non Af Amer: 48 mL/min — ABNORMAL LOW (ref >60)
GFR, EST AFRICAN AMERICAN: 55 mL/min — AB (ref >60)
GLUCOSE: 87 mg/dL (ref 65–99)
Potassium: 3.7 mmol/L (ref 3.5–5.1)
SODIUM: 139 mmol/L (ref 135–145)
Total Bilirubin: 0.5 mg/dL (ref 0.3–1.2)
Total Protein: 5.8 g/dL — ABNORMAL LOW (ref 6.5–8.1)

## 2017-01-28 LAB — ECHOCARDIOGRAM COMPLETE: WEIGHTICAEL: 2640 [oz_av]

## 2017-01-28 LAB — GLUCOSE, CAPILLARY
GLUCOSE-CAPILLARY: 79 mg/dL (ref 65–99)
GLUCOSE-CAPILLARY: 106 mg/dL — AB (ref 65–99)
Glucose-Capillary: 84 mg/dL (ref 65–99)
Glucose-Capillary: 80 mg/dL (ref 65–99)
Glucose-Capillary: 115 mg/dL — ABNORMAL HIGH (ref 65–99)

## 2017-01-28 MED ORDER — ATORVASTATIN CALCIUM 20 MG PO TABS
40.0000 mg | ORAL_TABLET | Freq: Every day | ORAL | Status: DC
  Administered 2017-01-28: 40 mg via ORAL
  Filled 2017-01-28: qty 2

## 2017-01-28 MED ORDER — PANTOPRAZOLE SODIUM 40 MG PO TBEC: 40.0000 mg | DELAYED_RELEASE_TABLET | Freq: Two times a day (BID) | ORAL | Status: DC

## 2017-01-28 NOTE — Progress Notes (Signed)
Medications administered by student RN [id number] with supervision of Clinical Instructor Arelia Volpe MSN, RN-BC or patient's assigned RN.   

## 2017-01-28 NOTE — Consult Note (Signed)
Referring Physician: Tressia Miners    Chief Complaint: Andre Lefort  HPI: Katelyn [name] is an 83 y.o. female presenting after being found unresponsive at home.  Patient is amnestic of the event.  All history obtained from daughter that was not present during the event.  Patient was with her daughter who left her in the bathroom.  When she returned from the kitchen she found the patient on the toilet but unresponsive.  She had not fallen to the floor.  EMS was called.  Patient eventually improved to the point that she had slurred speech.  On presentation has right gaze preference as well.  Initial NIHSS of 4.  Daughter reports she is back to baseline.   Patient on Eliquis at home  Date last known well: Date: 01/27/2017 Time last known well: Time: 08:20 tPA Given: No: Patient on Eliquis  Past Medical History:  Diagnosis Date  . Abdominal pain   . Anxiety   . Atherosclerosis   . Chronic kidney disease    stage 3  . Colon polyps   . Constipation   . Cough with expectoration   . Dependent edema bilateral legs  . Gait abnormality   . Gout   . Hearing loss   . Hearing loss   . Hemorrhoids   . Hypertension   . Incontinence   . Osteoarthritis   . Osteoarthritis   . Osteoarthritis   . Paget's disease of bone   . Peripheral vascular disease ([place])   . Pneumonia   . Proteinuria   . Stroke [facility])     Past Surgical History:  Procedure Laterality Date  . Abdominal Aortogram w/Lower Extremity N/A 01/13/2015   Performed by Algernon Huxley, MD at [facility] [facility]  . Lower Extremity Intervention  01/13/2015   Performed by Algernon Huxley, MD at [place] [facility]  . PARTIAL HYSTERECTOMY      Family History  Problem Relation Age of Onset  . Diabetes Mother   . Cancer Sister   . Cancer Sister   . Cancer Sister    Social History:  reports that  has never smoked. she has never used smokeless tobacco. She reports that she does not drink alcohol or use drugs.  Allergies:  Allergies   Allergen Reactions  . Bee Venom Anaphylaxis    Medications:  I have reviewed the patient's current medications. Prior to Admission:  Medications Prior to Admission  Medication Sig Dispense Refill Last Dose  . acetaminophen (TYLENOL) 650 MG CR tablet Take 1,300 mg 2 (two) times daily as needed by mouth for pain.   N/A at PM  . cetirizine (ZYRTEC) 5 MG tablet Take 5 mg at bedtime by mouth.   N/A at PM  . donepezil (ARICEPT) 10 MG tablet Take 10 mg daily by mouth.   N/A at AM  . methimazole (TAPAZOLE) 10 MG tablet Take 5 mg 2 (two) times daily by mouth.   N/A at PM  . sennosides-docusate sodium (SENOKOT-S) 8.6-50 MG tablet Take 1 tablet 2 (two) times daily as needed by mouth for constipation.   N/A at PM  . amLODipine (NORVASC) 5 MG tablet Take 1 tablet (5 mg total) by mouth daily. 30 tablet 0 N/A at AM  . apixaban (ELIQUIS) 2.5 MG TABS tablet Take 1 tablet (2.5 mg total) by mouth 2 (two) times daily. 60 tablet 3 N/A at AM  . fluticasone (FLONASE) 50 MCG/ACT nasal spray Place 2 sprays into both nostrils daily. 16 g 5 PRN  at PRN  . hydrocortisone (ANUSOL-HC) 2.5 % rectal cream Place 1 application rectally 2 (two) times daily as needed for hemorrhoids.   PRN at PRN  . losartan (COZAAR) 50 MG tablet Take 50 mg by mouth daily.   N/A at AM  . polyethylene glycol (MIRALAX / GLYCOLAX) packet Take 17 g by mouth daily.   PRN at PRN  . pravastatin (PRAVACHOL) 20 MG tablet Take 1 tablet (20 mg total) by mouth daily at 6 PM. (Patient not taking: Reported on 01/27/2017) 30 tablet 0 Not Taking at Unknown time   Scheduled: . amLODipine  5 mg Oral Daily  . apixaban  2.5 mg Oral BID  . aspirin EC  81 mg Oral Daily  . docusate sodium  100 mg Oral BID  . donepezil  10 mg Oral Daily  . fluticasone  2 spray Each Nare Daily  . insulin aspart  0-9 Units Subcutaneous Q4H  . losartan  50 mg Oral Daily  . mouth rinse  15 mL Mouth Rinse BID  . methimazole  5 mg Oral BID  . pantoprazole (PROTONIX) IV  40 mg  Intravenous Q12H    ROS: History obtained from the patient  General ROS: negative for - chills, fatigue, fever, night sweats, weight gain or weight loss Psychological ROS: negative for - behavioral disorder, hallucinations, memory difficulties, mood swings or suicidal ideation Ophthalmic ROS: negative for - blurry vision, double vision, eye pain or loss of vision ENT ROS: negative for - epistaxis, nasal discharge, oral lesions, sore throat, tinnitus or vertigo Allergy and Immunology ROS: negative for - hives or itchy/watery eyes Hematological and Lymphatic ROS: negative for - bleeding problems, bruising or swollen lymph nodes Endocrine ROS: negative for - galactorrhea, hair pattern changes, polydipsia/polyuria or temperature intolerance Respiratory ROS: negative for - cough, hemoptysis, shortness of breath or wheezing Cardiovascular ROS: negative for - chest pain, dyspnea on exertion, edema or irregular heartbeat Gastrointestinal ROS: negative for - abdominal pain, diarrhea, hematemesis, nausea/vomiting or stool incontinence Genito-Urinary ROS: negative for - dysuria, hematuria, incontinence or urinary frequency/urgency Musculoskeletal ROS: negative for - joint swelling or muscular weakness Neurological ROS: as noted in HPI Dermatological ROS: negative for rash and skin lesion changes  Physical Examination: Blood pressure (!) 146/66, pulse 87, temperature 97.8 F (36.6 C), temperature source Oral, resp. rate 17, height 5\' 4"  (1.626 m), weight 76.8 kg (169 lb 6.4 oz), SpO2 98 %.  HEENT-  Normocephalic, no lesions, without obvious abnormality.  Normal external eye and conjunctiva.  Normal TM's bilaterally.  Normal auditory canals and external ears. Normal external nose, mucus membranes and septum.  Normal pharynx. Cardiovascular- S1, S2 normal, pulses palpable throughout   Lungs- chest clear, no wheezing, rales, normal symmetric air entry Abdomen- soft, non-tender; bowel sounds normal; no  masses,  no organomegaly Extremities- no edema Lymph-no adenopathy palpable Musculoskeletal-no joint tenderness, deformity or swelling Skin-warm and dry, no hyperpigmentation, vitiligo, or suspicious lesions  Neurological Examination   Mental Status: Alert.  Speech fluent without evidence of aphasia.  Able to follow 3 step commands but requires some reinforcement. Cranial Nerves: II: Discs flat bilaterally; Visual fields grossly normal, pupils equal, round, reactive to light and accommodation III,IV, VI: ptosis not present, extra-ocular motions intact bilaterally V,VII: smile symmetric, facial light touch sensation normal bilaterally VIII: hearing normal bilaterally IX,X: gag reflex present XI: bilateral shoulder shrug XII: tongue deviation to the right Motor: Able to lift all extremities against gravity with no focal weakness noted.   Sensory: Pinprick and  light touch intact throughout, bilaterally Deep Tendon Reflexes: 2+ in the upper extremities, absent in the lower extremities Plantars: Right: downgoing   Left: downgoing Cerebellar: Normal finger-to-nose and normal heel-to-shin testing bilaterally Gait: not tested due to safety concerns    Laboratory Studies:  Basic Metabolic Panel: Recent Labs  Lab 01/27/17 0847 01/28/17 0426  NA 141 139  K 3.3* 3.7  CL 107 108  CO2 24 23  GLUCOSE 175* 87  BUN 16 16  CREATININE 1.10* 1.00  CALCIUM 8.9 8.5*    Liver Function Tests: Recent Labs  Lab 01/27/17 0847 01/28/17 0426  AST 21 16  ALT 9* 8*  ALKPHOS 66 53  BILITOT 0.7 0.5  PROT 6.5 5.8*  ALBUMIN 3.7 3.2*   No results for input(s): LIPASE, AMYLASE in the last 168 hours. No results for input(s): AMMONIA in the last 168 hours.  CBC: Recent Labs  Lab 01/27/17 0847 01/28/17 0426  WBC 9.2 5.8  NEUTROABS 3.4  --   HGB 12.0 11.2*  HCT 36.5 33.6*  MCV 98.6 98.1  PLT 236 180    Cardiac Enzymes: Recent Labs  Lab 01/27/17 0847  TROPONINI <0.03     BNP: Invalid input(s): POCBNP  CBG: Recent Labs  Lab 01/27/17 1701 01/27/17 2126 01/27/17 2347 01/28/17 0436 01/28/17 0741  GLUCAP 112* 102* 80 84 80    Microbiology: Results for orders placed or performed during the hospital encounter of 10/13/14  Urine culture     Status: None   Collection Time: 10/13/14  2:15 PM  Result Value Ref Range Status   Specimen Description URINE, CLEAN CATCH  Final   Special Requests NONE  Final   Culture INSIGNIFICANT GROWTH  Final   Report Status 10/14/2014 FINAL  Final    Coagulation Studies: Recent Labs    01/27/17 0847  LABPROT 14.5  INR 1.14    Urinalysis:  Recent Labs  Lab 01/27/17 1023  COLORURINE STRAW*  LABSPEC 1.020  PHURINE 7.0  GLUCOSEU NEGATIVE  HGBUR NEGATIVE  BILIRUBINUR NEGATIVE  KETONESUR NEGATIVE  PROTEINUR NEGATIVE  NITRITE NEGATIVE  LEUKOCYTESUR NEGATIVE    Lipid Panel:    Component Value Date/Time   CHOL 160 12/24/2014 0445   TRIG 82 12/24/2014 0445   HDL 40 (L) 12/24/2014 0445   CHOLHDL 4.0 12/24/2014 0445   VLDL 16 12/24/2014 0445   LDLCALC 104 (H) 12/24/2014 0445    HgbA1C:  Lab Results  Component Value Date   HGBA1C 5.7 (H) 01/27/2017    Urine Drug Screen:  No results found for: LABOPIA, COCAINSCRNUR, LABBENZ, AMPHETMU, THCU, LABBARB  Alcohol Level: No results for input(s): ETH in the last 168 hours.  Other results: EKG: sinus rhythm at 67 bpm.  Imaging: Ct Angio Head W Or Wo Contrast  Result Date: 01/27/2017 CLINICAL DATA:  Sudden onset altered mental status. Fixed right-sided gaze. EXAM: CT ANGIOGRAPHY HEAD AND NECK TECHNIQUE: Multidetector CT imaging of the head and neck was performed using the standard protocol during bolus administration of intravenous contrast. Multiplanar CT image reconstructions and MIPs were obtained to evaluate the vascular anatomy. Carotid stenosis measurements (when applicable) are obtained utilizing NASCET criteria, using the distal internal carotid  diameter as the denominator. CONTRAST:  75mL ISOVUE-370 IOPAMIDOL (ISOVUE-370) INJECTION 76% COMPARISON:  CT head without contrast from the same day. FINDINGS: CTA NECK FINDINGS Aortic arch: There is a common origin of the left common carotid artery in the innominate artery. Atherosclerotic calcifications are present distal to the great vessel origins. There is  no significant stenosis at the origins of the great vessels. Right carotid system: The right common carotid artery is within normal limits. Bifurcation is unremarkable. There is moderate tortuosity of the scratched at there is mild tortuosity of the cervical right ICA without significant stenosis. Left carotid system: Mild atherosclerotic changes are present in the mid left common carotid artery without significant stenosis. Calcified and noncalcified plaque is present at the distal left common carotid artery and bifurcation there is no significant stenosis of greater than 50% relative to the more distal vessel. There is mild tortuosity in the left cervical ICA without other stenosis. Vertebral arteries: The vertebral arteries originate from the subclavian arteries bilaterally without significant stenosis. The vertebral arteries are codominant. There is some tortuosity but no focal stenosis of either vertebral artery in the neck. Skeleton: Chronic endplate changes are noted in the cervical spine with osseous foraminal narrowing bilaterally at C3-4 and C6-7 most notably. No focal lytic or blastic lesions are present. Other neck: A large multinodular goiter is present. The gland is largest on the left with greatest measurements of 6.9 x 10.3 x 4.0 cm. No dominant lesion is present. There is no significant adenopathy. Salivary glands are within normal limits. Upper chest: Mild dependent atelectasis is present in both lungs. No focal nodule, mass, or airspace disease is present. Review of the MIP images confirms the above findings CTA HEAD FINDINGS Anterior  circulation: Atherosclerotic calcifications are present within the cavernous internal carotid artery is. The 50% stenosis is present in the right cavernous internal carotid artery. Calcifications are present in the left cavernous internal carotid artery without significant stenosis. The ICA termini are intact bilaterally. The right A1 segment is aplastic. Both A2 segments fill from the left with a patent anterior communicating artery. There is mild irregularity of the left M1 segment without a focal stenosis. A moderate to high-grade stenosis is present in the mid right M1 segment. MCA bifurcations are intact. Branch vessels fill on both sides. Caliber is more attenuated on the right, suggesting decreased profusion. Distal segmental narrowing is evident bilaterally without a significant proximal stenosis or occlusion otherwise. Posterior circulation: The vertebral arteries are codominant. PICA origins are visualized and normal. Basilar artery is small. The left posterior cerebral artery originates from the basilar tip with contribution from a left posterior communicating artery. The right posterior cerebral artery is of fetal type. There is asymmetric attenuation of distal right PCA branch vessels compared to the left. Venous sinuses: The dural sinuses are patent. The straight sinus and deep cerebral veins are intact. Cortical veins are unremarkable. Anatomic variants: The fetal type right posterior cerebral artery. Prominent left posterior communicating artery. Delayed phase: Not performed. Review of the MIP images confirms the above findings IMPRESSION: 1. Moderate to high-grade stenosis of the mid right M1 segment. This could certainly impact flow to the right MCA branch vessels. 2. Asymmetric attenuation of right MCA branch vessels compared to the left, the likely suggesting decreased flow. 3. Atherosclerotic changes in the left common carotid artery and at the left carotid bifurcation 50% stenosis relative to  the more distal vessel. 4. Atherosclerotic changes in the left cavernous internal carotid artery with a 50% stenosis. 5. Fetal type right posterior cerebral artery. 6. Asymmetric attenuation of right PCA branches compared to the left. 7. Multilevel spondylosis of the cervical spine. 8. Enlarged multinodular goiter of the thyroid. These results were called by telephone at the time of interpretation on 01/27/2017 at 9:38 am to Dr. Delman Kitten ,  who verbally acknowledged these results. Electronically Signed   By: San Morelle M.D.   On: 01/27/2017 09:51   Ct Angio Neck W And/or Wo Contrast  Result Date: 01/27/2017 CLINICAL DATA:  Sudden onset altered mental status. Fixed right-sided gaze. EXAM: CT ANGIOGRAPHY HEAD AND NECK TECHNIQUE: Multidetector CT imaging of the head and neck was performed using the standard protocol during bolus administration of intravenous contrast. Multiplanar CT image reconstructions and MIPs were obtained to evaluate the vascular anatomy. Carotid stenosis measurements (when applicable) are obtained utilizing NASCET criteria, using the distal internal carotid diameter as the denominator. CONTRAST:  77mL ISOVUE-370 IOPAMIDOL (ISOVUE-370) INJECTION 76% COMPARISON:  CT head without contrast from the same day. FINDINGS: CTA NECK FINDINGS Aortic arch: There is a common origin of the left common carotid artery in the innominate artery. Atherosclerotic calcifications are present distal to the great vessel origins. There is no significant stenosis at the origins of the great vessels. Right carotid system: The right common carotid artery is within normal limits. Bifurcation is unremarkable. There is moderate tortuosity of the scratched at there is mild tortuosity of the cervical right ICA without significant stenosis. Left carotid system: Mild atherosclerotic changes are present in the mid left common carotid artery without significant stenosis. Calcified and noncalcified plaque is present  at the distal left common carotid artery and bifurcation there is no significant stenosis of greater than 50% relative to the more distal vessel. There is mild tortuosity in the left cervical ICA without other stenosis. Vertebral arteries: The vertebral arteries originate from the subclavian arteries bilaterally without significant stenosis. The vertebral arteries are codominant. There is some tortuosity but no focal stenosis of either vertebral artery in the neck. Skeleton: Chronic endplate changes are noted in the cervical spine with osseous foraminal narrowing bilaterally at C3-4 and C6-7 most notably. No focal lytic or blastic lesions are present. Other neck: A large multinodular goiter is present. The gland is largest on the left with greatest measurements of 6.9 x 10.3 x 4.0 cm. No dominant lesion is present. There is no significant adenopathy. Salivary glands are within normal limits. Upper chest: Mild dependent atelectasis is present in both lungs. No focal nodule, mass, or airspace disease is present. Review of the MIP images confirms the above findings CTA HEAD FINDINGS Anterior circulation: Atherosclerotic calcifications are present within the cavernous internal carotid artery is. The 50% stenosis is present in the right cavernous internal carotid artery. Calcifications are present in the left cavernous internal carotid artery without significant stenosis. The ICA termini are intact bilaterally. The right A1 segment is aplastic. Both A2 segments fill from the left with a patent anterior communicating artery. There is mild irregularity of the left M1 segment without a focal stenosis. A moderate to high-grade stenosis is present in the mid right M1 segment. MCA bifurcations are intact. Branch vessels fill on both sides. Caliber is more attenuated on the right, suggesting decreased profusion. Distal segmental narrowing is evident bilaterally without a significant proximal stenosis or occlusion otherwise.  Posterior circulation: The vertebral arteries are codominant. PICA origins are visualized and normal. Basilar artery is small. The left posterior cerebral artery originates from the basilar tip with contribution from a left posterior communicating artery. The right posterior cerebral artery is of fetal type. There is asymmetric attenuation of distal right PCA branch vessels compared to the left. Venous sinuses: The dural sinuses are patent. The straight sinus and deep cerebral veins are intact. Cortical veins are unremarkable. Anatomic variants: The fetal  type right posterior cerebral artery. Prominent left posterior communicating artery. Delayed phase: Not performed. Review of the MIP images confirms the above findings IMPRESSION: 1. Moderate to high-grade stenosis of the mid right M1 segment. This could certainly impact flow to the right MCA branch vessels. 2. Asymmetric attenuation of right MCA branch vessels compared to the left, the likely suggesting decreased flow. 3. Atherosclerotic changes in the left common carotid artery and at the left carotid bifurcation 50% stenosis relative to the more distal vessel. 4. Atherosclerotic changes in the left cavernous internal carotid artery with a 50% stenosis. 5. Fetal type right posterior cerebral artery. 6. Asymmetric attenuation of right PCA branches compared to the left. 7. Multilevel spondylosis of the cervical spine. 8. Enlarged multinodular goiter of the thyroid. These results were called by telephone at the time of interpretation on 01/27/2017 at 9:38 am to Dr. Delman Kitten , who verbally acknowledged these results. Electronically Signed   By: San Morelle M.D.   On: 01/27/2017 09:51   Mr Brain Wo Contrast  Result Date: 01/28/2017 CLINICAL DATA:  Altered mental status.  Found unresponsive. EXAM: MRI HEAD WITHOUT CONTRAST TECHNIQUE: Multiplanar, multiecho pulse sequences of the brain and surrounding structures were obtained without intravenous  contrast. COMPARISON:  Head CT and CTA 01/27/2017.  Brain MRI 12/24/2014. FINDINGS: Brain: There is no evidence of acute infarct, acute intracranial hemorrhage, mass, midline shift, or extra-axial fluid collection. A small chronic left parieto-occipital infarct is again noted with a small amount of associated chronic blood products. Scattered T2 hyperintensities in the cerebral white matter bilaterally are stable to minimally increased compared to the prior MRI and compatible with mild chronic small vessel ischemic disease. There is moderate cerebral atrophy. Vascular: Major intracranial vascular flow voids are preserved. Skull and upper cervical spine: No suspicious marrow lesion. Sinuses/Orbits: Unremarkable orbits. Small left maxillary sinus mucous retention cyst. Minimal left mastoid air cell mucosal edema. Other: None. IMPRESSION: 1. No acute intracranial abnormality. 2. Mild chronic small vessel ischemic disease. Chronic left parieto-occipital infarct. Electronically Signed   By: Logan Bores M.D.   On: 01/28/2017 10:04   Dg Chest Portable 1 View  Result Date: 01/27/2017 CLINICAL DATA:  Altered mental status.  Found unresponsive. EXAM: PORTABLE CHEST 1 VIEW COMPARISON:  Acute abdominal series 05/06/2016. FINDINGS: The heart is enlarged. Aortic atherosclerosis is present. There is no edema or effusions. Mild bibasilar atelectasis present. A large thyroid goiter is again noted. IMPRESSION: 1. Cardiomegaly without failure. 2. Mild bibasilar atelectasis. 3. Prominent thyroid goiter. Electronically Signed   By: San Morelle M.D.   On: 01/27/2017 10:03   Ct Head Code Stroke Wo Contrast  Result Date: 01/27/2017 CLINICAL DATA:  Code stroke. Fixed right-sided gaze. Lethargy. Altered mental status. EXAM: CT HEAD WITHOUT CONTRAST TECHNIQUE: Contiguous axial images were obtained from the base of the skull through the vertex without intravenous contrast. COMPARISON:  CT head without contrast 02/14/2015  FINDINGS: Brain: The a remote left parietal lobe infarct is stable. No acute cortical infarct is present. Moderate atrophy and diffuse white matter changes are otherwise stable. Ventricles are proportionate to the degree of atrophy. No significant extra-axial fluid collection is present. The brainstem and cerebellum are normal. Vascular: Atherosclerotic calcifications are present within the cavernous internal carotid artery is bilaterally. There is no hyperdense vessel. Skull: Calvarium is intact. No focal lytic or blastic lesions are present. Sinuses/Orbits: The paranasal sinuses and mastoid air cells are clear. Globes and orbits are within normal limits bilaterally. ASPECTS [facility] Stroke Program  Early CT Score) - Ganglionic level infarction (caudate, lentiform nuclei, internal capsule, insula, M1-M3 cortex): 7/7 - Supraganglionic infarction (M4-M6 cortex): 3/3 Total score (0-10 with 10 being normal): 10/10 IMPRESSION: 1. Stable remote left parietal lobe infarct. 2. No acute cortical infarct. 3. Stable atrophy and white matter disease. 4. ASPECTS is 10/10 These results were called by telephone at the time of interpretation on 01/27/2017 at 9:15 am to Dr. Delman Kitten , who verbally acknowledged these results. Electronically Signed   By: San Morelle M.D.   On: 01/27/2017 09:18    Assessment: 83 y.o. female presenting after an episode of unresponsiveness.  Noted to have some slurred speech and right eye deviation.  MR of the brain reviewed and shows no acute changes.  Evidence of a chronic left occipitoparietal infarct noted as well.  Patient reports no history of infarct.  CTA shows moderate to severe right M1 stenosis.   Unclear etiology for symptoms.  Although no evidence of acute infarct, can not rule out TIA.  Seizure on the differential as well with evidence of old infarct.  Also since patient on the toilet, vasovagal etiology in the differential as well.   A1c 5.7.    Stroke Risk Factors -  hypertension  Plan: 1. Fasting lipid panel 2. PT consult, OT consult, Speech consult 3. Echocardiogram pending 4. Prophylactic therapy-Continue Eliquis 5. Telemetry monitoring 6. Frequent neuro checks 7. EEG 8. Orthostatic vitals   Alexis Goodell, MD Neurology [phone] 01/28/2017, 11:50 AM

## 2017-01-28 NOTE — Evaluation (Signed)
Clinical/Bedside Swallow Evaluation Patient Details  Name: Katelyn [name] MRN: [id number] Date of Birth: [birth year]  Today's Date: 01/28/2017 Time: SLP Start Time (ACUTE ONLY): 1444 SLP Stop Time (ACUTE ONLY): 1540 SLP Time Calculation (min) (ACUTE ONLY): 56 min  Past Medical History:  Past Medical History:  Diagnosis Date  . Abdominal pain   . Anxiety   . Atherosclerosis   . Chronic kidney disease    stage 3  . Colon polyps   . Constipation   . Cough with expectoration   . Dependent edema bilateral legs  . Gait abnormality   . Gout   . Hearing loss   . Hearing loss   . Hemorrhoids   . Hypertension   . Incontinence   . Osteoarthritis   . Osteoarthritis   . Osteoarthritis   . Paget's disease of bone   . Peripheral vascular disease ([place])   . Pneumonia   . Proteinuria   . Stroke [facility])    Past Surgical History:  Past Surgical History:  Procedure Laterality Date  . Abdominal Aortogram w/Lower Extremity N/A 01/13/2015   Performed by Algernon Huxley, MD at [facility]  . Lower Extremity Intervention  01/13/2015   Performed by Algernon Huxley, MD at [facility]  . PARTIAL HYSTERECTOMY     HPI:  Pt is an 83 y.o. female w/ multiple medical issues including stroke, HTN, PVD, OA, and HOH who is presenting after being found unresponsive at home.  Patient is amnestic of the event.  All history obtained from daughter that was not present during the event.  Patient was with her daughter who left her in the bathroom.  When she returned from the kitchen she found the patient on the toilet but unresponsive.  She had not fallen to the floor.  EMS was called.  Patient eventually improved somewhat. Initial NIHSS of 4.  Daughter reports pt is back to baseline now.  At baseline, pt is [facility] and can sometimes miss information being said if increased distractions/noise.   Assessment / Plan / Recommendation Clinical Impression  Pt appears to present w/ adequate oropharyngeal  phase swallow function w/ no overt s/s of aspiration w/ trials of thin liquids and purees/soft solids. Pt consumed po trials w/ no apparent difficulty during oral phase w/ bolus management and clearing when given time for mastication d/t missing some dentition(baseline) - pt is missing some native dentition but manages well w/ soft foods at home per Dtr. This was followed by timely transfer and swallow w/ all po trials. Pt exhibited no decline in vocal quality or respiratory status during po trials. Family endorsed pt had been doing "well" drinking thin liquids since admission per MD order. Pt has also been swallowing Pills whole w/ water. Recommend a mech soft/regular diet consistency w/ thin liquids w/ general aspiration precautions as discussed w/ pt/family; instruction given on swallowing Pills w/ a puree(applesauce) if needed in future and NOT using large drink straws to monitor bolus size when drinking thin liquids - Dtr was grateful for this information given. No further skilled ST services indicated at this time as pt appears at her baseline w/ regard to oropharyngeal phase swallowing. Pt/family agreed. NSG to reconsult if any decline in status is noted during this admission. Of note, general instruction given on using the Incentive Spirometer(in room for pt to use) for pulmonary exercise while more sedentary at this time. Pt practiced using it; no questions per Dtr/pt. Encouraged them to f/u w/  NSG if needed.  SLP Visit Diagnosis: Dysphagia, unspecified (R13.10)    Aspiration Risk  (reduced)    Diet Recommendation  Mech Soft/regular w/ Thin liquids; general aspiration precautions; tray setup at meals. No large straws  Medication Administration: Whole meds with liquid(but WHOLE in puree if easier for swallowing)    Other  Recommendations Recommended Consults: (n/a) Oral Care Recommendations: Oral care BID;Patient independent with oral care;Staff/trained caregiver to provide oral care Other  Recommendations: (n/a)   Follow up Recommendations None      Frequency and Duration (n/a)          Prognosis Prognosis for Safe Diet Advancement: Good Barriers to Reach Goals: (none)      Swallow Study   General Date of Onset: 01/27/17 HPI: Pt is an 83 y.o. female w/ multiple medical issues including stroke, HTN, PVD, OA, and HOH who is presenting after being found unresponsive at home.  Patient is amnestic of the event.  All history obtained from daughter that was not present during the event.  Patient was with her daughter who left her in the bathroom.  When she returned from the kitchen she found the patient on the toilet but unresponsive.  She had not fallen to the floor.  EMS was called.  Patient eventually improved somewhat. Initial NIHSS of 4.  Daughter reports pt is back to baseline now.  At baseline, pt is [facility] and can sometimes miss information being said if increased distractions/noise. Type of Study: Bedside Swallow Evaluation Previous Swallow Assessment: none recent Diet Prior to this Study: Regular;Thin liquids(some softer foods at home d/t missing some dentition) Temperature Spikes Noted: No(wbc 5.8) Respiratory Status: Room air History of Recent Intubation: No Behavior/Cognition: Alert;Cooperative;Pleasant mood;Distractible;Requires cueing Oral Cavity Assessment: Within Functional Limits Oral Care Completed by SLP: Recent completion by staff Oral Cavity - Dentition: Missing dentition(native) Vision: Functional for self-feeding Self-Feeding Abilities: Able to feed self;Needs set up;Needs assist Patient Positioning: Upright in bed Baseline Vocal Quality: Normal Volitional Cough: Strong Volitional Swallow: Able to elicit    Oral/Motor/Sensory Function Overall Oral Motor/Sensory Function: Within functional limits   Ice Chips Ice chips: Not tested Other Comments: already on a thin liquids diet(clear liquid per MD)   Thin Liquid Thin Liquid: Within functional  limits Presentation: Cup;Self Fed;Straw(~3 ozs)    Nectar Thick Nectar Thick Liquid: Not tested   Honey Thick Honey Thick Liquid: Not tested   Puree Puree: Within functional limits Presentation: Self Fed;Spoon(assisted; ~3 ozs)   Solid   GO   Solid: Impaired Presentation: Self Fed;Spoon(4-5 pieces of graham crackers) Oral Phase Impairments: Impaired mastication(missing some dentition) Oral Phase Functional Implications: (time needed) Pharyngeal Phase Impairments: (none) Other Comments: pt is missing some native dentition baseline but manages well w/ soft foods at home per Dtr    Functional Assessment Tool Used: clinical judgement Functional Limitations: Swallowing Swallow Current Status (U5427): At least 1 percent but less than 20 percent impaired, limited or restricted Swallow Goal Status [phone]): At least 1 percent but less than 20 percent impaired, limited or restricted Swallow Discharge Status [phone]): At least 1 percent but less than 20 percent impaired, limited or restricted    Orinda Kenner, MS, CCC-SLP Johnney Scarlata 01/28/2017,3:41 PM

## 2017-01-28 NOTE — Progress Notes (Signed)
OT Cancellation Note  Patient Details Name: Katelyn [name] MRN: [id number] DOB: [birth year]   Cancelled Treatment:    Reason Eval/Treat Not Completed: Patient at procedure or test/ unavailable. Order received, chart reviewed. Pt out of room for testing. Will re-attempt OT evaluation at later date/time as pt is available and medically appropriate.  Jeni Salles, MPH, MS, OTR/L ascom [phone] 01/28/17, 1:06 PM

## 2017-01-28 NOTE — Progress Notes (Signed)
PT Cancellation Note  Patient Details Name: [name] MRN: [id number] DOB: [birth year]   Cancelled Treatment:    Reason Eval/Treat Not Completed: Patient at procedure or test/unavailable(Consult received and chart reviewed. Patient currently off unit for diagnostic imaging.  Will re-attempt at later time/date as patient medically appropriate and available.)   Jenille Laszlo H. Owens Shark, PT, DPT, NCS 01/28/17, 9:45 AM [phone]

## 2017-01-28 NOTE — Care Management (Signed)
RNCM notified by daughter that patient is followed by [facility]. She denies RNCM needs. [facility] representative is here visiting with family. Patient is currently in MRI.

## 2017-01-28 NOTE — Progress Notes (Signed)
PT Cancellation Note  Patient Details Name: Denika Krone MRN: [id number] DOB: August 18, 1923   Cancelled Treatment:    Reason Eval/Treat Not Completed: Patient at procedure or test/unavailable(Second attempt at evaluation.  Patient returned to room, but with nursing and attending physician in room.  Speech therapist awaiting entry afterwards.  Will continue efforts at later time/date as patient available.)  Samer Dutton H. Owens Shark, PT, DPT, NCS 01/28/17, 3:29 PM [phone]

## 2017-01-28 NOTE — Progress Notes (Addendum)
[facility] [place]: Katelyn [name]    MR#:  [id number]  DATE OF BIRTH:  26-Nov-1923  SUBJECTIVE:  CHIEF COMPLAINT:   Chief Complaint  Patient presents with  . Altered Mental Status   -admitted for unresponsiveness, patient does not remember what happened yesterday. -Completely alert and no slurred speech at this time.  REVIEW OF SYSTEMS:  Review of Systems  Constitutional: Negative for chills, fever and malaise/fatigue.  Eyes: Positive for blurred vision.  Respiratory: Negative for cough, shortness of breath and wheezing.   Cardiovascular: Negative for chest pain, palpitations and leg swelling.  Gastrointestinal: Negative for abdominal pain, constipation, diarrhea, nausea and vomiting.  Genitourinary: Negative for dysuria.  Musculoskeletal: Negative for myalgias.  Neurological: Negative for dizziness, speech change, focal weakness, seizures and headaches.  Psychiatric/Behavioral: Negative for depression.  All other systems reviewed and are negative.   DRUG ALLERGIES:   Allergies  Allergen Reactions  . Bee Venom Anaphylaxis    VITALS:  Blood pressure (!) 146/66, pulse 87, temperature 97.8 F (36.6 C), temperature source Oral, resp. rate 17, height 5\' 4"  (1.626 m), weight 76.8 kg (169 lb 6.4 oz), SpO2 98 %.  PHYSICAL EXAMINATION:  Physical Exam  GENERAL:  84 y.o.-year-old elderly patient lying in the bed with no acute distress.  EYES: Pupils equal, round, reactive to light and accommodation. No scleral icterus. Extraocular muscles intact.  HEENT: Head atraumatic, normocephalic. Oropharynx and nasopharynx clear.  NECK:  Supple, no jugular venous distention. No thyroid enlargement, no tenderness.  LUNGS: Normal breath sounds bilaterally, no wheezing, rales,rhonchi or crepitation. No use of accessory muscles of respiration. Decreased bibasilar breath sounds CARDIOVASCULAR: S1, S2 normal. No murmurs, rubs, or gallops.    ABDOMEN: Soft, nontender, nondistended. Bowel sounds present. No organomegaly or mass.  EXTREMITIES: No pedal edema, cyanosis, or clubbing.  NEUROLOGIC: Cranial nerves II through XII are intact. Speech is clear. Muscle strength 5/5 in all extremities. Following commands. Sensation intact. Gait not checked.  PSYCHIATRIC: The patient is alert and oriented x 3.  SKIN: No obvious rash, lesion, or ulcer.    LABORATORY PANEL:   CBC Recent Labs  Lab 01/28/17 0426  WBC 5.8  HGB 11.2*  HCT 33.6*  PLT 180   ------------------------------------------------------------------------------------------------------------------  Chemistries  Recent Labs  Lab 01/28/17 0426  NA 139  K 3.7  CL 108  CO2 23  GLUCOSE 87  BUN 16  CREATININE 1.00  CALCIUM 8.5*  AST 16  ALT 8*  ALKPHOS 53  BILITOT 0.5   ------------------------------------------------------------------------------------------------------------------  Cardiac Enzymes Recent Labs  Lab 01/27/17 0847  TROPONINI <0.03   ------------------------------------------------------------------------------------------------------------------  RADIOLOGY:  Ct Angio Head W Or Wo Contrast  Result Date: 01/27/2017 CLINICAL DATA:  Sudden onset altered mental status. Fixed right-sided gaze. EXAM: CT ANGIOGRAPHY HEAD AND NECK TECHNIQUE: Multidetector CT imaging of the head and neck was performed using the standard protocol during bolus administration of intravenous contrast. Multiplanar CT image reconstructions and MIPs were obtained to evaluate the vascular anatomy. Carotid stenosis measurements (when applicable) are obtained utilizing NASCET criteria, using the distal internal carotid diameter as the denominator. CONTRAST:  64mL ISOVUE-370 IOPAMIDOL (ISOVUE-370) INJECTION 76% COMPARISON:  CT head without contrast from the same day. FINDINGS: CTA NECK FINDINGS Aortic arch: There is a common origin of the left common carotid artery in the  innominate artery. Atherosclerotic calcifications are present distal to the great vessel origins. There is no significant stenosis at the origins of the great vessels.  Right carotid system: The right common carotid artery is within normal limits. Bifurcation is unremarkable. There is moderate tortuosity of the scratched at there is mild tortuosity of the cervical right ICA without significant stenosis. Left carotid system: Mild atherosclerotic changes are present in the mid left common carotid artery without significant stenosis. Calcified and noncalcified plaque is present at the distal left common carotid artery and bifurcation there is no significant stenosis of greater than 50% relative to the more distal vessel. There is mild tortuosity in the left cervical ICA without other stenosis. Vertebral arteries: The vertebral arteries originate from the subclavian arteries bilaterally without significant stenosis. The vertebral arteries are codominant. There is some tortuosity but no focal stenosis of either vertebral artery in the neck. Skeleton: Chronic endplate changes are noted in the cervical spine with osseous foraminal narrowing bilaterally at C3-4 and C6-7 most notably. No focal lytic or blastic lesions are present. Other neck: A large multinodular goiter is present. The gland is largest on the left with greatest measurements of 6.9 x 10.3 x 4.0 cm. No dominant lesion is present. There is no significant adenopathy. Salivary glands are within normal limits. Upper chest: Mild dependent atelectasis is present in both lungs. No focal nodule, mass, or airspace disease is present. Review of the MIP images confirms the above findings CTA HEAD FINDINGS Anterior circulation: Atherosclerotic calcifications are present within the cavernous internal carotid artery is. The 50% stenosis is present in the right cavernous internal carotid artery. Calcifications are present in the left cavernous internal carotid artery without  significant stenosis. The ICA termini are intact bilaterally. The right A1 segment is aplastic. Both A2 segments fill from the left with a patent anterior communicating artery. There is mild irregularity of the left M1 segment without a focal stenosis. A moderate to high-grade stenosis is present in the mid right M1 segment. MCA bifurcations are intact. Branch vessels fill on both sides. Caliber is more attenuated on the right, suggesting decreased profusion. Distal segmental narrowing is evident bilaterally without a significant proximal stenosis or occlusion otherwise. Posterior circulation: The vertebral arteries are codominant. PICA origins are visualized and normal. Basilar artery is small. The left posterior cerebral artery originates from the basilar tip with contribution from a left posterior communicating artery. The right posterior cerebral artery is of fetal type. There is asymmetric attenuation of distal right PCA branch vessels compared to the left. Venous sinuses: The dural sinuses are patent. The straight sinus and deep cerebral veins are intact. Cortical veins are unremarkable. Anatomic variants: The fetal type right posterior cerebral artery. Prominent left posterior communicating artery. Delayed phase: Not performed. Review of the MIP images confirms the above findings IMPRESSION: 1. Moderate to high-grade stenosis of the mid right M1 segment. This could certainly impact flow to the right MCA branch vessels. 2. Asymmetric attenuation of right MCA branch vessels compared to the left, the likely suggesting decreased flow. 3. Atherosclerotic changes in the left common carotid artery and at the left carotid bifurcation 50% stenosis relative to the more distal vessel. 4. Atherosclerotic changes in the left cavernous internal carotid artery with a 50% stenosis. 5. Fetal type right posterior cerebral artery. 6. Asymmetric attenuation of right PCA branches compared to the left. 7. Multilevel spondylosis of  the cervical spine. 8. Enlarged multinodular goiter of the thyroid. These results were called by telephone at the time of interpretation on 01/27/2017 at 9:38 am to Dr. Delman Kitten , who verbally acknowledged these results. Electronically Signed   By:  San Morelle M.D.   On: 01/27/2017 09:51   Ct Angio Neck W And/or Wo Contrast  Result Date: 01/27/2017 CLINICAL DATA:  Sudden onset altered mental status. Fixed right-sided gaze. EXAM: CT ANGIOGRAPHY HEAD AND NECK TECHNIQUE: Multidetector CT imaging of the head and neck was performed using the standard protocol during bolus administration of intravenous contrast. Multiplanar CT image reconstructions and MIPs were obtained to evaluate the vascular anatomy. Carotid stenosis measurements (when applicable) are obtained utilizing NASCET criteria, using the distal internal carotid diameter as the denominator. CONTRAST:  54mL ISOVUE-370 IOPAMIDOL (ISOVUE-370) INJECTION 76% COMPARISON:  CT head without contrast from the same day. FINDINGS: CTA NECK FINDINGS Aortic arch: There is a common origin of the left common carotid artery in the innominate artery. Atherosclerotic calcifications are present distal to the great vessel origins. There is no significant stenosis at the origins of the great vessels. Right carotid system: The right common carotid artery is within normal limits. Bifurcation is unremarkable. There is moderate tortuosity of the scratched at there is mild tortuosity of the cervical right ICA without significant stenosis. Left carotid system: Mild atherosclerotic changes are present in the mid left common carotid artery without significant stenosis. Calcified and noncalcified plaque is present at the distal left common carotid artery and bifurcation there is no significant stenosis of greater than 50% relative to the more distal vessel. There is mild tortuosity in the left cervical ICA without other stenosis. Vertebral arteries: The vertebral arteries  originate from the subclavian arteries bilaterally without significant stenosis. The vertebral arteries are codominant. There is some tortuosity but no focal stenosis of either vertebral artery in the neck. Skeleton: Chronic endplate changes are noted in the cervical spine with osseous foraminal narrowing bilaterally at C3-4 and C6-7 most notably. No focal lytic or blastic lesions are present. Other neck: A large multinodular goiter is present. The gland is largest on the left with greatest measurements of 6.9 x 10.3 x 4.0 cm. No dominant lesion is present. There is no significant adenopathy. Salivary glands are within normal limits. Upper chest: Mild dependent atelectasis is present in both lungs. No focal nodule, mass, or airspace disease is present. Review of the MIP images confirms the above findings CTA HEAD FINDINGS Anterior circulation: Atherosclerotic calcifications are present within the cavernous internal carotid artery is. The 50% stenosis is present in the right cavernous internal carotid artery. Calcifications are present in the left cavernous internal carotid artery without significant stenosis. The ICA termini are intact bilaterally. The right A1 segment is aplastic. Both A2 segments fill from the left with a patent anterior communicating artery. There is mild irregularity of the left M1 segment without a focal stenosis. A moderate to high-grade stenosis is present in the mid right M1 segment. MCA bifurcations are intact. Branch vessels fill on both sides. Caliber is more attenuated on the right, suggesting decreased profusion. Distal segmental narrowing is evident bilaterally without a significant proximal stenosis or occlusion otherwise. Posterior circulation: The vertebral arteries are codominant. PICA origins are visualized and normal. Basilar artery is small. The left posterior cerebral artery originates from the basilar tip with contribution from a left posterior communicating artery. The right  posterior cerebral artery is of fetal type. There is asymmetric attenuation of distal right PCA branch vessels compared to the left. Venous sinuses: The dural sinuses are patent. The straight sinus and deep cerebral veins are intact. Cortical veins are unremarkable. Anatomic variants: The fetal type right posterior cerebral artery. Prominent left posterior communicating artery.  Delayed phase: Not performed. Review of the MIP images confirms the above findings IMPRESSION: 1. Moderate to high-grade stenosis of the mid right M1 segment. This could certainly impact flow to the right MCA branch vessels. 2. Asymmetric attenuation of right MCA branch vessels compared to the left, the likely suggesting decreased flow. 3. Atherosclerotic changes in the left common carotid artery and at the left carotid bifurcation 50% stenosis relative to the more distal vessel. 4. Atherosclerotic changes in the left cavernous internal carotid artery with a 50% stenosis. 5. Fetal type right posterior cerebral artery. 6. Asymmetric attenuation of right PCA branches compared to the left. 7. Multilevel spondylosis of the cervical spine. 8. Enlarged multinodular goiter of the thyroid. These results were called by telephone at the time of interpretation on 01/27/2017 at 9:38 am to Dr. Delman Kitten , who verbally acknowledged these results. Electronically Signed   By: San Morelle M.D.   On: 01/27/2017 09:51   Mr Brain Wo Contrast  Result Date: 01/28/2017 CLINICAL DATA:  Altered mental status.  Found unresponsive. EXAM: MRI HEAD WITHOUT CONTRAST TECHNIQUE: Multiplanar, multiecho pulse sequences of the brain and surrounding structures were obtained without intravenous contrast. COMPARISON:  Head CT and CTA 01/27/2017.  Brain MRI 12/24/2014. FINDINGS: Brain: There is no evidence of acute infarct, acute intracranial hemorrhage, mass, midline shift, or extra-axial fluid collection. A small chronic left parieto-occipital infarct is again  noted with a small amount of associated chronic blood products. Scattered T2 hyperintensities in the cerebral white matter bilaterally are stable to minimally increased compared to the prior MRI and compatible with mild chronic small vessel ischemic disease. There is moderate cerebral atrophy. Vascular: Major intracranial vascular flow voids are preserved. Skull and upper cervical spine: No suspicious marrow lesion. Sinuses/Orbits: Unremarkable orbits. Small left maxillary sinus mucous retention cyst. Minimal left mastoid air cell mucosal edema. Other: None. IMPRESSION: 1. No acute intracranial abnormality. 2. Mild chronic small vessel ischemic disease. Chronic left parieto-occipital infarct. Electronically Signed   By: Logan Bores M.D.   On: 01/28/2017 10:04   Dg Chest Portable 1 View  Result Date: 01/27/2017 CLINICAL DATA:  Altered mental status.  Found unresponsive. EXAM: PORTABLE CHEST 1 VIEW COMPARISON:  Acute abdominal series 05/06/2016. FINDINGS: The heart is enlarged. Aortic atherosclerosis is present. There is no edema or effusions. Mild bibasilar atelectasis present. A large thyroid goiter is again noted. IMPRESSION: 1. Cardiomegaly without failure. 2. Mild bibasilar atelectasis. 3. Prominent thyroid goiter. Electronically Signed   By: San Morelle M.D.   On: 01/27/2017 10:03   Ct Head Code Stroke Wo Contrast  Result Date: 01/27/2017 CLINICAL DATA:  Code stroke. Fixed right-sided gaze. Lethargy. Altered mental status. EXAM: CT HEAD WITHOUT CONTRAST TECHNIQUE: Contiguous axial images were obtained from the base of the skull through the vertex without intravenous contrast. COMPARISON:  CT head without contrast 02/14/2015 FINDINGS: Brain: The a remote left parietal lobe infarct is stable. No acute cortical infarct is present. Moderate atrophy and diffuse white matter changes are otherwise stable. Ventricles are proportionate to the degree of atrophy. No significant extra-axial fluid  collection is present. The brainstem and cerebellum are normal. Vascular: Atherosclerotic calcifications are present within the cavernous internal carotid artery is bilaterally. There is no hyperdense vessel. Skull: Calvarium is intact. No focal lytic or blastic lesions are present. Sinuses/Orbits: The paranasal sinuses and mastoid air cells are clear. Globes and orbits are within normal limits bilaterally. ASPECTS [facility] Stroke Program Early CT Score) - Ganglionic level infarction (caudate, lentiform nuclei,  internal capsule, insula, M1-M3 cortex): 7/7 - Supraganglionic infarction (M4-M6 cortex): 3/3 Total score (0-10 with 10 being normal): 10/10 IMPRESSION: 1. Stable remote left parietal lobe infarct. 2. No acute cortical infarct. 3. Stable atrophy and white matter disease. 4. ASPECTS is 10/10 These results were called by telephone at the time of interpretation on 01/27/2017 at 9:15 am to Dr. Delman Kitten , who verbally acknowledged these results. Electronically Signed   By: San Morelle M.D.   On: 01/27/2017 09:18    EKG:   Orders placed or performed during the hospital encounter of 01/27/17  . ED EKG  . ED EKG  . EKG 12-Lead  . EKG 12-Lead    ASSESSMENT AND PLAN:   83 year old female with past medical history CVA, hypertension, peripheral vascular disease, CKD, mild dementia was brought in by family secondary to unresponsive episode.  #1 unresponsiveness-could be TIA, MRI of the brain did not reveal any acute findings though has chronic left occipitoparietal infarct. -CT angiogram showing moderate to severe right MCA stenosis -Already on eliquis at home. ? For her PVD. Appreciate neurology consult -Cannot rule out seizure or vasovagal syncope -EEG ordered today. -Physical therapy, occupational therapy and speech therapy consult -Echocardiogram pending -Continue neuro checks and check orthostatics - needs BP control as well  #2 hypertension-on losartan and Norvasc  #3  hyperthyroidism-on methimazole  #4 mild dementia-on Aricept  #5 DVT prophylaxis-already on eliquis  Anticipated discharge tomorrow   All the records are reviewed and case discussed with Care Management/Social Workerr. Management plans discussed with the patient, family and they are in agreement.  CODE STATUS: DNR  TOTAL TIME TAKING CARE OF THIS PATIENT: 38 minutes.   POSSIBLE D/C TOMORROW, DEPENDING ON CLINICAL CONDITION.   Davelle Anselmi M.D on 01/28/2017 at 1:30 PM  Between 7am to 6pm - Pager - [phone]  After 6pm go to [email or web address] - password [email or web address] [facility]  Office  [phone]  CC: Primary care physician; Abelardo Diesel, NP (Inactive)

## 2017-01-29 LAB — BASIC METABOLIC PANEL
ANION GAP: 4 — AB (ref 5–15)
BUN: 15 mg/dL (ref 6–20)
CALCIUM: 8.5 mg/dL — AB (ref 8.9–10.3)
CO2: 25 mmol/L (ref 22–32)
CREATININE: 1.08 mg/dL — AB (ref 0.44–1.00)
Chloride: 111 mmol/L (ref 101–111)
GFR, EST AFRICAN AMERICAN: 50 mL/min — AB (ref >60)
GFR, EST NON AFRICAN AMERICAN: 44 mL/min — AB (ref >60)
Glucose, Bld: 88 mg/dL (ref 65–99)
Potassium: 4.3 mmol/L (ref 3.5–5.1)
Sodium: 140 mmol/L (ref 135–145)

## 2017-01-29 LAB — GLUCOSE, CAPILLARY: Glucose-Capillary: 95 mg/dL (ref 65–99)

## 2017-01-29 MED ORDER — DOCUSATE SODIUM 100 MG PO CAPS: 100.0000 mg | ORAL_CAPSULE | Freq: Two times a day (BID) | ORAL | 0 refills | Status: DC

## 2017-01-29 MED ORDER — HYDROCOD POLST-CPM POLST ER 10-8 MG/5ML PO SUER: 5.0000 mL | Freq: Two times a day (BID) | ORAL | 0 refills | Status: DC

## 2017-01-29 MED ORDER — HYDROCOD POLST-CPM POLST ER 10-8 MG/5ML PO SUER
5.0000 mL | Freq: Two times a day (BID) | ORAL | Status: DC
  Administered 2017-01-29: 5 mL via ORAL
  Filled 2017-01-29: qty 5

## 2017-01-29 MED ORDER — ATORVASTATIN CALCIUM 40 MG PO TABS: 40.0000 mg | ORAL_TABLET | Freq: Every day | ORAL | 2 refills | Status: DC

## 2017-01-29 MED ORDER — ACETAMINOPHEN 325 MG PO TABS: 650.0000 mg | ORAL_TABLET | Freq: Four times a day (QID) | ORAL | 0 refills | Status: DC | PRN

## 2017-01-29 NOTE — Evaluation (Signed)
Physical Therapy Evaluation Patient Details Name: Katelyn [name] MRN: [id number] DOB: 08/01/1923 Today's Date: 01/29/2017   History of Present Illness  83yo female who presented to ER after unresponsive episode with focal neuro deficits at initial presentation (NIHSS $); admitted for CVA work up. MRI negative for acute infarct/hemorrhage.  PMHx includes CVA, HTN, PVD, CKD. MRI negative for acute infarct.  Clinical Impression  Upon evaluation, patient alert and oriented; follows all commands and demonstrates fair insight/safety awareness.  Generally impulsive, limited receptiveness to cuing/education.  Bilat UE/LE strength and ROM grossly symmetrical and WFL; no focal weakness or neurological deficits noted.  Demonstrates ability to complete bed mobility with mod indep; sit/stand, basic transfers and gait (150') with RW, close sup.  Min cuing for walker position, overall safety, but no overt buckling or LOB.  Gait speed decreased, higher level balance deficits evident; recommend continued use of RW at all times to decrease fall risk.  Patient/daughter informed and aware. Would benefit from skilled PT to address above deficits and promote optimal return to PLOF; Recommend transition to [place] upon discharge from acute hospitalization.     Follow Up Recommendations Home health PT(continued participation with [facility])    Equipment Recommendations       Recommendations for Other Services       Precautions / Restrictions Precautions Precautions: Fall Restrictions Weight Bearing Restrictions: No      Mobility  Bed Mobility Overal bed mobility: Modified Independent             General bed mobility comments: use of bedrails to assist with truncal elevation  Transfers Overall transfer level: Needs assistance Equipment used: Rolling walker (2 wheeled) Transfers: Sit to/from Stand Sit to Stand: Min guard;Supervision         General transfer comment: cuing for hand  placement, tends to pull on RW  Ambulation/Gait Ambulation/Gait assistance: Supervision;Min guard Ambulation Distance (Feet): 150 Feet Assistive device: Rolling walker (2 wheeled)   Gait velocity: 10' walk time, 9-10 seconds Gait velocity interpretation: <1.8 ft/sec, indicative of risk for recurrent falls General Gait Details: forward flexed posture, inconsistent step height/length; mildly antalgic towards R.  Cuing for walker position and safety (tends to maintain arms length anterior to patient, steps away from when approaching seating surfaces).  Mild gait deviations with head turns; brisk cadence/gait speed, generally impulsive.  Stairs            Wheelchair Mobility    Modified Rankin (Stroke Patients Only)       Balance Overall balance assessment: Needs assistance Sitting-balance support: No upper extremity supported;Feet supported Sitting balance-Leahy Scale: Normal     Standing balance support: Bilateral upper extremity supported Standing balance-Leahy Scale: Fair                               Pertinent Vitals/Pain Pain Assessment: No/denies pain    Home Living Family/patient expects to be discharged to:: Private residence Living Arrangements: Children Available Help at Discharge: Family;Available 24 hours/day Type of Home: House Home Access: Ramped entrance;Stairs to enter Entrance Stairs-Rails: Right;Left Entrance Stairs-Number of Steps: 4 steps with R/L handrails from front entrance, has ramped entrance from [place]: One level Home Equipment: Tub bench;Cane - single point;Walker - 4 wheels;Bedside commode      Prior Function Level of Independence: Needs assistance   Gait / Transfers Assistance Needed: mod indep with rollator or SPC when she needs it, dtr in room reports she should  be using AD all the time  ADL's / Homemaking Assistance Needed: assist for tub transfers, washing back, fastening back clasp of bra, medication mgt,  cooking/cleaning, and driving; pt wears Depends to manage incontinence  Comments: 1 fall in past 12 months (per dtr, pt was going too fast without AD and fell)     Hand Dominance        Extremity/Trunk Assessment   Upper Extremity Assessment Upper Extremity Assessment: Overall WFL for tasks assessed    Lower Extremity Assessment Lower Extremity Assessment: Overall WFL for tasks assessed(grossly 4+/5 throughout, intact sensation and coordination; no focal deficits noted)    Cervical / Trunk Assessment Cervical / Trunk Assessment: Normal  Communication   Communication: HOH  Cognition Arousal/Alertness: Awake/alert Behavior During Therapy: WFL for tasks assessed/performed;Impulsive Overall Cognitive Status: Within Functional Limits for tasks assessed                                 General Comments: A&Ox4, follows all commands      General Comments      Exercises Other Exercises Other Exercises: Toilet transfer, ambulatory with RW, close sup; sit/stand from standard toilet with grab bar, close sup (multiple attempts, use of momentum).  Standing balance for hygiene, close sup with RW.  Hand hygiene at sink, close sup; min cuing for walker position/management when apporaching counter   Assessment/Plan    PT Assessment Patient needs continued PT services  PT Problem List Decreased strength;Decreased activity tolerance;Decreased balance;Decreased mobility;Decreased coordination;Decreased cognition;Decreased knowledge of use of DME;Decreased safety awareness;Decreased knowledge of precautions       PT Treatment Interventions DME instruction;Gait training;Functional mobility training;Therapeutic exercise;Balance training;Neuromuscular re-education;Therapeutic activities;Patient/family education    PT Goals (Current goals can be found in the Care Plan section)  Acute Rehab PT Goals Patient Stated Goal: go home PT Goal Formulation: With patient/family Time For  Goal Achievement: 02/12/17 Potential to Achieve Goals: Good    Frequency Min 2X/week   Barriers to discharge        Co-evaluation               AM-PAC PT "6 Clicks" Daily Activity  Outcome Measure Difficulty turning over in bed (including adjusting bedclothes, sheets and blankets)?: A Little Difficulty moving from lying on back to sitting on the side of the bed? : A Little Difficulty sitting down on and standing up from a chair with arms (e.g., wheelchair, bedside commode, etc,.)?: A Little Help needed moving to and from a bed to chair (including a wheelchair)?: A Little Help needed walking in hospital room?: A Little Help needed climbing 3-5 steps with a railing? : A Little 6 Click Score: 18    End of Session Equipment Utilized During Treatment: Gait belt Activity Tolerance: Patient tolerated treatment well Patient left: in bed;with call bell/phone within reach;with bed alarm set;with family/visitor present Nurse Communication: Mobility status PT Visit Diagnosis: Unsteadiness on feet (R26.81);Difficulty in walking, not elsewhere classified (R26.2)    Time: 6195-0932 PT Time Calculation (min) (ACUTE ONLY): 20 min   Charges:   PT Evaluation $PT Eval Low Complexity: 1 Low PT Treatments $Therapeutic Activity: 8-22 mins   PT G Codes:   PT G-Codes **NOT FOR INPATIENT CLASS** Functional Assessment Tool Used: AM-PAC 6 Clicks Basic Mobility Functional Limitation: Mobility: Walking and moving around Mobility: Walking and Moving Around Current Status (I7124): At least 20 percent but less than 40 percent impaired, limited or restricted Mobility: Walking  and Moving Around Goal Status [id number]): At least 1 percent but less than 20 percent impaired, limited or restricted    Zaelyn Noack H. Owens Shark, PT, DPT, NCS 01/29/17, 10:17 AM [phone]

## 2017-01-29 NOTE — Discharge Summary (Signed)
[facility] [place] NAME: Katelyn [name]    MR#:  [id number]  DATE OF BIRTH:  Feb 01, 1924  DATE OF ADMISSION:  01/27/2017   ADMITTING PHYSICIAN: Idelle Crouch, MD  DATE OF DISCHARGE: 01/29/17  PRIMARY CARE PHYSICIAN: Abelardo Diesel, NP (Inactive)   ADMISSION DIAGNOSIS:   Acute ischemic stroke (HCC) [I63.9] Altered mental status, unspecified altered mental status type [R41.82]  DISCHARGE DIAGNOSIS:   Principal Problem:   CVA (cerebral vascular accident) ([place]) Active Problems:   Essential (primary) hypertension   Hyperglycemia   PAD (peripheral artery disease) ([place])   Altered mental status   SECONDARY DIAGNOSIS:   Past Medical History:  Diagnosis Date  . Abdominal pain   . Anxiety   . Atherosclerosis   . Chronic kidney disease    stage 3  . Colon polyps   . Constipation   . Cough with expectoration   . Dependent edema bilateral legs  . Gait abnormality   . Gout   . Hearing loss   . Hearing loss   . Hemorrhoids   . Hypertension   . Incontinence   . Osteoarthritis   . Osteoarthritis   . Osteoarthritis   . Paget's disease of bone   . Peripheral vascular disease ([place])   . Pneumonia   . Proteinuria   . Stroke [facility])     HOSPITAL COURSE:   83 year old female with past medical history CVA, hypertension, peripheral vascular disease, CKD, mild dementia was brought in by family secondary to unresponsive episode.  #1 unresponsiveness-could be TIA, - MRI of the brain did not reveal any acute findings though has chronic left occipitoparietal infarct. -CT angiogram showing moderate to severe right MCA stenosis -Already on eliquis at home. ? For her PVD. Appreciate neurology consult -Could be vasovagal syncope, no significant positive orthostatic findings this morning. -EG negative for any epileptic activity -Appreciate speech therapy, occupational therapy consults. No outpatient needs identified. Physical therapy  recommended outpatient PT. -Echocardiogram normal ejection fraction and no cardiac source of emboli  #2 hypertension-on losartan and Norvasc  #3 hyperthyroidism-on methimazole  #4 mild dementia-on Aricept   Will be discharged back home today with [facility]    DISCHARGE CONDITIONS:   Guarded  CONSULTS OBTAINED:   Treatment Team:  Alexis Goodell, MD [place], Mariann Barter, MD  DRUG ALLERGIES:   Allergies  Allergen Reactions  . Bee Venom Anaphylaxis   DISCHARGE MEDICATIONS:   Allergies as of 01/29/2017      Reactions   Bee Venom Anaphylaxis      Medication List    STOP taking these medications   acetaminophen 650 MG CR tablet Commonly known as:  TYLENOL Replaced by:  acetaminophen 325 MG tablet   pravastatin 20 MG tablet Commonly known as:  PRAVACHOL     TAKE these medications   acetaminophen 325 MG tablet Commonly known as:  TYLENOL Take 2 tablets (650 mg total) by mouth every 6 (six) hours as needed for mild pain (or Fever >/= 101). Replaces:  acetaminophen 650 MG CR tablet   amLODipine 5 MG tablet Commonly known as:  NORVASC Take 1 tablet (5 mg total) by mouth daily.   apixaban 2.5 MG Tabs tablet Commonly known as:  ELIQUIS Take 1 tablet (2.5 mg total) by mouth 2 (two) times daily.   atorvastatin 40 MG tablet Commonly known as:  LIPITOR Take 1 tablet (40 mg total) by mouth daily at 6 PM.   cetirizine 5 MG tablet Commonly  known as:  ZYRTEC Take 5 mg at bedtime by mouth.   chlorpheniramine-HYDROcodone 10-8 MG/5ML Suer Commonly known as:  TUSSIONEX Take 5 mLs by mouth every 12 (twelve) hours. X 10 days and stop   docusate sodium 100 MG capsule Commonly known as:  COLACE Take 1 capsule (100 mg total) by mouth 2 (two) times daily.   donepezil 10 MG tablet Commonly known as:  ARICEPT Take 10 mg daily by mouth.   fluticasone 50 MCG/ACT nasal spray Commonly known as:  FLONASE Place 2 sprays into both nostrils daily.   hydrocortisone 2.5  % rectal cream Commonly known as:  ANUSOL-HC Place 1 application rectally 2 (two) times daily as needed for hemorrhoids.   losartan 50 MG tablet Commonly known as:  COZAAR Take 50 mg by mouth daily.   methimazole 10 MG tablet Commonly known as:  TAPAZOLE Take 5 mg 2 (two) times daily by mouth.   polyethylene glycol packet Commonly known as:  MIRALAX / GLYCOLAX Take 17 g by mouth daily.   sennosides-docusate sodium 8.6-50 MG tablet Commonly known as:  SENOKOT-S Take 1 tablet 2 (two) times daily as needed by mouth for constipation.        DISCHARGE INSTRUCTIONS:   1. PCP f/u in 1-2 weeks 2. Physical Therapy  DIET:   Cardiac diet  ACTIVITY:   Activity as tolerated  OXYGEN:   Home Oxygen: No.  Oxygen Delivery: room air  DISCHARGE LOCATION:   home   If you experience worsening of your admission symptoms, develop shortness of breath, life threatening emergency, suicidal or homicidal thoughts you must seek medical attention immediately by calling 911 or calling your MD immediately  if symptoms less severe.  You Must read complete instructions/literature along with all the possible adverse reactions/side effects for all the Medicines you take and that have been prescribed to you. Take any new Medicines after you have completely understood and accpet all the possible adverse reactions/side effects.   Please note  You were cared for by a hospitalist during your hospital stay. If you have any questions about your discharge medications or the care you received while you were in the hospital after you are discharged, you can call the unit and asked to speak with the hospitalist on call if the hospitalist that took care of you is not available. Once you are discharged, your primary care physician will handle any further medical issues. Please note that NO REFILLS for any discharge medications will be authorized once you are discharged, as it is imperative that you return to your  primary care physician (or establish a relationship with a primary care physician if you do not have one) for your aftercare needs so that they can reassess your need for medications and monitor your lab values.    On the day of Discharge:  VITAL SIGNS:   Blood pressure (!) 163/70, pulse 80, temperature 97.8 F (36.6 C), resp. rate 15, height 5\' 4"  (1.626 m), weight 75.8 kg (167 lb), SpO2 98 %.  PHYSICAL EXAMINATION:    GENERAL:  83 y.o.-year-old elderly patient lying in the bed with no acute distress.  EYES: Pupils equal, round, reactive to light and accommodation. No scleral icterus. Extraocular muscles intact.  HEENT: Head atraumatic, normocephalic. Oropharynx and nasopharynx clear.  NECK:  Supple, no jugular venous distention. No thyroid enlargement, no tenderness.  LUNGS: Normal breath sounds bilaterally, no wheezing, rales,rhonchi or crepitation. No use of accessory muscles of respiration. Decreased bibasilar breath sounds CARDIOVASCULAR: S1, S2  normal. No murmurs, rubs, or gallops.  ABDOMEN: Soft, nontender, nondistended. Bowel sounds present. No organomegaly or mass.  EXTREMITIES: No pedal edema, cyanosis, or clubbing.  NEUROLOGIC: Cranial nerves II through XII are intact. Speech is clear. Muscle strength 5/5 in all extremities. Following commands. Sensation intact. Gait not checked.  PSYCHIATRIC: The patient is alert and oriented x 3.  SKIN: No obvious rash, lesion, or ulcer.     DATA REVIEW:   CBC Recent Labs  Lab 01/28/17 0426  WBC 5.8  HGB 11.2*  HCT 33.6*  PLT 180    Chemistries  Recent Labs  Lab 01/28/17 0426 01/29/17 0405  NA 139 140  K 3.7 4.3  CL 108 111  CO2 23 25  GLUCOSE 87 88  BUN 16 15  CREATININE 1.00 1.08*  CALCIUM 8.5* 8.5*  AST 16  --   ALT 8*  --   ALKPHOS 53  --   BILITOT 0.5  --      Microbiology Results  Results for orders placed or performed during the hospital encounter of 10/13/14  Urine culture     Status: None    Collection Time: 10/13/14  2:15 PM  Result Value Ref Range Status   Specimen Description URINE, CLEAN CATCH  Final   Special Requests NONE  Final   Culture INSIGNIFICANT GROWTH  Final   Report Status 10/14/2014 FINAL  Final    RADIOLOGY:  No results found.   Management plans discussed with the patient, family and they are in agreement.  CODE STATUS:     Code Status Orders  (From admission, onward)        Start     Ordered   01/27/17 1144  Do not attempt resuscitation (DNR)  Continuous    Question Answer Comment  In the event of cardiac or respiratory ARREST Do not call a "code blue"   In the event of cardiac or respiratory ARREST Do not perform Intubation, CPR, defibrillation or ACLS   In the event of cardiac or respiratory ARREST Use medication by any route, position, wound care, and other measures to relive pain and suffering. May use oxygen, suction and manual treatment of airway obstruction as needed for comfort.      01/27/17 1143    Code Status History    Date Active Date Inactive Code Status Order ID Comments User Context   01/13/2015 12:02 01/13/2015 16:49 Full Code [id number]  Algernon Huxley, MD Inpatient   12/23/2014 14:12 12/27/2014 21:42 Full Code [id number]  Aldean Jewett, MD Inpatient      TOTAL TIME TAKING CARE OF THIS PATIENT: 38 minutes.    Gladstone Lighter M.D on 01/29/2017 at 11:14 AM  Between 7am to 6pm - Pager - [phone]  After 6pm go to [email or web address] - Proofreader  [facility] [place] [facility]  Office  [phone]  CC: Primary care physician; Abelardo Diesel, NP (Inactive)   Note: This dictation was prepared with Dragon dictation along with smaller phrase technology. Any transcriptional errors that result from this process are unintentional.

## 2017-01-29 NOTE — Progress Notes (Signed)
Pt worked with PT and ambulated around the nurse's station using a walker.  She will be followed by [facility] and will be living with her daughter. MRI showed old stroke but nothing acute, but patient's only deficit is some slurred speech.  IVs removed and d/c paperwork reviewed.  [facility] will transport her home.

## 2017-01-29 NOTE — Evaluation (Signed)
Occupational Therapy Evaluation Patient Details Name: Katelyn [name] MRN: [id number] DOB: Mar 07, 1924 Today's Date: 01/29/2017    History of Present Illness 83yo female pt admitted for CVA work up. PMHx includes CVA, HTN, PVD, CKD. MRI negative for acute infarct.   Clinical Impression   Pt seen for OT evaluation this date. Pt is a pleasant 83yo female, alert and oriented, able to follow all commands. Pt grossly at baseline functional independence with daughter in agreement. Pt with no difficulty with dressing, toileting, and functional mobility tasks upon assessment. No further skilled OT needs identified. Recommend transition home with continued [facility] program therapy as appropriate to maximize pt's functional independence and minimize risk of functional decline, falls, and increased caregiver burden. Please re-consult if additional OT needs arise.     Follow Up Recommendations  Supervision/Assistance - 24 hour;No OT follow up(continue with [facility] program therapy as needed)    Equipment Recommendations  None recommended by OT    Recommendations for Other Services       Precautions / Restrictions Precautions Precautions: Fall Restrictions Weight Bearing Restrictions: No      Mobility Bed Mobility Overal bed mobility: Independent             General bed mobility comments: good effort and safety awareness  Transfers Overall transfer level: Needs assistance Equipment used: Rolling walker (2 wheeled) Transfers: Sit to/from Stand Sit to Stand: Supervision         General transfer comment: good effort, no LOB    Balance Overall balance assessment: Needs assistance Sitting-balance support: No upper extremity supported;Feet supported Sitting balance-Leahy Scale: Normal     Standing balance support: Bilateral upper extremity supported Standing balance-Leahy Scale: Good                             ADL either performed or assessed with clinical  judgement   ADL Overall ADL's : At baseline                                             Vision Baseline Vision/History: Wears glasses Wears Glasses: At all times Patient Visual Report: No change from baseline Vision Assessment?: No apparent visual deficits     Perception     Praxis      Pertinent Vitals/Pain Pain Assessment: No/denies pain     Hand Dominance     Extremity/Trunk Assessment Upper Extremity Assessment Upper Extremity Assessment: Overall WFL for tasks assessed(grossly [facility] for age 38+/5 bilaterally, intact sensation)   Lower Extremity Assessment Lower Extremity Assessment: Defer to PT evaluation;Overall WFL for tasks assessed(grossly [facility] for age)   Cervical / Trunk Assessment Cervical / Trunk Assessment: Normal   Communication Communication Communication: HOH   Cognition Arousal/Alertness: Awake/alert Behavior During Therapy: WFL for tasks assessed/performed Overall Cognitive Status: Within Functional Limits for tasks assessed                                 General Comments: A&Ox4, follows all commands   General Comments       Exercises     Shoulder Instructions      Home Living Family/patient expects to be discharged to:: Private residence Living Arrangements: Children(daughter) Available Help at Discharge: Family;Available 24 hours/day Type of Home: House Home Access: Ramped entrance;Stairs to enter  Entrance Stairs-Number of Steps: 4 steps with R/L handrails from front entrance, has ramped entrance from [place] Entrance Stairs-Rails: Right;Left Home Layout: One level     Bathroom Shower/Tub: Tub/shower unit   Bathroom Toilet: Standard(has BSC in her room she primarily uses)     Home Equipment: Tub bench;Cane - single point;Walker - 4 wheels;Bedside commode          Prior Functioning/Environment Level of Independence: Needs assistance  Gait / Transfers Assistance Needed: mod indep with rollator or  SPC when she needs it, dtr in room reports she should be using AD all the time ADL's / Homemaking Assistance Needed: assist for tub transfers, washing back, fastening back clasp of bra, medication mgt, cooking/cleaning, and driving; pt wears Depends to manage incontinence   Comments: 1 fall in past 12 months (per dtr, pt was going too fast without AD and fell)        OT Problem List:        OT Treatment/Interventions:      OT Goals(Current goals can be found in the care plan section) Acute Rehab OT Goals Patient Stated Goal: go home OT Goal Formulation: All assessment and education complete, DC therapy  OT Frequency:     Barriers to D/C:            Co-evaluation              AM-PAC PT "6 Clicks" Daily Activity     Outcome Measure Help from another person eating meals?: None Help from another person taking care of personal grooming?: None Help from another person toileting, which includes using toliet, bedpan, or urinal?: A Little Help from another person bathing (including washing, rinsing, drying)?: A Little Help from another person to put on and taking off regular upper body clothing?: A Little Help from another person to put on and taking off regular lower body clothing?: A Little 6 Click Score: 20   End of Session Equipment Utilized During Treatment: Gait belt;Rolling walker  Activity Tolerance: Patient tolerated treatment well Patient left: in bed;with call bell/phone within reach;with bed alarm set;with family/visitor present  OT Visit Diagnosis: Other abnormalities of gait and mobility (R26.89)                Time: 6468-0321 OT Time Calculation (min): 18 min Charges:  OT General Charges $OT Visit: 1 Visit OT Evaluation $OT Eval Low Complexity: 1 Low G-Codes: OT G-codes **NOT FOR INPATIENT CLASS** Functional Assessment Tool Used: AM-PAC 6 Clicks Daily Activity;Clinical judgement Functional Limitation: Self care Self Care Current Status (Y2482): At least 1  percent but less than 20 percent impaired, limited or restricted Self Care Goal Status (N0037): At least 1 percent but less than 20 percent impaired, limited or restricted Self Care Discharge Status [phone]): At least 1 percent but less than 20 percent impaired, limited or restricted   Jeni Salles, MPH, MS, OTR/L ascom [phone] 01/29/17, 9:37 AM

## 2017-01-29 NOTE — Care Management Important Message (Signed)
Important Message  Patient Details  Name: Katelyn [name] MRN: [id number] Date of Birth: [birth year]   Medicare Important Message Given:  Yes    [name], RN 01/29/2017, 8:08 AM

## 2017-01-29 NOTE — Plan of Care (Signed)
  Progressing Ischemic Stroke/TIA Tissue Perfusion: Complications of ischemic stroke/TIA will be minimized 01/29/2017 0254 - Progressing by Sonda Primes, RN

## 2017-01-29 NOTE — Care Management (Signed)
Discharge to home today per Dr. Tressia Miners. Spoke with [facility] [employer]. States that the transport person would be at the hospital about noon to pick Katelyn [name] up. Family members are in agreement with this plan [name] RN MSN CCM Care Management [phone]

## 2017-02-02 LAB — BLOOD GAS, VENOUS
Acid-Base Excess: 0.7 mmol/L (ref 0.0–2.0)
Bicarbonate: 28.9 mmol/L — ABNORMAL HIGH (ref 20.0–28.0)
PATIENT TEMPERATURE: 37
PCO2 VEN: 63 mmHg — AB (ref 44.0–60.0)
PH VEN: 7.27 (ref 7.250–7.430)

## 2017-06-04 ENCOUNTER — Ambulatory Visit (INDEPENDENT_AMBULATORY_CARE_PROVIDER_SITE_OTHER): Payer: Medicare (Managed Care) | Admitting: Vascular Surgery

## 2017-06-04 ENCOUNTER — Ambulatory Visit (INDEPENDENT_AMBULATORY_CARE_PROVIDER_SITE_OTHER): Payer: No Typology Code available for payment source

## 2017-06-04 ENCOUNTER — Encounter (INDEPENDENT_AMBULATORY_CARE_PROVIDER_SITE_OTHER): Payer: Self-pay | Admitting: Vascular Surgery

## 2017-06-04 VITALS — BP 163/67 | HR 64 | Resp 16 | Ht 64.0 in | Wt 159.0 lb

## 2017-06-04 DIAGNOSIS — I10 Essential (primary) hypertension: Secondary | ICD-10-CM | POA: Diagnosis not present

## 2017-06-04 DIAGNOSIS — I739 Peripheral vascular disease, unspecified: Secondary | ICD-10-CM

## 2017-06-04 DIAGNOSIS — N183 Chronic kidney disease, stage 3 unspecified: Secondary | ICD-10-CM

## 2017-06-04 NOTE — Progress Notes (Signed)
MRN : [id number]  Katelyn [name] is a 84 y.o. ([birth year]) female who presents with chief complaint of  Chief Complaint  Patient presents with  . Follow-up    67month abi  .  History of Present Illness: Patient returns today in follow up of peripheral arterial disease.  She is a couple of years status post right lower extremity intervention.  She has no current lower extremity rest pain, ulceration, or infection.  She complains of left flank and hip pain and asks if she can have some pain medicine.  Her perfusion today is basically normal with ABIs of 1.06 on the right and 1.13 on the left with fairly good waveforms.  Current Outpatient Prescriptions  Medication Sig Dispense Refill  . amLODipine (NORVASC) 5 MG tablet Take 1 tablet (5 mg total) by mouth daily. 30 tablet 0  . apixaban (ELIQUIS) 2.5 MG TABS tablet Take 1 tablet (2.5 mg total) by mouth 2 (two) times daily. 60 tablet 3  . aspirin 81 MG chewable tablet Chew 1 tablet (81 mg total) by mouth daily.    . fluticasone (FLONASE) 50 MCG/ACT nasal spray Place 2 sprays into both nostrils daily. 16 g 5  . hydrocortisone (ANUSOL-HC) 2.5 % rectal cream Place 1 application rectally 2 (two) times daily as needed for hemorrhoids.    Marland Kitchen losartan (COZAAR) 50 MG tablet Take 50 mg by mouth daily.    . metoprolol tartrate (LOPRESSOR) 25 MG tablet Take 0.5 tablets (12.5 mg total) by mouth 2 (two) times daily. 30 tablet 0  . polyethylene glycol (MIRALAX / GLYCOLAX) packet Take 17 g by mouth daily.    . pravastatin (PRAVACHOL) 20 MG tablet Take 1 tablet (20 mg total) by mouth daily at 6 PM. 30 tablet 0   No current facility-administered medications for this visit.         Past Medical History:  Diagnosis Date  . Abdominal pain   . Anxiety   . Atherosclerosis   . Chronic kidney disease    stage 3  . Colon polyps   . Constipation   . Cough with expectoration   . Dependent edema bilateral legs  . Gait abnormality     . Gout   . Hearing loss   . Hearing loss   . Hemorrhoids   . Hypertension   . Incontinence   . Osteoarthritis   . Osteoarthritis   . Osteoarthritis   . Paget's disease of bone   . Peripheral vascular disease ([place])   . Pneumonia   . Proteinuria          Past Surgical History:  Procedure Laterality Date  . PARTIAL HYSTERECTOMY    . PERIPHERAL VASCULAR CATHETERIZATION N/A 01/13/2015   Procedure: Abdominal Aortogram w/Lower Extremity;  Surgeon: Algernon Huxley, MD;  Location: [facility];  Service: Cardiovascular;  Laterality: N/A;  . PERIPHERAL VASCULAR CATHETERIZATION  01/13/2015   Procedure: Lower Extremity Intervention;  Surgeon: Algernon Huxley, MD;  Location: [facility];  Service: Cardiovascular;;    Social History     Social History  Substance Use Topics  . Smoking status: Never Smoker  . Smokeless tobacco: Never Used  . Alcohol use No      Family History      Family History  Problem Relation Age of Onset  . Diabetes Mother   . Cancer Sister   . Cancer Sister   . Cancer Sister          Allergies  Allergen Reactions  . Bee Venom Anaphylaxis     REVIEW OF SYSTEMS (Negative unless checked)  Constitutional: [] Weight loss  [] Fever  [] Chills Cardiac: [] Chest pain   [] Chest pressure   [] Palpitations   [] Shortness of breath when laying flat   [] Shortness of breath at rest   [] Shortness of breath with exertion. Vascular:  [] Pain in legs with walking   [] Pain in legs at rest   [] Pain in legs when laying flat   [] Claudication   [] Pain in feet when walking  [] Pain in feet at rest  [] Pain in feet when laying flat   [] History of DVT   [] Phlebitis   [x] Swelling in legs   [] Varicose veins   [] Non-healing ulcers Pulmonary:   [] Uses home oxygen   [] Productive cough   [] Hemoptysis   [] Wheeze  [] COPD   [] Asthma Neurologic:  [] Dizziness  [] Blackouts   [] Seizures   [] History of stroke   [] History of TIA  [] Aphasia   [] Temporary  blindness   [] Dysphagia   [] Weakness or numbness in arms   [] Weakness or numbness in legs Musculoskeletal:  [x] Arthritis   [] Joint swelling   [] Joint pain   [] Low back pain Hematologic:  [] Easy bruising  [] Easy bleeding   [] Hypercoagulable state   [] Anemic   Gastrointestinal:  [] Blood in stool   [] Vomiting blood  [] Gastroesophageal reflux/heartburn   [] Abdominal pain Genitourinary:  [] Chronic kidney disease   [] Difficult urination  [] Frequent urination  [] Burning with urination   [] Hematuria Skin:  [] Rashes   [] Ulcers   [] Wounds Psychological:  [] History of anxiety   []  History of major depression.        Physical Examination  BP (!) 163/67 (BP Location: Right Arm)   Pulse 64   Resp 16   Ht 5\' 4"  (1.626 m)   Wt 159 lb (72.1 kg)   BMI 27.29 kg/m  Gen:  WD/WN, NAD.  Appears younger than stated age Head: [place]/AT, No temporalis wasting. Ear/Nose/Throat: Hearing grossly intact, nares w/o erythema or drainage, trachea midline Eyes: Conjunctiva clear. Sclera non-icteric Neck: Supple.  No JVD.  Pulmonary:  Good air movement, no use of accessory muscles.  Cardiac: Irregular Vascular:  Vessel Right Left  Radial Palpable Palpable                          PT  1+ palpable  1+ palpable  DP  1+ palpable Palpable    Musculoskeletal: M/S 5/5 throughout.  No deformity or atrophy.  Walks with a walker.  Mild lower extremity edema. Neurologic: Sensation grossly intact in extremities.  Symmetrical.  Speech is fluent.  Psychiatric: Judgment intact, Mood & affect appropriate for pt's clinical situation. Dermatologic: No rashes or ulcers noted.  No cellulitis or open wounds.       Labs No results found for this or any previous visit (from the past 2160 hour(s)).  Radiology No results found.    Assessment/Plan  Essential (primary) hypertension blood pressure control important in reducing the progression of atherosclerotic disease. On appropriate oral medications.  CKD (chronic  kidney disease) stage 3, GFR 30-59 ml/min Would avoid contrast unless limb threatening symptoms develop  PAD (peripheral artery disease) (HCC) Her perfusion today is basically normal with ABIs of 1.06 on the right and 1.13 on the left with fairly good waveforms. No role for intervention.  Continue current medical regimen which includes an anticoagulant and a statin agent.  Return in 1 year with noninvasive studies.    Leotis Pain,  MD  06/04/2017 3:38 PM    This note was created with Dragon medical transcription system.  Any errors from dictation are purely unintentional

## 2017-06-04 NOTE — Assessment & Plan Note (Signed)
Her perfusion today is basically normal with ABIs of 1.06 on the right and 1.13 on the left with fairly good waveforms. No role for intervention.  Continue current medical regimen which includes an anticoagulant and a statin agent.  Return in 1 year with noninvasive studies.

## 2017-06-04 NOTE — Patient Instructions (Signed)

## 2017-06-04 NOTE — Assessment & Plan Note (Signed)
Would avoid contrast unless limb threatening symptoms develop

## 2017-12-20 ENCOUNTER — Other Ambulatory Visit: Payer: Self-pay

## 2017-12-20 ENCOUNTER — Encounter: Payer: Self-pay | Admitting: Emergency Medicine

## 2017-12-20 ENCOUNTER — Emergency Department: Payer: No Typology Code available for payment source

## 2017-12-20 ENCOUNTER — Emergency Department
Admission: EM | Admit: 2017-12-20 | Discharge: 2017-12-20 | Disposition: A | Discharge status: Home / Self Care | Payer: No Typology Code available for payment source | Attending: Emergency Medicine | Admitting: Emergency Medicine

## 2017-12-20 DIAGNOSIS — Z79899 Other long term (current) drug therapy: Secondary | ICD-10-CM | POA: Diagnosis not present

## 2017-12-20 DIAGNOSIS — K5900 Constipation, unspecified: Secondary | ICD-10-CM | POA: Insufficient documentation

## 2017-12-20 DIAGNOSIS — Z7901 Long term (current) use of anticoagulants: Secondary | ICD-10-CM | POA: Diagnosis not present

## 2017-12-20 DIAGNOSIS — I129 Hypertensive chronic kidney disease with stage 1 through stage 4 chronic kidney disease, or unspecified chronic kidney disease: Secondary | ICD-10-CM | POA: Diagnosis not present

## 2017-12-20 DIAGNOSIS — N183 Chronic kidney disease, stage 3 (moderate): Secondary | ICD-10-CM | POA: Insufficient documentation

## 2017-12-20 DIAGNOSIS — R55 Syncope and collapse: Secondary | ICD-10-CM | POA: Diagnosis present

## 2017-12-20 LAB — CBC WITH DIFFERENTIAL/PLATELET
ABS IMMATURE GRANULOCYTES: 0.03 10*3/uL (ref 0.00–0.07)
BASOS ABS: 0 10*3/uL (ref 0.0–0.1)
BASOS PCT: 0 %
Eosinophils Absolute: 0.1 10*3/uL (ref 0.0–0.5)
Eosinophils Relative: 2 %
HCT: 33.9 % — ABNORMAL LOW (ref 36.0–46.0)
Hemoglobin: 10.8 g/dL — ABNORMAL LOW (ref 12.0–15.0)
IMMATURE GRANULOCYTES: 0 %
LYMPHS ABS: 3 10*3/uL (ref 0.7–4.0)
Lymphocytes Relative: 34 %
MCH: 31.4 pg (ref 26.0–34.0)
MCHC: 31.9 g/dL (ref 30.0–36.0)
MCV: 98.5 fL (ref 80.0–100.0)
MONOS PCT: 5 %
Monocytes Absolute: 0.4 10*3/uL (ref 0.1–1.0)
NEUTROS ABS: 5.3 10*3/uL (ref 1.7–7.7)
NEUTROS PCT: 59 %
Platelets: 248 10*3/uL (ref 150–400)
RBC: 3.44 MIL/uL — ABNORMAL LOW (ref 3.87–5.11)
RDW: 14.6 % (ref 11.5–15.5)
WBC: 8.9 10*3/uL (ref 4.0–10.5)
nRBC: 0 % (ref 0.0–0.2)

## 2017-12-20 LAB — COMPREHENSIVE METABOLIC PANEL
ALBUMIN: 3.6 g/dL (ref 3.5–5.0)
ALK PHOS: 50 U/L (ref 38–126)
ALT: 7 U/L (ref 0–44)
AST: 16 U/L (ref 15–41)
Anion gap: 8 (ref 5–15)
BILIRUBIN TOTAL: 0.5 mg/dL (ref 0.3–1.2)
BUN: 20 mg/dL (ref 8–23)
CHLORIDE: 106 mmol/L (ref 98–111)
CO2: 25 mmol/L (ref 22–32)
Calcium: 9.1 mg/dL (ref 8.9–10.3)
Creatinine, Ser: 1.03 mg/dL — ABNORMAL HIGH (ref 0.44–1.00)
GFR calc Af Amer: 53 mL/min — ABNORMAL LOW (ref >60)
GFR calc non Af Amer: 46 mL/min — ABNORMAL LOW (ref >60)
GLUCOSE: 166 mg/dL — AB (ref 70–99)
POTASSIUM: 3.8 mmol/L (ref 3.5–5.1)
Sodium: 139 mmol/L (ref 135–145)
Total Protein: 6.9 g/dL (ref 6.5–8.1)

## 2017-12-20 LAB — TROPONIN I: TROPONIN I: 0.03 ng/mL — AB (ref <0.03)

## 2017-12-20 MED ORDER — DOCUSATE SODIUM 50 MG/5ML PO LIQD
100.0000 mg | Freq: Once | ORAL | Status: AC
  Administered 2017-12-20: 100 mg via ORAL
  Filled 2017-12-20 (×2): qty 10

## 2017-12-20 MED ORDER — MAGNESIUM CITRATE PO SOLN
1.0000 | Freq: Once | ORAL | Status: AC
  Administered 2017-12-20: 1 via ORAL
  Filled 2017-12-20: qty 296

## 2017-12-20 MED ORDER — LIDOCAINE HCL URETHRAL/MUCOSAL 2 % EX GEL
1.0000 "application " | Freq: Once | CUTANEOUS | Status: AC
  Administered 2017-12-20: 1 via TOPICAL
  Filled 2017-12-20: qty 10

## 2017-12-20 MED ORDER — LACTULOSE 10 GM/15ML PO SOLN
10.0000 g | Freq: Once | ORAL | Status: AC
  Administered 2017-12-20: 10 g via ORAL
  Filled 2017-12-20: qty 30

## 2017-12-20 MED ORDER — FENTANYL CITRATE (PF) 100 MCG/2ML IJ SOLN
50.0000 ug | Freq: Once | INTRAMUSCULAR | Status: AC
Start: 2017-12-20 — End: 2017-12-20
  Administered 2017-12-20: 50 ug via INTRAVENOUS
  Filled 2017-12-20: qty 2

## 2017-12-20 MED ORDER — SODIUM CHLORIDE 0.9 % IV BOLUS
500.0000 mL | Freq: Once | INTRAVENOUS | Status: AC
Start: 2017-12-20 — End: 2017-12-20
  Administered 2017-12-20: 500 mL via INTRAVENOUS

## 2017-12-20 NOTE — ED Notes (Signed)
Pt and family verbalized discharge teaching and verbalized understanding using the Teach Back Method. Pt BP verbalized to MD and family and family and doctor okay with being discharged.

## 2017-12-20 NOTE — ED Triage Notes (Signed)
EMS pt to Rm 26 from home with report of constipation for several days. Pt went to bathroom to have BM and passed out on the toilet.EMS reports  BP 189/90 HR low 60. FSBS 114. Afib on 12 lead EKG by EMS.

## 2017-12-20 NOTE — ED Notes (Signed)
Pt to CT

## 2017-12-20 NOTE — Discharge Instructions (Addendum)
Please increase the fiber in your diet and make sure you remain well hydrated to help prevent this severe constipation in the future.  It was a pleasure to take care of you today, and thank you for coming to our emergency department.  If you have any questions or concerns before leaving please ask the nurse to grab me and I'm more than happy to go through your aftercare instructions again.  The two most common reasons people get constipated are dehydration and low fiber diets.  Please make sure you drink enough water so that your urine is a light yellow color and purchase over the counter metamucil (or other fiber supplement) and take at least two scoops a day.  Most laxatives are over the counter.  Begin with colace 100mg  by mouth twice a day Add on dulcolax 10mg  by mouth 1-2 times a day as needed (this may cause some cramping) Drink a bottle of magnesium citrate once daily only for severe symptoms   If you have any concerns once you are home that you are not improving or are in fact getting worse before you can make it to your follow-up appointment, please do not hesitate to call [phone] and come back for further evaluation.  Darel Hong, MD  Results for orders placed or performed during the hospital encounter of 12/20/17  Comprehensive metabolic panel  Result Value Ref Range   Sodium 139 135 - 145 mmol/L   Potassium 3.8 3.5 - 5.1 mmol/L   Chloride 106 98 - 111 mmol/L   CO2 25 22 - 32 mmol/L   Glucose, Bld 166 (H) 70 - 99 mg/dL   BUN 20 8 - 23 mg/dL   Creatinine, Ser 1.03 (H) 0.44 - 1.00 mg/dL   Calcium 9.1 8.9 - 10.3 mg/dL   Total Protein 6.9 6.5 - 8.1 g/dL   Albumin 3.6 3.5 - 5.0 g/dL   AST 16 15 - 41 U/L   ALT 7 0 - 44 U/L   Alkaline Phosphatase 50 38 - 126 U/L   Total Bilirubin 0.5 0.3 - 1.2 mg/dL   GFR calc non Af Amer 46 (L) >60 mL/min   GFR calc Af Amer 53 (L) >60 mL/min   Anion gap 8 5 - 15  Troponin I  Result Value Ref Range   Troponin I 0.03 (HH) <0.03 ng/mL  CBC  with Differential  Result Value Ref Range   WBC 8.9 4.0 - 10.5 K/uL   RBC 3.44 (L) 3.87 - 5.11 MIL/uL   Hemoglobin 10.8 (L) 12.0 - 15.0 g/dL   HCT 33.9 (L) 36.0 - 46.0 %   MCV 98.5 80.0 - 100.0 fL   MCH 31.4 26.0 - 34.0 pg   MCHC 31.9 30.0 - 36.0 g/dL   RDW 14.6 11.5 - 15.5 %   Platelets 248 150 - 400 K/uL   nRBC 0.0 0.0 - 0.2 %   Neutrophils Relative % 59 %   Neutro Abs 5.3 1.7 - 7.7 K/uL   Lymphocytes Relative 34 %   Lymphs Abs 3.0 0.7 - 4.0 K/uL   Monocytes Relative 5 %   Monocytes Absolute 0.4 0.1 - 1.0 K/uL   Eosinophils Relative 2 %   Eosinophils Absolute 0.1 0.0 - 0.5 K/uL   Basophils Relative 0 %   Basophils Absolute 0.0 0.0 - 0.1 K/uL   Immature Granulocytes 0 %   Abs Immature Granulocytes 0.03 0.00 - 0.07 K/uL   Ct Abdomen Pelvis Wo Contrast  Result Date: 12/20/2017 CLINICAL DATA:  Constipation and syncope. EXAM: CT ABDOMEN AND PELVIS WITHOUT CONTRAST TECHNIQUE: Multidetector CT imaging of the abdomen and pelvis was performed following the standard protocol without IV contrast. COMPARISON:  CT abdomen pelvis 02/26/2014 FINDINGS: LOWER CHEST: Cardiomegaly with trace pericardial effusion. Bibasilar atelectasis. HEPATOBILIARY: The hepatic contours and density are normal. There is no intra- or extrahepatic biliary dilatation. Large, peripherally calcified stone in the gallbladder. No biliary dilatation. PANCREAS: The pancreatic parenchymal contours are normal and there is no ductal dilatation. There is no peripancreatic fluid collection. SPLEEN: Normal. ADRENALS/URINARY TRACT: --Adrenal glands: Normal. --Right kidney/ureter: Large calculus at the lower pole measures 16 mm. No hydronephrosis. --Left kidney/ureter: 3 mm interpolar calculus.  No hydronephrosis. --Urinary bladder: Normal for degree of distention STOMACH/BOWEL: --Stomach/Duodenum: There is no hiatal hernia or other gastric abnormality. The duodenal course and caliber are normal. --Small bowel: No dilatation or  inflammation. --Colon: Moderate amount of stool in the rectum. There is descending colonic diverticulosis without acute inflammation. --Appendix: Not visualized. No right lower quadrant inflammation or free fluid. VASCULAR/LYMPHATIC: Atherosclerotic calcification is present within the non-aneurysmal abdominal aorta, without hemodynamically significant stenosis. No abdominal or pelvic lymphadenopathy. REPRODUCTIVE: Status post hysterectomy. No adnexal mass. MUSCULOSKELETAL. Grade 1 anterolisthesis at L4-L5 due to facet arthrosis. OTHER: None IMPRESSION: 1. No acute abnormality of the abdomen or pelvis. 2. Moderate amount of stool in the rectum without dilated colon or small bowel. Descending colonic diverticulosis without acute inflammation. 3. Cardiomegaly, trace pericardial effusion and aortic atherosclerosis (ICD10-I70.0). 4. Bilateral nonobstructive nephrolithiasis, including 16 mm right lower pole stone. Electronically Signed   By: Ulyses Jarred M.D.   On: 12/20/2017 04:49   Ct Head Wo Contrast  Result Date: 12/20/2017 CLINICAL DATA:  Constipation. Patient passed out. Elevated blood pressure. EXAM: CT HEAD WITHOUT CONTRAST TECHNIQUE: Contiguous axial images were obtained from the base of the skull through the vertex without intravenous contrast. COMPARISON:  MRI brain 01/28/2017. CT head 01/27/2017. FINDINGS: Brain: Diffuse cerebral atrophy. Ventricular dilatation consistent with central atrophy. Low-attenuation changes in the deep white matter consistent with small vessel ischemia. Lacunar infarcts in the right basal ganglia. Focal area of low-attenuation in the left posterior parietal region is unchanged since prior study, likely representing old infarct. No mass effect or midline shift. No abnormal extra-axial fluid collections. Gray-white matter junctions are distinct. Basal cisterns are not effaced. No acute intracranial hemorrhage. Vascular: Moderate intracranial arterial vascular calcifications are  present. Skull: Calvarium appears intact. No acute depressed skull fractures. Sinuses/Orbits: Opacification of the right frontal, ethmoid, and maxillary sinuses, likely representing sinusitis. This is new since previous study. Other: None. IMPRESSION: No acute intracranial abnormalities. Chronic atrophy and small vessel ischemic changes. Old infarct in the left posterior parietal region. Opacification of right frontal, ethmoid, and maxillary sinuses likely representing sinusitis. Electronically Signed   By: Lucienne Capers M.D.   On: 12/20/2017 04:44

## 2017-12-20 NOTE — ED Provider Notes (Signed)
[facility] Emergency Department Provider Note  ____________________________________________   First MD Initiated Contact with Patient 12/20/17 [phone]     (approximate)  I have reviewed the triage vital signs and the nursing notes.   HISTORY  Chief Complaint Loss of Consciousness and Constipation   HPI Katelyn [name] is a 84 y.o. female comes to the emergency department via EMS with several days of abdominal pain and constipation.  This evening she sat on the toilet to have a bowel movement and when she strained she felt lightheaded and passed out onto the ground.  When EMS arrived they noted that she was in atrial fibrillation with a normal blood sugar in the transported her to the hospital.  The patient has a long history of chronic constipation.  She takes no chronic pain medication.  Her syncope today was sudden onset severe worse with straining and improved thereafter.  It was associated with nausea.  No antecedent palpitations or chest pain.    Past Medical History:  Diagnosis Date  . Abdominal pain   . Anxiety   . Atherosclerosis   . Chronic kidney disease    stage 3  . Colon polyps   . Constipation   . Cough with expectoration   . Dependent edema bilateral legs  . Gait abnormality   . Gout   . Hearing loss   . Hearing loss   . Hemorrhoids   . Hypertension   . Incontinence   . Osteoarthritis   . Osteoarthritis   . Osteoarthritis   . Paget's disease of bone   . Peripheral vascular disease ([place])   . Pneumonia   . Proteinuria   . Stroke [facility])     Patient Active Problem List   Diagnosis Date Noted  . Altered mental status   . PAD (peripheral artery disease) ([place]) 06/11/2016  . CVA (cerebral vascular accident) ([place]) 12/23/2014  . CKD (chronic kidney disease) stage 3, GFR 30-59 ml/min (HCC) 10/20/2014  . Hyperglycemia 10/20/2014  . Anxiety, mild 07/05/2014  . Essential (primary) hypertension 07/05/2014  . Acquired hallux valgus  07/05/2014  . Gastritis, Helicobacter pylori 95/28/4132  . History of colon polyps 07/05/2014  . Arthritis of knee, degenerative 07/05/2014    Past Surgical History:  Procedure Laterality Date  . PARTIAL HYSTERECTOMY    . PERIPHERAL VASCULAR CATHETERIZATION N/A 01/13/2015   Procedure: Abdominal Aortogram w/Lower Extremity;  Surgeon: Algernon Huxley, MD;  Location: [place] [facility];  Service: Cardiovascular;  Laterality: N/A;  . PERIPHERAL VASCULAR CATHETERIZATION  01/13/2015   Procedure: Lower Extremity Intervention;  Surgeon: Algernon Huxley, MD;  Location: [place] [facility];  Service: Cardiovascular;;    Prior to Admission medications   Medication Sig Start Date End Date Taking? Authorizing Provider  acetaminophen (TYLENOL) 325 MG tablet Take 2 tablets (650 mg total) by mouth every 6 (six) hours as needed for mild pain (or Fever >/= 101). 01/29/17   Gladstone Lighter, MD  amLODipine (NORVASC) 5 MG tablet Take 1 tablet (5 mg total) by mouth daily. 12/27/14   Nicholes Mango, MD  apixaban (ELIQUIS) 2.5 MG TABS tablet Take 1 tablet (2.5 mg total) by mouth 2 (two) times daily. 01/13/15   Algernon Huxley, MD  atorvastatin (LIPITOR) 40 MG tablet Take 1 tablet (40 mg total) by mouth daily at 6 PM. 01/29/17   Gladstone Lighter, MD  cetirizine (ZYRTEC) 5 MG tablet Take 5 mg at bedtime by mouth.    [provider]  chlorpheniramine-HYDROcodone (  TUSSIONEX) 10-8 MG/5ML SUER Take 5 mLs by mouth every 12 (twelve) hours. X 10 days and stop 01/29/17   Gladstone Lighter, MD  docusate sodium (COLACE) 100 MG capsule Take 1 capsule (100 mg total) by mouth 2 (two) times daily. 01/29/17   Gladstone Lighter, MD  donepezil (ARICEPT) 10 MG tablet Take 10 mg daily by mouth.    [provider]  fluticasone (FLONASE) 50 MCG/ACT nasal spray Place 2 sprays into both nostrils daily. 09/03/14   Glean Hess, MD  hydrocortisone (ANUSOL-HC) 2.5 % rectal cream Place 1 application rectally 2 (two) times  daily as needed for hemorrhoids.    [provider]  losartan (COZAAR) 50 MG tablet Take 50 mg by mouth daily.    [provider]  methimazole (TAPAZOLE) 10 MG tablet Take 5 mg 2 (two) times daily by mouth.    [provider]  polyethylene glycol (MIRALAX / GLYCOLAX) packet Take 17 g by mouth daily.    [provider]  sennosides-docusate sodium (SENOKOT-S) 8.6-50 MG tablet Take 1 tablet 2 (two) times daily as needed by mouth for constipation.    [provider]    Allergies Bee venom  Family History  Problem Relation Age of Onset  . Diabetes Mother   . Cancer Sister   . Cancer Sister   . Cancer Sister     Social History Social History   Tobacco Use  . Smoking status: Never Smoker  . Smokeless tobacco: Never Used  Substance Use Topics  . Alcohol use: No    Alcohol/week: 0.0 standard drinks  . Drug use: No    Review of Systems Constitutional: No fever/chills Eyes: No visual changes. ENT: No sore throat. Cardiovascular: Denies chest pain. Respiratory: Denies shortness of breath. Gastrointestinal: Positive for abdominal pain.  Positive for nausea, no vomiting.  No diarrhea.  Positive for constipation. Genitourinary: Negative for dysuria. Musculoskeletal: Negative for back pain. Skin: Negative for rash. Neurological: Negative for headaches, focal weakness or numbness.   ____________________________________________   PHYSICAL EXAM:  VITAL SIGNS: ED Triage Vitals  Enc Vitals Group     BP      Pulse      Resp      Temp      Temp src      SpO2      Weight      Height      Head Circumference      Peak Flow      Pain Score      Pain Loc      Pain Edu?      Excl. in [place]?     Constitutional: Alert and oriented x4 pleasant cooperative speaks full clear sentences no diaphoresis Eyes: PERRL EOMI. Head: Atraumatic. Nose: No congestion/rhinnorhea. Mouth/Throat: No trismus no tongue lacerations Neck: No stridor.     Cardiovascular: Regular rate and rhythm no murmurs appreciated Respiratory: Normal respiratory effort.  No retractions. Lungs CTAB and moving good air Gastrointestinal: Soft nontender no distention Musculoskeletal: No lower extremity edema   Neurologic:  Normal speech and language. No gross focal neurologic deficits are appreciated. Skin:  Skin is warm, dry and intact. No rash noted. Psychiatric: Mood and affect are normal. Speech and behavior are normal.    ____________________________________________   DIFFERENTIAL includes but not limited to  Vasovagal syncope, cardiogenic syncope, functional constipation, bowel obstruction ____________________________________________   LABS (all labs ordered are listed, but only abnormal results are displayed)  Labs Reviewed  COMPREHENSIVE METABOLIC PANEL -  Abnormal; Notable for the following components:      Result Value   Glucose, Bld 166 (*)    Creatinine, Ser 1.03 (*)    GFR calc non Af Amer 46 (*)    GFR calc Af Amer 53 (*)    All other components within normal limits  TROPONIN I - Abnormal; Notable for the following components:   Troponin I 0.03 (*)    All other components within normal limits  CBC WITH DIFFERENTIAL/PLATELET - Abnormal; Notable for the following components:   RBC 3.44 (*)    Hemoglobin 10.8 (*)    HCT 33.9 (*)    All other components within normal limits    Lab work reviewed by me shows slightly elevated troponin likely secondary to demand and not true primary myocardial ischemia  __________________________________________  EKG  ED ECG REPORT I, Darel Hong, the attending physician, personally viewed and interpreted this ECG.  Date: 12/22/2017 EKG Time:  Rate: 67 Rhythm: normal sinus rhythm QRS Axis: Leftward axis Intervals: normal ST/T Wave abnormalities: normal Narrative Interpretation: no evidence of acute ischemia  ____________________________________________  RADIOLOGY  CT scan of the  head reviewed by me with no acute disease CT scan of the abdomen pelvis reviewed by me with no evidence of obstruction ____________________________________________   PROCEDURES  Procedure(s) performed: yes  ------------------------------------------------------------------------------------------------------------------- Fecal Disimpaction Procedure Note:  Performed by me:  Patient placed in the lateral recumbent position with knees drawn towards chest. Nurse present for patient support. Large amount of hard brown stool removed. No complications during procedure.   ------------------------------------------------------------------------------------------------------------------    Procedures  Critical Care performed: no  ____________________________________________   INITIAL IMPRESSION / ASSESSMENT AND PLAN / ED COURSE  Pertinent labs & imaging results that were available during my care of the patient were reviewed by me and considered in my medical decision making (see chart for details).   As part of my medical decision making, I reviewed the following data within the [facility] History obtained from family if available, nursing notes, old chart and ekg, as well as notes from prior ED visits.  The patient comes to the emergency department with a syncopal episode while straining on the toilet.  She was kept on monitor multiple hours with no ectopy.  Head CT performed as she did strain and is on Eliquis and is negative for hemorrhage.  CT of the abdomen pelvis likewise negative for obstruction.  I verbally consented the patient for disimpaction and was able to remove a large amount of stool.  Subsequently gave an enema and she had a large bowel movement and she feels significant improvement.  Will be discharged home with strict return precautions.     ____________________________________________   FINAL CLINICAL IMPRESSION(S) / ED DIAGNOSES  Final  diagnoses:  Vasovagal syncope  Constipation, unspecified constipation type      NEW MEDICATIONS STARTED DURING THIS VISIT:  Discharge Medication List as of 12/20/2017  5:44 AM       Note:  This document was prepared using Dragon voice recognition software and may include unintentional dictation errors.     Darel Hong, MD 12/22/17 2147

## 2018-04-21 ENCOUNTER — Emergency Department: Payer: Medicare (Managed Care)

## 2018-04-21 ENCOUNTER — Other Ambulatory Visit: Payer: Self-pay

## 2018-04-21 ENCOUNTER — Encounter: Payer: Self-pay | Admitting: Emergency Medicine

## 2018-04-21 ENCOUNTER — Inpatient Hospital Stay
Admission: EM | Admit: 2018-04-21 | Discharge: 2018-04-23 | DRG: 871 | Disposition: A | Discharge status: Home / Self Care | Payer: Medicare (Managed Care) | Attending: Specialist | Admitting: Specialist

## 2018-04-21 DIAGNOSIS — M1990 Unspecified osteoarthritis, unspecified site: Secondary | ICD-10-CM | POA: Diagnosis present

## 2018-04-21 DIAGNOSIS — L89322 Pressure ulcer of left buttock, stage 2: Secondary | ICD-10-CM | POA: Diagnosis present

## 2018-04-21 DIAGNOSIS — L89312 Pressure ulcer of right buttock, stage 2: Secondary | ICD-10-CM | POA: Diagnosis present

## 2018-04-21 DIAGNOSIS — I129 Hypertensive chronic kidney disease with stage 1 through stage 4 chronic kidney disease, or unspecified chronic kidney disease: Secondary | ICD-10-CM | POA: Diagnosis present

## 2018-04-21 DIAGNOSIS — R531 Weakness: Secondary | ICD-10-CM | POA: Diagnosis present

## 2018-04-21 DIAGNOSIS — Z66 Do not resuscitate: Secondary | ICD-10-CM | POA: Diagnosis present

## 2018-04-21 DIAGNOSIS — M889 Osteitis deformans of unspecified bone: Secondary | ICD-10-CM | POA: Diagnosis present

## 2018-04-21 DIAGNOSIS — Z8673 Personal history of transient ischemic attack (TIA), and cerebral infarction without residual deficits: Secondary | ICD-10-CM | POA: Diagnosis not present

## 2018-04-21 DIAGNOSIS — R0902 Hypoxemia: Secondary | ICD-10-CM | POA: Diagnosis present

## 2018-04-21 DIAGNOSIS — N183 Chronic kidney disease, stage 3 (moderate): Secondary | ICD-10-CM | POA: Diagnosis present

## 2018-04-21 DIAGNOSIS — Z8719 Personal history of other diseases of the digestive system: Secondary | ICD-10-CM

## 2018-04-21 DIAGNOSIS — Z79899 Other long term (current) drug therapy: Secondary | ICD-10-CM

## 2018-04-21 DIAGNOSIS — Z993 Dependence on wheelchair: Secondary | ICD-10-CM

## 2018-04-21 DIAGNOSIS — J189 Pneumonia, unspecified organism: Secondary | ICD-10-CM | POA: Diagnosis present

## 2018-04-21 DIAGNOSIS — Z7902 Long term (current) use of antithrombotics/antiplatelets: Secondary | ICD-10-CM | POA: Diagnosis not present

## 2018-04-21 DIAGNOSIS — L8990 Pressure ulcer of unspecified site, unspecified stage: Secondary | ICD-10-CM

## 2018-04-21 DIAGNOSIS — H9190 Unspecified hearing loss, unspecified ear: Secondary | ICD-10-CM | POA: Diagnosis present

## 2018-04-21 DIAGNOSIS — Z91030 Bee allergy status: Secondary | ICD-10-CM | POA: Diagnosis not present

## 2018-04-21 DIAGNOSIS — A419 Sepsis, unspecified organism: Secondary | ICD-10-CM | POA: Diagnosis present

## 2018-04-21 DIAGNOSIS — I739 Peripheral vascular disease, unspecified: Secondary | ICD-10-CM | POA: Diagnosis present

## 2018-04-21 DIAGNOSIS — M109 Gout, unspecified: Secondary | ICD-10-CM | POA: Diagnosis present

## 2018-04-21 DIAGNOSIS — R109 Unspecified abdominal pain: Secondary | ICD-10-CM | POA: Diagnosis present

## 2018-04-21 DIAGNOSIS — F419 Anxiety disorder, unspecified: Secondary | ICD-10-CM | POA: Diagnosis present

## 2018-04-21 DIAGNOSIS — E0590 Thyrotoxicosis, unspecified without thyrotoxic crisis or storm: Secondary | ICD-10-CM | POA: Diagnosis present

## 2018-04-21 DIAGNOSIS — F0390 Unspecified dementia without behavioral disturbance: Secondary | ICD-10-CM | POA: Diagnosis present

## 2018-04-21 LAB — INFLUENZA PANEL BY PCR (TYPE A & B)
INFLAPCR: NEGATIVE
Influenza B By PCR: NEGATIVE

## 2018-04-21 LAB — TSH: TSH: 1.842 u[IU]/mL (ref 0.350–4.500)

## 2018-04-21 LAB — COMPREHENSIVE METABOLIC PANEL
ALT: 9 U/L (ref 0–44)
AST: 15 U/L (ref 15–41)
Albumin: 3.5 g/dL (ref 3.5–5.0)
Alkaline Phosphatase: 53 U/L (ref 38–126)
Anion gap: 7 (ref 5–15)
BUN: 27 mg/dL — ABNORMAL HIGH (ref 8–23)
CO2: 27 mmol/L (ref 22–32)
CREATININE: 1.15 mg/dL — AB (ref 0.44–1.00)
Calcium: 9 mg/dL (ref 8.9–10.3)
Chloride: 105 mmol/L (ref 98–111)
GFR calc non Af Amer: 41 mL/min — ABNORMAL LOW (ref >60)
GFR, EST AFRICAN AMERICAN: 48 mL/min — AB (ref >60)
Glucose, Bld: 138 mg/dL — ABNORMAL HIGH (ref 70–99)
Potassium: 4.2 mmol/L (ref 3.5–5.1)
Sodium: 139 mmol/L (ref 135–145)
Total Bilirubin: 0.7 mg/dL (ref 0.3–1.2)
Total Protein: 7 g/dL (ref 6.5–8.1)

## 2018-04-21 LAB — CBC WITH DIFFERENTIAL/PLATELET
Abs Immature Granulocytes: 0.07 10*3/uL (ref 0.00–0.07)
Basophils Absolute: 0 10*3/uL (ref 0.0–0.1)
Basophils Relative: 0 %
Eosinophils Absolute: 0 10*3/uL (ref 0.0–0.5)
Eosinophils Relative: 0 %
HCT: 31.1 % — ABNORMAL LOW (ref 36.0–46.0)
HEMOGLOBIN: 9.8 g/dL — AB (ref 12.0–15.0)
Immature Granulocytes: 1 %
Lymphocytes Relative: 4 %
Lymphs Abs: 0.6 10*3/uL — ABNORMAL LOW (ref 0.7–4.0)
MCH: 30.9 pg (ref 26.0–34.0)
MCHC: 31.5 g/dL (ref 30.0–36.0)
MCV: 98.1 fL (ref 80.0–100.0)
MONO ABS: 0.5 10*3/uL (ref 0.1–1.0)
Monocytes Relative: 4 %
Neutro Abs: 12.9 10*3/uL — ABNORMAL HIGH (ref 1.7–7.7)
Neutrophils Relative %: 91 %
Platelets: 253 10*3/uL (ref 150–400)
RBC: 3.17 MIL/uL — ABNORMAL LOW (ref 3.87–5.11)
RDW: 14 % (ref 11.5–15.5)
WBC: 14 10*3/uL — ABNORMAL HIGH (ref 4.0–10.5)
nRBC: 0 % (ref 0.0–0.2)

## 2018-04-21 LAB — URINALYSIS, ROUTINE W REFLEX MICROSCOPIC
Bacteria, UA: NONE SEEN
Bilirubin Urine: NEGATIVE
Glucose, UA: NEGATIVE mg/dL
Hgb urine dipstick: NEGATIVE
Ketones, ur: NEGATIVE mg/dL
Nitrite: NEGATIVE
Protein, ur: NEGATIVE mg/dL
SPECIFIC GRAVITY, URINE: 1.023 (ref 1.005–1.030)
Squamous Epithelial / HPF: NONE SEEN (ref 0–5)
pH: 6 (ref 5.0–8.0)

## 2018-04-21 LAB — TROPONIN I
Troponin I: 0.03 ng/mL (ref <0.03)
Troponin I: 0.03 ng/mL (ref <0.03)

## 2018-04-21 LAB — LACTIC ACID, PLASMA
Lactic Acid, Venous: 2.6 mmol/L (ref 0.5–1.9)
Lactic Acid, Venous: 3.9 mmol/L (ref 0.5–1.9)

## 2018-04-21 LAB — LIPASE, BLOOD: Lipase: 18 U/L (ref 11–51)

## 2018-04-21 LAB — PROTIME-INR
INR: 1.24
Prothrombin Time: 15.5 seconds — ABNORMAL HIGH (ref 11.4–15.2)

## 2018-04-21 MED ORDER — SODIUM CHLORIDE 0.9 % IV SOLN
1.0000 g | INTRAVENOUS | Status: DC
  Administered 2018-04-22 – 2018-04-23 (×2): 1 g via INTRAVENOUS
  Filled 2018-04-21 (×2): qty 1
  Filled 2018-04-21: qty 10

## 2018-04-21 MED ORDER — SENNOSIDES-DOCUSATE SODIUM 8.6-50 MG PO TABS: 1.0000 | ORAL_TABLET | Freq: Two times a day (BID) | ORAL | Status: DC | PRN

## 2018-04-21 MED ORDER — ONDANSETRON HCL 4 MG/2ML IJ SOLN: 4.0000 mg | Freq: Four times a day (QID) | INTRAMUSCULAR | Status: DC | PRN

## 2018-04-21 MED ORDER — POLYETHYLENE GLYCOL 3350 17 G PO PACK
17.0000 g | PACK | Freq: Every day | ORAL | Status: DC
  Administered 2018-04-21 – 2018-04-23 (×3): 17 g via ORAL
  Filled 2018-04-21 (×3): qty 1

## 2018-04-21 MED ORDER — SODIUM CHLORIDE 0.9 % IV BOLUS
1000.0000 mL | Freq: Once | INTRAVENOUS | Status: AC
  Administered 2018-04-22: 1000 mL via INTRAVENOUS

## 2018-04-21 MED ORDER — APIXABAN 2.5 MG PO TABS
2.5000 mg | ORAL_TABLET | Freq: Two times a day (BID) | ORAL | Status: DC
  Administered 2018-04-21 – 2018-04-23 (×4): 2.5 mg via ORAL
  Filled 2018-04-21 (×4): qty 1

## 2018-04-21 MED ORDER — DOCUSATE SODIUM 100 MG PO CAPS
100.0000 mg | ORAL_CAPSULE | Freq: Two times a day (BID) | ORAL | Status: DC
  Administered 2018-04-21 – 2018-04-23 (×4): 100 mg via ORAL
  Filled 2018-04-21 (×4): qty 1

## 2018-04-21 MED ORDER — AMLODIPINE BESYLATE 5 MG PO TABS
5.0000 mg | ORAL_TABLET | Freq: Every day | ORAL | Status: DC
  Administered 2018-04-21 – 2018-04-23 (×3): 5 mg via ORAL
  Filled 2018-04-21 (×3): qty 1

## 2018-04-21 MED ORDER — ONDANSETRON HCL 4 MG PO TABS: 4.0000 mg | ORAL_TABLET | Freq: Four times a day (QID) | ORAL | Status: DC | PRN

## 2018-04-21 MED ORDER — SODIUM CHLORIDE 0.9 % IV SOLN
2.0000 g | Freq: Once | INTRAVENOUS | Status: AC
  Administered 2018-04-21: 2 g via INTRAVENOUS
  Filled 2018-04-21: qty 20

## 2018-04-21 MED ORDER — FLUTICASONE PROPIONATE 50 MCG/ACT NA SUSP
2.0000 | Freq: Every day | NASAL | Status: DC
  Administered 2018-04-22 – 2018-04-23 (×2): 2 via NASAL
  Filled 2018-04-21: qty 16

## 2018-04-21 MED ORDER — ACETAMINOPHEN 325 MG PO TABS: 650.0000 mg | ORAL_TABLET | Freq: Four times a day (QID) | ORAL | Status: DC | PRN

## 2018-04-21 MED ORDER — LOSARTAN POTASSIUM 50 MG PO TABS
50.0000 mg | ORAL_TABLET | Freq: Every day | ORAL | Status: DC
  Administered 2018-04-21 – 2018-04-23 (×3): 50 mg via ORAL
  Filled 2018-04-21 (×3): qty 1

## 2018-04-21 MED ORDER — ATORVASTATIN CALCIUM 20 MG PO TABS
40.0000 mg | ORAL_TABLET | Freq: Every day | ORAL | Status: DC
  Administered 2018-04-21 – 2018-04-22 (×2): 40 mg via ORAL
  Filled 2018-04-21 (×2): qty 2

## 2018-04-21 MED ORDER — SODIUM CHLORIDE 0.9 % IV BOLUS
500.0000 mL | Freq: Once | INTRAVENOUS | Status: AC
  Administered 2018-04-21: 500 mL via INTRAVENOUS

## 2018-04-21 MED ORDER — DONEPEZIL HCL 5 MG PO TABS
10.0000 mg | ORAL_TABLET | Freq: Every day | ORAL | Status: DC
  Administered 2018-04-21 – 2018-04-23 (×3): 10 mg via ORAL
  Filled 2018-04-21 (×3): qty 2

## 2018-04-21 MED ORDER — SODIUM CHLORIDE 0.9 % IV BOLUS
500.0000 mL | Freq: Once | INTRAVENOUS | Status: AC
  Administered 2018-04-22: 500 mL via INTRAVENOUS

## 2018-04-21 MED ORDER — AZITHROMYCIN 500 MG PO TABS
500.0000 mg | ORAL_TABLET | Freq: Once | ORAL | Status: AC
  Administered 2018-04-21: 500 mg via ORAL
  Filled 2018-04-21: qty 1

## 2018-04-21 MED ORDER — HYDROCOD POLST-CPM POLST ER 10-8 MG/5ML PO SUER: 5.0000 mL | Freq: Two times a day (BID) | ORAL | Status: DC

## 2018-04-21 MED ORDER — SODIUM CHLORIDE 0.9% FLUSH
3.0000 mL | Freq: Two times a day (BID) | INTRAVENOUS | Status: DC
  Administered 2018-04-21 – 2018-04-23 (×3): 3 mL via INTRAVENOUS

## 2018-04-21 MED ORDER — SODIUM CHLORIDE 0.9 % IV SOLN
INTRAVENOUS | Status: DC
  Administered 2018-04-21 – 2018-04-22 (×2): via INTRAVENOUS

## 2018-04-21 MED ORDER — SODIUM CHLORIDE 0.9 % IV SOLN
500.0000 mg | INTRAVENOUS | Status: DC
  Administered 2018-04-22 – 2018-04-23 (×2): 500 mg via INTRAVENOUS
  Filled 2018-04-21 (×3): qty 500

## 2018-04-21 MED ORDER — METHIMAZOLE 5 MG PO TABS
5.0000 mg | ORAL_TABLET | Freq: Two times a day (BID) | ORAL | Status: DC
  Administered 2018-04-21 – 2018-04-23 (×4): 5 mg via ORAL
  Filled 2018-04-21 (×5): qty 1

## 2018-04-21 NOTE — ED Notes (Signed)
Date and time results received: 04/21/18 1755 (use smartphrase ".now" to insert current time)  Test: Troponin Critical Value: 0.03  Name of Provider Notified: Dr. Jacqualine Code  Orders Received? Or Actions Taken?: Orders Received - See Orders for details

## 2018-04-21 NOTE — ED Notes (Signed)
Patient transported to X-ray 

## 2018-04-21 NOTE — Progress Notes (Signed)
CODE SEPSIS - PHARMACY COMMUNICATION  **Broad Spectrum Antibiotics should be administered within 1 hour of Sepsis diagnosis**  Time Code Sepsis Called/Page Received: 1822  Antibiotics Ordered: azithromycin and ceftriaxone  Time of 1st antibiotic administration: 1821  Additional action taken by pharmacy: n/a  If necessary, Name of Provider/Nurse Contacted: [place] ,PharmD Clinical Pharmacist  04/21/2018  6:22 PM

## 2018-04-21 NOTE — Progress Notes (Signed)
   [place] [facility]: 0 days Katelyn [name] is a 85 y.o. female presenting with Hypoxia and Abdominal Pain .   Advance care planning discussed with patient and her daughters at bedside.   Diagnosis- sepsis, pneumonia Past medical history of mild dementia, PAD, hypertension  All questions in regards to overall condition and expected prognosis answered.  Patient already has a living will in place with DNR signed according to daughter who is her healthcare power of attorney. The decision was made to continue current code status  CODE STATUS: DNR Time spent: 18 minutes

## 2018-04-21 NOTE — ED Notes (Signed)
Date and time results received: 04/21/18 6:59 PM  (use smartphrase ".now" to insert current time)  Test: Lactic Acid Critical Value: 2.6  Name of Provider Notified: Dr. Jacqualine Code  Orders Received? Or Actions Taken?: Orders Received - See Orders for details

## 2018-04-21 NOTE — ED Notes (Signed)
Pt being transferred to room 242-2A

## 2018-04-21 NOTE — ED Triage Notes (Signed)
Pt presents from [facility] via [facility] with c/o abdominal pain, nausea, and hypoxia. Facility reported to EMS that pt came in for abdominal pain and cramping but was found to be 80% on room air. Pt current oxygen saturation at 95% on room air. Pt denies any SOB or chest pain at this time. Pt blood sugar 154. Hx of kidney failure. Daughter is health care power of attorney.

## 2018-04-21 NOTE — ED Provider Notes (Signed)
[facility] Emergency Department Provider Note   ____________________________________________   First MD Initiated Contact with Patient 04/21/18 1813     (approximate)  I have reviewed the triage vital signs and the nursing notes.   HISTORY  Chief Complaint Hypoxia and Abdominal Pain  EM caveat: Limited by some confusion possible dementia.  HPI Odessia Asleson Steffensmeier is a 85 y.o. female presents for evaluation from clinic for low oxygen levels, also noted to be febrile  Family reports patient has not felt well  and has been experiencing nausea.  She was transferred to her doctors for evaluation, her oxygen was low transferred to the ER for further work-up.  Patient herself denies pain.  Does report a slight cough.  Past Medical History:  Diagnosis Date  . Abdominal pain   . Anxiety   . Atherosclerosis   . Chronic kidney disease    stage 3  . Colon polyps   . Constipation   . Cough with expectoration   . Dependent edema bilateral legs  . Gait abnormality   . Gout   . Hearing loss   . Hearing loss   . Hemorrhoids   . Hypertension   . Incontinence   . Osteoarthritis   . Osteoarthritis   . Osteoarthritis   . Paget's disease of bone   . Peripheral vascular disease ([place])   . Pneumonia   . Proteinuria   . Stroke [facility])     Patient Active Problem List   Diagnosis Date Noted  . CAP (community acquired pneumonia) 04/21/2018  . Altered mental status   . PAD (peripheral artery disease) ([place]) 06/11/2016  . CVA (cerebral vascular accident) ([place]) 12/23/2014  . CKD (chronic kidney disease) stage 3, GFR 30-59 ml/min (HCC) 10/20/2014  . Hyperglycemia 10/20/2014  . Anxiety, mild 07/05/2014  . Essential (primary) hypertension 07/05/2014  . Acquired hallux valgus 07/05/2014  . Gastritis, Helicobacter pylori 28/63/8177  . History of colon polyps 07/05/2014  . Arthritis of knee, degenerative 07/05/2014    Past Surgical History:  Procedure  Laterality Date  . PARTIAL HYSTERECTOMY    . PERIPHERAL VASCULAR CATHETERIZATION N/A 01/13/2015   Procedure: Abdominal Aortogram w/Lower Extremity;  Surgeon: Algernon Huxley, MD;  Location: [place] CV LAB;  Service: Cardiovascular;  Laterality: N/A;  . PERIPHERAL VASCULAR CATHETERIZATION  01/13/2015   Procedure: Lower Extremity Intervention;  Surgeon: Algernon Huxley, MD;  Location: [place] CV LAB;  Service: Cardiovascular;;    Prior to Admission medications   Medication Sig Start Date End Date Taking? Authorizing Provider  acetaminophen (TYLENOL) 325 MG tablet Take 2 tablets (650 mg total) by mouth every 6 (six) hours as needed for mild pain (or Fever >/= 101). 01/29/17   Gladstone Lighter, MD  amLODipine (NORVASC) 5 MG tablet Take 1 tablet (5 mg total) by mouth daily. 12/27/14   Nicholes Mango, MD  apixaban (ELIQUIS) 2.5 MG TABS tablet Take 1 tablet (2.5 mg total) by mouth 2 (two) times daily. 01/13/15   Algernon Huxley, MD  atorvastatin (LIPITOR) 40 MG tablet Take 1 tablet (40 mg total) by mouth daily at 6 PM. 01/29/17   Gladstone Lighter, MD  cetirizine (ZYRTEC) 5 MG tablet Take 5 mg at bedtime by mouth.    [provider]  chlorpheniramine-HYDROcodone (TUSSIONEX) 10-8 MG/5ML SUER Take 5 mLs by mouth every 12 (twelve) hours. X 10 days and stop 01/29/17   Gladstone Lighter, MD  docusate sodium (COLACE) 100 MG capsule Take 1 capsule (100 mg total)  by mouth 2 (two) times daily. 01/29/17   Gladstone Lighter, MD  donepezil (ARICEPT) 10 MG tablet Take 10 mg daily by mouth.    [provider]  fluticasone (FLONASE) 50 MCG/ACT nasal spray Place 2 sprays into both nostrils daily. 09/03/14   Glean Hess, MD  hydrocortisone (ANUSOL-HC) 2.5 % rectal cream Place 1 application rectally 2 (two) times daily as needed for hemorrhoids.    [provider]  losartan (COZAAR) 50 MG tablet Take 50 mg by mouth daily.    [provider]  methimazole (TAPAZOLE) 10 MG tablet  Take 5 mg 2 (two) times daily by mouth.    [provider]  polyethylene glycol (MIRALAX / GLYCOLAX) packet Take 17 g by mouth daily.    [provider]  sennosides-docusate sodium (SENOKOT-S) 8.6-50 MG tablet Take 1 tablet 2 (two) times daily as needed by mouth for constipation.    [provider]    Allergies Bee venom  Family History  Problem Relation Age of Onset  . Diabetes Mother   . Colon cancer Father   . Cancer Sister   . Cancer Sister   . Cancer Sister     Social History Social History   Tobacco Use  . Smoking status: Never Smoker  . Smokeless tobacco: Never Used  Substance Use Topics  . Alcohol use: No    Alcohol/week: 0.0 standard drinks  . Drug use: No    Review of Systems  EM caveat   ____________________________________________   PHYSICAL EXAM:  VITAL SIGNS: ED Triage Vitals  Enc Vitals Group     BP 04/21/18 1630 (!) 167/71     Pulse Rate 04/21/18 1630 95     Resp 04/21/18 1630 20     Temp 04/21/18 1633 98 F (36.7 C)     Temp Source 04/21/18 1633 Oral     SpO2 04/21/18 1630 94 %     Weight 04/21/18 1633 147 lb (66.7 kg)     Height 04/21/18 1633 5\' 5"  (1.651 m)     Head Circumference --      Peak Flow --      Pain Score 04/21/18 1633 6     Pain Loc --      Pain Edu? --      Excl. in [place]? --     Constitutional: Alert and oriented to self and recognizes family.  Not well oriented to situation Eyes: Conjunctivae are normal. Head: Atraumatic. Nose: No congestion/rhinnorhea. Mouth/Throat: Mucous membranes are moist. Neck: No stridor.  Cardiovascular: Normal rate, regular rhythm. Grossly normal heart sounds.  Good peripheral circulation. Respiratory: Normal respiratory effort though just slightly tachypneic.  No retractions. Lungs CTAB except for some faint crackles in the bases bilateral. Gastrointestinal: Soft and nontender. No distention. Musculoskeletal: No lower extremity tenderness nor edema. Neurologic:   Normal speech and language. No gross focal neurologic deficits are appreciated.  Skin:  Skin is warm, dry and intact. No rash noted. Psychiatric: Mood and affect are normal. Speech and behavior are normal.  ____________________________________________   LABS (all labs ordered are listed, but only abnormal results are displayed)  Labs Reviewed  LACTIC ACID, PLASMA - Abnormal; Notable for the following components:      Result Value   Lactic Acid, Venous 2.6 (*)    All other components within normal limits  COMPREHENSIVE METABOLIC PANEL - Abnormal; Notable for the following components:   Glucose, Bld 138 (*)    BUN 27 (*)    Creatinine,  Ser 1.15 (*)    GFR calc non Af Amer 41 (*)    GFR calc Af Amer 48 (*)    All other components within normal limits  CBC WITH DIFFERENTIAL/PLATELET - Abnormal; Notable for the following components:   WBC 14.0 (*)    RBC 3.17 (*)    Hemoglobin 9.8 (*)    HCT 31.1 (*)    Neutro Abs 12.9 (*)    Lymphs Abs 0.6 (*)    All other components within normal limits  PROTIME-INR - Abnormal; Notable for the following components:   Prothrombin Time 15.5 (*)    All other components within normal limits  TROPONIN I - Abnormal; Notable for the following components:   Troponin I 0.03 (*)    All other components within normal limits  CULTURE, BLOOD (ROUTINE X 2)  CULTURE, BLOOD (ROUTINE X 2)  INFLUENZA PANEL BY PCR (TYPE A & B)  LIPASE, BLOOD  LACTIC ACID, PLASMA  URINALYSIS, ROUTINE W REFLEX MICROSCOPIC   ____________________________________________  EKG  Reviewed by me at 1635 Heart rate 100 QRS 120 QTc 430 Normal sinus rhythm, T wave inversions lateral precordial leads, compared with previous EKG these appear to be longstanding.  No obvious acute change other than elevated rate ____________________________________________  RADIOLOGY  Dg Chest 2 View  Result Date: 04/21/2018 CLINICAL DATA:  Fever and fatigue EXAM: CHEST - 2 VIEW COMPARISON:   01/27/2017, 10/13/2014 FINDINGS: Patchy opacities at the bases consistent with pneumonia and or aspiration. No pleural effusion. Borderline cardiomegaly with aortic atherosclerosis. Left paratracheal mass with deviation of the trachea to the right. No pneumothorax. IMPRESSION: 1. Patchy bibasilar infiltrates. 2. Borderline to mild cardiomegaly 3. Left paratracheal mass presumably due to thyroidal enlargement. This finding is unchanged. Electronically Signed   By: Donavan Foil M.D.   On: 04/21/2018 17:39    ____________________________________________   PROCEDURES  Procedure(s) performed: None  Procedures  Critical Care performed: Yes, see critical care note(s)  CRITICAL CARE Performed by: Delman Kitten   Total critical care time: 37 minutes  Critical care time was exclusive of separately billable procedures and treating other patients.  Critical care was necessary to treat or prevent imminent or life-threatening deterioration.  Critical care was time spent personally by me on the following activities: development of treatment plan with patient and/or surrogate as well as nursing, discussions with consultants, evaluation of patient's response to treatment, examination of patient, obtaining history from patient or surrogate, ordering and performing treatments and interventions, ordering and review of laboratory studies, ordering and review of radiographic studies, pulse oximetry and re-evaluation of patient's condition.  Patient with elevated lactate, concern for community-acquired pneumonia with sepsis.  Meets criteria with elevated white count and tachypnea.  2 antibiotics ordered, IV fluids administered, patient at elevated risk for cardiovascular collapse and morbidity mortality secondary to sepsis ____________________________________________   INITIAL IMPRESSION / ASSESSMENT AND PLAN / ED COURSE  Pertinent labs & imaging results that were available during my care of the patient were  reviewed by me and considered in my medical decision making (see chart for details).   Patient presents for concerns of low oxygen saturations, fever.  Does have slight crackles in both lower lobes, flu negative, elevated white count, fever to 100.5 with EMS, and bilateral lower lobe infiltrates on x-ray.  Given his history most suspect pneumonia.  Discussed with family goals of care, she is DNR but patient's family comfortable with IV antibiotics and admission.  Discussed with patient's caretakers at the bedside,  all in agreement with plan to move forward with IV antibiotics and inpatient evaluation.  Case discussed with hospitalist Dr. Tressia Miners      ____________________________________________   FINAL CLINICAL IMPRESSION(S) / ED DIAGNOSES  Final diagnoses:  Community acquired pneumonia, unspecified laterality  Sepsis, due to unspecified organism, unspecified whether acute organ dysfunction present [facility])        Note:  This document was prepared using Dragon voice recognition software and may include unintentional dictation errors       Delman Kitten, MD 04/21/18 1901

## 2018-04-21 NOTE — H&P (Signed)
[place] at [place]: Katelyn [name]    MR#:  [id number]  DATE OF BIRTH:  22-Feb-1924  DATE OF ADMISSION:  04/21/2018  PRIMARY CARE PHYSICIAN: Abelardo Diesel, NP (Inactive)   REQUESTING/REFERRING PHYSICIAN: Dr. Delman Kitten  CHIEF COMPLAINT:   Chief Complaint  Patient presents with  . Hypoxia  . Abdominal Pain    HISTORY OF PRESENT ILLNESS:  Katelyn [name]  is a 85 y.o. female with a known history of prior stroke, CKD stage III, osteoarthritis, peripheral vascular disease on Eliquis after intervention, generalized weakness presents to hospital from home secondary to fevers, cough for 2 days. Patient lives at home with her family, she is wheelchair-bound at baseline due to prior strokes and weakness.  [facility].  She has been having cough for almost 10 days.  Cold and congestion last week.  This morning she was having fevers and chills and was nauseous and complained of worsening shortness of breath. In the ED she was noted to be hypoxic, with sats in 80% on room air.  Elevated WBC, low-grade fever of 100.5 F and elevated lactic acid.  Chest x-ray revealing bibasilar infiltrates.  PAST MEDICAL HISTORY:   Past Medical History:  Diagnosis Date  . Abdominal pain   . Anxiety   . Atherosclerosis   . Chronic kidney disease    stage 3  . Colon polyps   . Constipation   . Cough with expectoration   . Dependent edema bilateral legs  . Gait abnormality   . Gout   . Hearing loss   . Hearing loss   . Hemorrhoids   . Hypertension   . Incontinence   . Osteoarthritis   . Osteoarthritis   . Osteoarthritis   . Paget's disease of bone   . Peripheral vascular disease ([place])   . Pneumonia   . Proteinuria   . Stroke [facility])     PAST SURGICAL HISTORY:   Past Surgical History:  Procedure Laterality Date  . PARTIAL HYSTERECTOMY    . PERIPHERAL VASCULAR CATHETERIZATION N/A 01/13/2015   Procedure:  Abdominal Aortogram w/Lower Extremity;  Surgeon: Algernon Huxley, MD;  Location: [place] CV LAB;  Service: Cardiovascular;  Laterality: N/A;  . PERIPHERAL VASCULAR CATHETERIZATION  01/13/2015   Procedure: Lower Extremity Intervention;  Surgeon: Algernon Huxley, MD;  Location: [place] CV LAB;  Service: Cardiovascular;;    SOCIAL HISTORY:   Social History   Tobacco Use  . Smoking status: Never Smoker  . Smokeless tobacco: Never Used  Substance Use Topics  . Alcohol use: No    Alcohol/week: 0.0 standard drinks    FAMILY HISTORY:   Family History  Problem Relation Age of Onset  . Diabetes Mother   . Colon cancer Father   . Cancer Sister   . Cancer Sister   . Cancer Sister     DRUG ALLERGIES:   Allergies  Allergen Reactions  . Bee Venom Anaphylaxis    REVIEW OF SYSTEMS:   Review of Systems  Constitutional: Positive for chills, fever and malaise/fatigue. Negative for weight loss.  HENT: Negative for ear discharge, ear pain, hearing loss, nosebleeds and tinnitus.   Eyes: Negative for blurred vision, double vision and photophobia.  Respiratory: Positive for cough and shortness of breath. Negative for hemoptysis and wheezing.   Cardiovascular: Negative for chest pain, palpitations, orthopnea and leg swelling.  Gastrointestinal: Positive for nausea and vomiting. Negative for  abdominal pain, constipation, diarrhea, heartburn and melena.  Genitourinary: Negative for dysuria, frequency, hematuria and urgency.  Musculoskeletal: Negative for back pain, myalgias and neck pain.  Skin: Negative for rash.  Neurological: Negative for dizziness, tingling, tremors, sensory change, speech change, focal weakness and headaches.  Endo/Heme/Allergies: Does not bruise/bleed easily.  Psychiatric/Behavioral: Negative for depression.    MEDICATIONS AT HOME:   Prior to Admission medications   Medication Sig Start Date End Date Taking? Authorizing Provider  acetaminophen (TYLENOL) 325 MG  tablet Take 2 tablets (650 mg total) by mouth every 6 (six) hours as needed for mild pain (or Fever >/= 101). 01/29/17   Gladstone Lighter, MD  amLODipine (NORVASC) 5 MG tablet Take 1 tablet (5 mg total) by mouth daily. 12/27/14   Nicholes Mango, MD  apixaban (ELIQUIS) 2.5 MG TABS tablet Take 1 tablet (2.5 mg total) by mouth 2 (two) times daily. 01/13/15   Algernon Huxley, MD  atorvastatin (LIPITOR) 40 MG tablet Take 1 tablet (40 mg total) by mouth daily at 6 PM. 01/29/17   Gladstone Lighter, MD  cetirizine (ZYRTEC) 5 MG tablet Take 5 mg at bedtime by mouth.    [provider]  chlorpheniramine-HYDROcodone (TUSSIONEX) 10-8 MG/5ML SUER Take 5 mLs by mouth every 12 (twelve) hours. X 10 days and stop 01/29/17   Gladstone Lighter, MD  docusate sodium (COLACE) 100 MG capsule Take 1 capsule (100 mg total) by mouth 2 (two) times daily. 01/29/17   Gladstone Lighter, MD  donepezil (ARICEPT) 10 MG tablet Take 10 mg daily by mouth.    [provider]  fluticasone (FLONASE) 50 MCG/ACT nasal spray Place 2 sprays into both nostrils daily. 09/03/14   Glean Hess, MD  hydrocortisone (ANUSOL-HC) 2.5 % rectal cream Place 1 application rectally 2 (two) times daily as needed for hemorrhoids.    [provider]  losartan (COZAAR) 50 MG tablet Take 50 mg by mouth daily.    [provider]  methimazole (TAPAZOLE) 10 MG tablet Take 5 mg 2 (two) times daily by mouth.    [provider]  polyethylene glycol (MIRALAX / GLYCOLAX) packet Take 17 g by mouth daily.    [provider]  sennosides-docusate sodium (SENOKOT-S) 8.6-50 MG tablet Take 1 tablet 2 (two) times daily as needed by mouth for constipation.    [provider]      VITAL SIGNS:  Blood pressure (!) 158/78, pulse 96, temperature 98 F (36.7 C), temperature source Oral, resp. rate (!) 26, height 5\' 5"  (1.651 m), weight 66.7 kg, SpO2 93 %.  PHYSICAL EXAMINATION:   Physical Exam  GENERAL:  85  y.o.-year-old elderly patient lying in the bed with no acute distress.  EYES: Pupils equal, round and pinpoint, with sluggish reaction to light and accommodation. No scleral icterus. Extraocular muscles intact.  HEENT: Head atraumatic, normocephalic. Oropharynx and nasopharynx clear.  NECK:  Supple, no jugular venous distention. No thyroid enlargement, no tenderness.  LUNGS: Normal breath sounds bilaterally, no wheezing, rhonchi or crepitation. No use of accessory muscles of respiration.  Fine bibasilar Rales heard CARDIOVASCULAR: S1, S2 normal. No rubs, or gallops.  2/6 systolic murmur present ABDOMEN: Soft, nontender, nondistended. Bowel sounds present. No organomegaly or mass.  EXTREMITIES: No pedal edema, cyanosis, or clubbing.  NEUROLOGIC: Cranial nerves II through XII are intact. Muscle strength 5/5 in both upper extremities and 4/5 in lower extremities, symmetric.Marland Kitchen Sensation intact. Gait not checked.  PSYCHIATRIC: The patient is alert and oriented x 3.  SKIN: No  obvious rash, lesion, or ulcer.   LABORATORY PANEL:   CBC Recent Labs  Lab 04/21/18 1706  WBC 14.0*  HGB 9.8*  HCT 31.1*  PLT 253   ------------------------------------------------------------------------------------------------------------------  Chemistries  Recent Labs  Lab 04/21/18 1706  NA 139  K 4.2  CL 105  CO2 27  GLUCOSE 138*  BUN 27*  CREATININE 1.15*  CALCIUM 9.0  AST 15  ALT 9  ALKPHOS 53  BILITOT 0.7   ------------------------------------------------------------------------------------------------------------------  Cardiac Enzymes Recent Labs  Lab 04/21/18 1706  TROPONINI 0.03*   ------------------------------------------------------------------------------------------------------------------  RADIOLOGY:  Dg Chest 2 View  Result Date: 04/21/2018 CLINICAL DATA:  Fever and fatigue EXAM: CHEST - 2 VIEW COMPARISON:  01/27/2017, 10/13/2014 FINDINGS: Patchy opacities at the bases  consistent with pneumonia and or aspiration. No pleural effusion. Borderline cardiomegaly with aortic atherosclerosis. Left paratracheal mass with deviation of the trachea to the right. No pneumothorax. IMPRESSION: 1. Patchy bibasilar infiltrates. 2. Borderline to mild cardiomegaly 3. Left paratracheal mass presumably due to thyroidal enlargement. This finding is unchanged. Electronically Signed   By: Donavan Foil M.D.   On: 04/21/2018 17:39    EKG:   Orders placed or performed during the hospital encounter of 04/21/18  . EKG 12-Lead  . EKG 12-Lead  . ED EKG 12-Lead  . ED EKG 12-Lead    IMPRESSION AND PLAN:   Jenney Brester  is a 85 y.o. female with a known history of prior stroke, CKD stage III, osteoarthritis, peripheral vascular disease on Eliquis after intervention, generalized weakness presents to hospital from home secondary to fevers, cough for 2 days.  1.  Sepsis-secondary to community-acquired pneumonia -Admit, blood cultures. -Started on Rocephin and azithromycin. -Supplemental oxygen as needed -Follow-up lactic acid levels. -UA is pending  2.  History of peripheral vascular disease status post intervention.  Continue statin and Eliquis.  Outpatient follow-up.  No active symptoms at this time  3.  Hypertension-known Norvasc, losartan  4.  Hyperthyroidism-follow-up TSH.  Patient on methimazole  5.  DVT prophylaxis-already on Eliquis  Physical therapy consulted. Updated family at bedside   All the records are reviewed and case discussed with ED provider. Management plans discussed with the patient, family and they are in agreement.  CODE STATUS: DNR  TOTAL TIME TAKING CARE OF THIS PATIENT: 51 minutes.    Gladstone Lighter M.D on 04/21/2018 at 6:45 PM  Between 7am to 6pm - Pager - [phone]  After 6pm go to [email or web address] - password [email or web address] [place] [facility]  Office  [phone]  CC: Primary care physician; Abelardo Diesel, NP (Inactive)

## 2018-04-21 NOTE — ED Notes (Signed)
Pt given juice to drink. MD aware.

## 2018-04-22 DIAGNOSIS — L8990 Pressure ulcer of unspecified site, unspecified stage: Secondary | ICD-10-CM

## 2018-04-22 LAB — TROPONIN I
Troponin I: 0.04 ng/mL (ref <0.03)
Troponin I: 0.04 ng/mL (ref <0.03)

## 2018-04-22 LAB — BASIC METABOLIC PANEL
Anion gap: 5 (ref 5–15)
BUN: 23 mg/dL (ref 8–23)
CO2: 25 mmol/L (ref 22–32)
Calcium: 8.2 mg/dL — ABNORMAL LOW (ref 8.9–10.3)
Chloride: 109 mmol/L (ref 98–111)
Creatinine, Ser: 0.94 mg/dL (ref 0.44–1.00)
GFR calc Af Amer: >60 mL/min (ref >60)
GFR calc non Af Amer: 53 mL/min — ABNORMAL LOW (ref >60)
Glucose, Bld: 96 mg/dL (ref 70–99)
Potassium: 4 mmol/L (ref 3.5–5.1)
Sodium: 139 mmol/L (ref 135–145)

## 2018-04-22 LAB — CBC
HCT: 28 % — ABNORMAL LOW (ref 36.0–46.0)
Hemoglobin: 9 g/dL — ABNORMAL LOW (ref 12.0–15.0)
MCH: 31.1 pg (ref 26.0–34.0)
MCHC: 32.1 g/dL (ref 30.0–36.0)
MCV: 96.9 fL (ref 80.0–100.0)
NRBC: 0 % (ref 0.0–0.2)
Platelets: 210 10*3/uL (ref 150–400)
RBC: 2.89 MIL/uL — ABNORMAL LOW (ref 3.87–5.11)
RDW: 14.1 % (ref 11.5–15.5)
WBC: 21.2 10*3/uL — ABNORMAL HIGH (ref 4.0–10.5)

## 2018-04-22 LAB — LACTIC ACID, PLASMA: Lactic Acid, Venous: 1.7 mmol/L (ref 0.5–1.9)

## 2018-04-22 MED ORDER — GUAIFENESIN ER 600 MG PO TB12
600.0000 mg | ORAL_TABLET | Freq: Two times a day (BID) | ORAL | Status: DC
  Administered 2018-04-22 – 2018-04-23 (×3): 600 mg via ORAL
  Filled 2018-04-22 (×3): qty 1

## 2018-04-22 MED ORDER — IPRATROPIUM-ALBUTEROL 0.5-2.5 (3) MG/3ML IN SOLN
3.0000 mL | Freq: Four times a day (QID) | RESPIRATORY_TRACT | Status: DC
  Administered 2018-04-22 – 2018-04-23 (×5): 3 mL via RESPIRATORY_TRACT
  Filled 2018-04-22 (×5): qty 3

## 2018-04-22 MED ORDER — BUDESONIDE 0.5 MG/2ML IN SUSP
0.5000 mg | Freq: Two times a day (BID) | RESPIRATORY_TRACT | Status: DC
  Administered 2018-04-22 – 2018-04-23 (×3): 0.5 mg via RESPIRATORY_TRACT
  Filled 2018-04-22 (×3): qty 2

## 2018-04-22 MED ORDER — POLYVINYL ALCOHOL 1.4 % OP SOLN
1.0000 [drp] | Freq: Three times a day (TID) | OPHTHALMIC | Status: DC | PRN
  Administered 2018-04-22 (×2): 1 [drp] via OPHTHALMIC
  Filled 2018-04-22: qty 15

## 2018-04-22 NOTE — Progress Notes (Signed)
[facility] [place] NAME: Katelyn [name]    MR#:  [id number]  Terry:  [birth year]  SUBJECTIVE:   Patient here due to shortness of breath cough fevers and noted to have pneumonia.  Daughter is at bedside.  Patient feels a bit better today.  White cell count is increased.  REVIEW OF SYSTEMS:    Review of Systems  Constitutional: Positive for chills and fever.  HENT: Negative for congestion and tinnitus.   Eyes: Negative for blurred vision and double vision.  Respiratory: Negative for cough, shortness of breath and wheezing.   Cardiovascular: Negative for chest pain, orthopnea and PND.  Gastrointestinal: Negative for abdominal pain, diarrhea, nausea and vomiting.  Genitourinary: Negative for dysuria and hematuria.  Neurological: Positive for weakness (generalized). Negative for dizziness, sensory change and focal weakness.  All other systems reviewed and are negative.   Nutrition: Heart Healthy Tolerating Diet: Yes Tolerating PT: Await Eval.   DRUG ALLERGIES:   Allergies  Allergen Reactions  . Bee Venom Anaphylaxis    VITALS:  Blood pressure (!) 161/61, pulse 81, temperature 98.2 F (36.8 C), temperature source Oral, resp. rate 20, height 5\' 5"  (1.651 m), weight 66.7 kg, SpO2 99 %.  PHYSICAL EXAMINATION:   Physical Exam  GENERAL:  85 y.o.-year-old patient lying in bed in no acute distress.  EYES: Pupils equal, round, reactive to light and accommodation. No scleral icterus. Extraocular muscles intact.  HEENT: Head atraumatic, normocephalic. Oropharynx and nasopharynx clear.  NECK:  Supple, no jugular venous distention. No thyroid enlargement, no tenderness.  LUNGS: Normal breath sounds bilaterally, no wheezing, rales, rhonchi. No use of accessory muscles of respiration.  CARDIOVASCULAR: S1, S2 normal. No murmurs, rubs, or gallops.  ABDOMEN: Soft, nontender, nondistended. Bowel sounds present. No organomegaly or mass.   EXTREMITIES: No cyanosis, clubbing or edema b/l.    NEUROLOGIC: Cranial nerves II through XII are intact. No focal Motor or sensory deficits b/l. Globally weak.    PSYCHIATRIC: The patient is alert and oriented x 3.  SKIN: No obvious rash, lesion, or ulcer.    LABORATORY PANEL:   CBC Recent Labs  Lab 04/22/18 0237  WBC 21.2*  HGB 9.0*  HCT 28.0*  PLT 210   ------------------------------------------------------------------------------------------------------------------  Chemistries  Recent Labs  Lab 04/21/18 1706 04/22/18 0237  NA 139 139  K 4.2 4.0  CL 105 109  CO2 27 25  GLUCOSE 138* 96  BUN 27* 23  CREATININE 1.15* 0.94  CALCIUM 9.0 8.2*  AST 15  --   ALT 9  --   ALKPHOS 53  --   BILITOT 0.7  --    ------------------------------------------------------------------------------------------------------------------  Cardiac Enzymes Recent Labs  Lab 04/22/18 0908  TROPONINI 0.04*   ------------------------------------------------------------------------------------------------------------------  RADIOLOGY:  Dg Chest 2 View  Result Date: 04/21/2018 CLINICAL DATA:  Fever and fatigue EXAM: CHEST - 2 VIEW COMPARISON:  01/27/2017, 10/13/2014 FINDINGS: Patchy opacities at the bases consistent with pneumonia and or aspiration. No pleural effusion. Borderline cardiomegaly with aortic atherosclerosis. Left paratracheal mass with deviation of the trachea to the right. No pneumothorax. IMPRESSION: 1. Patchy bibasilar infiltrates. 2. Borderline to mild cardiomegaly 3. Left paratracheal mass presumably due to thyroidal enlargement. This finding is unchanged. Electronically Signed   By: Donavan Foil M.D.   On: 04/21/2018 17:39     ASSESSMENT AND PLAN:   Katelyn [name]  is a 85 y.o. female with a known history of prior stroke, CKD stage III,  osteoarthritis, peripheral vascular disease on Eliquis after intervention, generalized weakness presents to hospital from home  secondary to fevers, cough for 2 days.  1.  Sepsis-secondary to community-acquired pneumonia - Patient is afebrile and hemodynamically stable.  Continue Rocephin, Zithromax.  Lactic acid level has improved.  2.  History of peripheral vascular disease status post intervention.  Continue statin and Eliquis.  Outpatient follow-up.  No active symptoms at this time  3.  Hypertension-known Norvasc, losartan - BP stable.   4.  Hyperthyroidism- Patient on methimazole - TSH normal.   5. Leukocytosis - due to # 1.  - will cont. To monitor.     All the records are reviewed and case discussed with Care Management/Social Worker. Management plans discussed with the patient, family and they are in agreement.  CODE STATUS: DNR  DVT Prophylaxis: Eliquis  TOTAL TIME TAKING CARE OF THIS PATIENT: 30 minutes.   POSSIBLE D/C IN 1-2 DAYS, DEPENDING ON CLINICAL CONDITION.   Katelyn [name] M.D on 04/22/2018 at 1:03 PM  Between 7am to 6pm - Pager - [phone]  After 6pm go to [email or web address] - Proofreader  [facility]  Office  [phone]  CC: Primary care physician; Katelyn Diesel, NP (Inactive)

## 2018-04-22 NOTE — Plan of Care (Signed)
  Problem: Clinical Measurements: Goal: Cardiovascular complication will be avoided Outcome: Progressing   Problem: Safety: Goal: Ability to remain free from injury will improve Outcome: Progressing   Problem: Skin Integrity: Goal: Risk for impaired skin integrity will decrease Outcome: Progressing   Problem: Clinical Measurements: Goal: Ability to maintain a body temperature in the normal range will improve Outcome: Progressing   Problem: Respiratory: Goal: Ability to maintain adequate ventilation will improve Outcome: Progressing Goal: Ability to maintain a clear airway will improve Outcome: Progressing

## 2018-04-22 NOTE — Evaluation (Signed)
Physical Therapy Evaluation Patient Details Name: Katelyn [name] MRN: [id number] DOB: Oct 12, 1923 Today's Date: 04/22/2018   History of Present Illness  presented to ER secondary to fever, cough x2 days; admitted for management of sepsis related to CAP.  Clinical Impression  Upon evaluation, patient alert and oriented; follows commands, but requires frequent encouragement for full participation, active effort with session.  Patient generally weak and deconditioned, but demonstrates strength and ROM grossly functional for basic transfers and mobility throughout session.  Currently requiring min/mod assist for bed mobility and bed/chair transfer (squat pivot).  Does require UE support for external stabilization with transfers; consistent assist from +1 for lift off, lateral movement and overall balance with transfer. Would benefit from skilled PT to address above deficits and promote optimal return to PLOF; Recommend transition to HHPT/continued PACE services upon discharge from acute hospitalization.     Follow Up Recommendations Home health PT(continued support/participation with PACE services)    Equipment Recommendations       Recommendations for Other Services       Precautions / Restrictions Precautions Precautions: Fall Restrictions Weight Bearing Restrictions: No      Mobility  Bed Mobility Overal bed mobility: Needs Assistance Bed Mobility: Supine to Sit     Supine to sit: Mod assist;Min assist     General bed mobility comments: limited active participation with movement transition; generally disinterested in evaluation at times  Transfers Overall transfer level: Needs assistance   Transfers: Squat Pivot Transfers     Squat pivot transfers: Mod assist     General transfer comment: requires UE support for external stabilization, min/mod assist for lift off and overall balance/safety  Ambulation/Gait             General Gait Details: deferred;  non-ambulatory at baseline  Stairs            Wheelchair Mobility    Modified Rankin (Stroke Patients Only)       Balance Overall balance assessment: Needs assistance Sitting-balance support: No upper extremity supported;Feet supported Sitting balance-Leahy Scale: Fair                                       Pertinent Vitals/Pain Pain Assessment: No/denies pain    Home Living Family/patient expects to be discharged to:: Private residence Living Arrangements: Children Available Help at Discharge: Family;Available 24 hours/day Type of Home: House Home Access: Ramped entrance       Home Equipment: Wheelchair - manual;Hospital bed      Prior Function Level of Independence: Needs assistance         Comments: WC level as primary mobility, performing squat pivot transfers between seating surfaces with +1 assist from family.     Hand Dominance        Extremity/Trunk Assessment   Upper Extremity Assessment Upper Extremity Assessment: Generalized weakness(grossly 3+ to 4-/5 throughout)    Lower Extremity Assessment Lower Extremity Assessment: Generalized weakness(grossly 3+ to 4-/5 throughout)       Communication   Communication: HOH  Cognition Arousal/Alertness: Awake/alert Behavior During Therapy: WFL for tasks assessed/performed Overall Cognitive Status: Within Functional Limits for tasks assessed                                 General Comments: oriented to basic information; follows simple commands      General Comments  Exercises Other Exercises Other Exercises: Rolling bilat, min/mod assist, for hygiene, clothing management after incontinent bladder episode.  Step by step cuing for active participation and self-initiation of task.   Assessment/Plan    PT Assessment Patient needs continued PT services  PT Problem List Decreased strength;Decreased activity tolerance;Decreased balance;Decreased  mobility;Decreased knowledge of use of DME;Decreased safety awareness;Decreased knowledge of precautions       PT Treatment Interventions DME instruction;Functional mobility training;Therapeutic activities;Therapeutic exercise;Balance training;Patient/family education    PT Goals (Current goals can be found in the Care Plan section)  Acute Rehab PT Goals Patient Stated Goal: to get better and go home PT Goal Formulation: With patient/family Time For Goal Achievement: 05/06/18 Potential to Achieve Goals: Fair    Frequency Min 2X/week   Barriers to discharge        Co-evaluation               AM-PAC PT "6 Clicks" Mobility  Outcome Measure Help needed turning from your back to your side while in a flat bed without using bedrails?: A Lot Help needed moving from lying on your back to sitting on the side of a flat bed without using bedrails?: A Lot Help needed moving to and from a bed to a chair (including a wheelchair)?: A Lot Help needed standing up from a chair using your arms (e.g., wheelchair or bedside chair)?: A Lot Help needed to walk in hospital room?: Total Help needed climbing 3-5 steps with a railing? : Total 6 Click Score: 10    End of Session   Activity Tolerance: Patient tolerated treatment well Patient left: in chair;with call bell/phone within reach;with chair alarm set;with family/visitor present   PT Visit Diagnosis: Muscle weakness (generalized) (M62.81)    Time: 1448-1856 PT Time Calculation (min) (ACUTE ONLY): 21 min   Charges:   PT Evaluation $PT Eval Moderate Complexity: 1 Mod PT Treatments $Therapeutic Activity: 8-22 mins        Wai Litt H. Owens Shark, PT, DPT, NCS 04/22/18, 2:06 PM [phone]

## 2018-04-22 NOTE — Plan of Care (Signed)
Education provided to pt and family regarding goals for discharge.  Reinforced the importance of DB&C.  Pt return demonstrated DB&C, and produced frothy white sputum.  Transferred bed to chair with PT.  Pt tolerating OOB in chair well.

## 2018-04-23 LAB — CBC
HCT: 28.8 % — ABNORMAL LOW (ref 36.0–46.0)
HEMOGLOBIN: 9.3 g/dL — AB (ref 12.0–15.0)
MCH: 30.9 pg (ref 26.0–34.0)
MCHC: 32.3 g/dL (ref 30.0–36.0)
MCV: 95.7 fL (ref 80.0–100.0)
Platelets: 208 10*3/uL (ref 150–400)
RBC: 3.01 MIL/uL — ABNORMAL LOW (ref 3.87–5.11)
RDW: 14.1 % (ref 11.5–15.5)
WBC: 10 10*3/uL (ref 4.0–10.5)
nRBC: 0 % (ref 0.0–0.2)

## 2018-04-23 LAB — BASIC METABOLIC PANEL
Anion gap: 7 (ref 5–15)
BUN: 18 mg/dL (ref 8–23)
CO2: 24 mmol/L (ref 22–32)
Calcium: 8.5 mg/dL — ABNORMAL LOW (ref 8.9–10.3)
Chloride: 108 mmol/L (ref 98–111)
Creatinine, Ser: 0.83 mg/dL (ref 0.44–1.00)
GFR calc Af Amer: >60 mL/min (ref >60)
GFR calc non Af Amer: >60 mL/min (ref >60)
Glucose, Bld: 97 mg/dL (ref 70–99)
POTASSIUM: 3.7 mmol/L (ref 3.5–5.1)
Sodium: 139 mmol/L (ref 135–145)

## 2018-04-23 MED ORDER — GUAIFENESIN-DM 100-10 MG/5ML PO SYRP: 5.0000 mL | ORAL_SOLUTION | Freq: Four times a day (QID) | ORAL | 0 refills | Status: AC | PRN

## 2018-04-23 MED ORDER — SENNA-DOCUSATE SODIUM 8.6-50 MG PO TABS: 1.0000 | ORAL_TABLET | Freq: Two times a day (BID) | ORAL | Status: AC | PRN

## 2018-04-23 MED ORDER — DONEPEZIL HCL 10 MG PO TABS: 10.0000 mg | ORAL_TABLET | Freq: Every day | ORAL | Status: DC

## 2018-04-23 MED ORDER — LEVOFLOXACIN 500 MG PO TABS: 500.0000 mg | ORAL_TABLET | Freq: Every day | ORAL | 0 refills | Status: AC

## 2018-04-23 NOTE — Discharge Summary (Signed)
[facility] [place] NAME: Katelyn [name]    MR#:  [id number]  DATE OF BIRTH:  [birth year]  DATE OF ADMISSION:  04/21/2018 ADMITTING PHYSICIAN: Gladstone Lighter, MD  DATE OF DISCHARGE: 04/23/2018  PRIMARY CARE PHYSICIAN: Katelyn Diesel, NP (Inactive)    ADMISSION DIAGNOSIS:  Community acquired pneumonia, unspecified laterality [J18.9] Sepsis, due to unspecified organism, unspecified whether acute organ dysfunction present ([place]) [A41.9]  DISCHARGE DIAGNOSIS:  Active Problems:   CAP (community acquired pneumonia)   Pressure injury of skin   SECONDARY DIAGNOSIS:   Past Medical History:  Diagnosis Date  . Abdominal pain   . Anxiety   . Atherosclerosis   . Chronic kidney disease    stage 3  . Colon polyps   . Constipation   . Cough with expectoration   . Dependent edema bilateral legs  . Gait abnormality   . Gout   . Hearing loss   . Hearing loss   . Hemorrhoids   . Hypertension   . Incontinence   . Osteoarthritis   . Osteoarthritis   . Osteoarthritis   . Paget's disease of bone   . Peripheral vascular disease ([place])   . Pneumonia   . Proteinuria   . Stroke [facility])     HOSPITAL COURSE:   ElizabethPinnixis a85 y.o.femalewith a known history of prior stroke, CKD stage III, osteoarthritis, peripheral vascular disease on Eliquis after intervention, generalized weakness presents to hospital from home secondary to fevers, cough for 2 days.  1. Sepsis-secondary to community-acquired pneumonia -Patient white cell count has normalized, lactic acid is also improved.  She is afebrile and hemodynamically stable.  On the hospital patient was treated with IV ceftriaxone, Zithromax.  She is now being discharged on oral Levaquin for additional 5 days.  2.History of peripheral vascular disease status post intervention. Continue statin and Eliquis. Outpatient follow-up. No active symptoms at this time  3. Hypertension- pt.  Will cont. Norvasc, losartan, and Toprol - BP stable.   4. Hyperthyroidism- Patient will cont. Her methimazole - TSH normal.   5. Leukocytosis - due to # 1 and now normalized.   6. Hx of Dementia - cont. Namenda, Ativan as needed.   Pt. Is followed by the [facility] program and will d/c with Home health PT, RN, Aide, Social Work.   DISCHARGE CONDITIONS:   Stable.   CONSULTS OBTAINED:    DRUG ALLERGIES:   Allergies  Allergen Reactions  . Bee Venom Anaphylaxis    DISCHARGE MEDICATIONS:   Allergies as of 04/23/2018      Reactions   Bee Venom Anaphylaxis      Medication List    STOP taking these medications   chlorpheniramine-HYDROcodone 10-8 MG/5ML Suer Commonly known as:  TUSSIONEX   docusate sodium 100 MG capsule Commonly known as:  COLACE     TAKE these medications   acetaminophen 650 MG CR tablet Commonly known as:  TYLENOL Take 1,300 mg by mouth 2 (two) times daily as needed for pain.   acetaminophen 325 MG tablet Commonly known as:  TYLENOL Take 2 tablets (650 mg total) by mouth every 6 (six) hours as needed for mild pain (or Fever >/= 101).   alum & mag hydroxide-simeth 938-101-75 MG/5ML suspension Commonly known as:  MAALOX PLUS Take 5 mLs by mouth every 4 (four) hours as needed for indigestion.   amLODipine 5 MG tablet Commonly known as:  NORVASC Take 1 tablet (5 mg total) by mouth daily.   apixaban  2.5 MG Tabs tablet Commonly known as:  ELIQUIS Take 1 tablet (2.5 mg total) by mouth 2 (two) times daily.   atorvastatin 40 MG tablet Commonly known as:  LIPITOR Take 1 tablet (40 mg total) by mouth daily at 6 PM.   atropine 1 % ophthalmic solution Place 2 drops under the tongue every hour as needed (excessive secretions).   donepezil 10 MG tablet Commonly known as:  ARICEPT Take 1 tablet (10 mg total) by mouth daily.   fluticasone 50 MCG/ACT nasal spray Commonly known as:  FLONASE Place 2 sprays into both nostrils daily.    guaiFENesin-dextromethorphan 100-10 MG/5ML syrup Commonly known as:  ROBITUSSIN DM Take 5 mLs by mouth every 6 (six) hours as needed for cough.   hydrocortisone 2.5 % rectal cream Commonly known as:  ANUSOL-HC Place 1 application rectally 2 (two) times daily as needed for hemorrhoids.   levofloxacin 500 MG tablet Commonly known as:  LEVAQUIN Take 1 tablet (500 mg total) by mouth daily for 7 days.   LORazepam 0.5 MG tablet Commonly known as:  ATIVAN Take 0.5 mg by mouth every 8 (eight) hours as needed for anxiety.   LORazepam 2 MG/ML concentrated solution Commonly known as:  ATIVAN Take 0.5 mg by mouth every 2 (two) hours as needed for anxiety (agitation).   losartan 50 MG tablet Commonly known as:  COZAAR Take 50 mg by mouth daily.   methimazole 5 MG tablet Commonly known as:  TAPAZOLE Take 2.5 mg by mouth 2 (two) times daily.   metoprolol succinate 25 MG 24 hr tablet Commonly known as:  TOPROL-XL Take 25 mg by mouth daily.   morphine CONCENTRATE 10 mg / 0.5 ml concentrated solution Take 4 mg by mouth every 2 (two) hours as needed for severe pain.   ondansetron 4 MG disintegrating tablet Commonly known as:  ZOFRAN-ODT Take 4 mg by mouth every 4 (four) hours as needed for nausea or vomiting.   oxyCODONE 5 MG immediate release tablet Commonly known as:  Oxy IR/ROXICODONE Take 2.5-5 mg by mouth every 6 (six) hours as needed for severe pain.   polyethylene glycol packet Commonly known as:  MIRALAX / GLYCOLAX Take 17 g by mouth daily.   sennosides-docusate sodium 8.6-50 MG tablet Commonly known as:  SENOKOT-S Take 1 tablet by mouth 2 (two) times daily as needed for constipation.   SYSTANE 0.4-0.3 % Soln Generic drug:  Polyethyl Glycol-Propyl Glycol Place 1 drop into both eyes every 4 (four) hours as needed (dry eyes).   vitamin A & D ointment Apply 1 application topically 2 (two) times daily as needed for dry skin (itchy skin).   Zinc Oxide 13 % Crea Place 1  application rectally 4 (four) times daily as needed (painful hemorrhoids).         DISCHARGE INSTRUCTIONS:   DIET:  Cardiac diet  DISCHARGE CONDITION:  Stable  ACTIVITY:  Activity as tolerated  OXYGEN:  Home Oxygen: No.   Oxygen Delivery: room air  DISCHARGE LOCATION:  Home with Home health PT, RN, Aide, Social.  Followed by pace program.    If you experience worsening of your admission symptoms, develop shortness of breath, life threatening emergency, suicidal or homicidal thoughts you must seek medical attention immediately by calling 911 or calling your MD immediately  if symptoms less severe.  You Must read complete instructions/literature along with all the possible adverse reactions/side effects for all the Medicines you take and that have been prescribed to you. Take any new  Medicines after you have completely understood and accpet all the possible adverse reactions/side effects.   Please note  You were cared for by a hospitalist during your hospital stay. If you have any questions about your discharge medications or the care you received while you were in the hospital after you are discharged, you can call the unit and asked to speak with the hospitalist on call if the hospitalist that took care of you is not available. Once you are discharged, your primary care physician will handle any further medical issues. Please note that NO REFILLS for any discharge medications will be authorized once you are discharged, as it is imperative that you return to your primary care physician (or establish a relationship with a primary care physician if you do not have one) for your aftercare needs so that they can reassess your need for medications and monitor your lab values.     Today   Feels better.  Family at bedside.  Afebrile, WBC count normalized.  Will d/c home today with Home Health services.   VITAL SIGNS:  Blood pressure (!) 172/85, pulse 90, temperature 98.6 F (37 C),  temperature source Oral, resp. rate 16, height 5\' 5"  (1.651 m), weight 82.1 kg, SpO2 98 %.  I/O:    Intake/Output Summary (Last 24 hours) at 04/23/2018 1405 Last data filed at 04/23/2018 1043 Gross per 24 hour  Intake 240 ml  Output 1425 ml  Net -1185 ml    PHYSICAL EXAMINATION:   GENERAL:  85 y.o.-year-old patient lying in bed in no acute distress.  EYES: Pupils equal, round, reactive to light and accommodation. No scleral icterus. Extraocular muscles intact.  HEENT: Head atraumatic, normocephalic. Oropharynx and nasopharynx clear.  NECK:  Supple, no jugular venous distention. No thyroid enlargement, no tenderness.  LUNGS: Normal breath sounds bilaterally, no wheezing, rales, rhonchi. No use of accessory muscles of respiration.  CARDIOVASCULAR: S1, S2 normal. No murmurs, rubs, or gallops.  ABDOMEN: Soft, nontender, nondistended. Bowel sounds present. No organomegaly or mass.  EXTREMITIES: No cyanosis, clubbing or edema b/l.    NEUROLOGIC: Cranial nerves II through XII are intact. No focal Motor or sensory deficits b/l. Globally weak.    PSYCHIATRIC: The patient is alert and oriented x 3.  SKIN: No obvious rash, lesion, or ulcer.   DATA REVIEW:   CBC Recent Labs  Lab 04/23/18 0402  WBC 10.0  HGB 9.3*  HCT 28.8*  PLT 208    Chemistries  Recent Labs  Lab 04/21/18 1706  04/23/18 0402  NA 139   < > 139  K 4.2   < > 3.7  CL 105   < > 108  CO2 27   < > 24  GLUCOSE 138*   < > 97  BUN 27*   < > 18  CREATININE 1.15*   < > 0.83  CALCIUM 9.0   < > 8.5*  AST 15  --   --   ALT 9  --   --   ALKPHOS 53  --   --   BILITOT 0.7  --   --    < > = values in this interval not displayed.    Cardiac Enzymes Recent Labs  Lab 04/22/18 0908  TROPONINI 0.04*    Microbiology Results  Results for orders placed or performed during the hospital encounter of 04/21/18  Blood Culture (routine x 2)     Status: None (Preliminary result)   Collection Time: 04/21/18  5:06 PM  Result  Value Ref Range Status   Specimen Description BLOOD RIGHT ANTECUBITAL  Final   Special Requests   Final    BOTTLES DRAWN AEROBIC AND ANAEROBIC Blood Culture results may not be optimal due to an excessive volume of blood received in culture bottles   Culture   Final    NO GROWTH 2 DAYS Performed at [facility], [address] [place], [place] [address]    Report Status PENDING  Incomplete  Blood Culture (routine x 2)     Status: None (Preliminary result)   Collection Time: 04/21/18  5:11 PM  Result Value Ref Range Status   Specimen Description BLOOD LEFT ANTECUBITAL  Final   Special Requests   Final    BOTTLES DRAWN AEROBIC AND ANAEROBIC Blood Culture results may not be optimal due to an excessive volume of blood received in culture bottles   Culture   Final    NO GROWTH 2 DAYS Performed at [facility], [address], [place], [place] [address]    Report Status PENDING  Incomplete    RADIOLOGY:  Dg Chest 2 View  Result Date: 04/21/2018 CLINICAL DATA:  Fever and fatigue EXAM: CHEST - 2 VIEW COMPARISON:  01/27/2017, 10/13/2014 FINDINGS: Patchy opacities at the bases consistent with pneumonia and or aspiration. No pleural effusion. Borderline cardiomegaly with aortic atherosclerosis. Left paratracheal mass with deviation of the trachea to the right. No pneumothorax. IMPRESSION: 1. Patchy bibasilar infiltrates. 2. Borderline to mild cardiomegaly 3. Left paratracheal mass presumably due to thyroidal enlargement. This finding is unchanged. Electronically Signed   By: Donavan Foil M.D.   On: 04/21/2018 17:39      Management plans discussed with the patient, family and they are in agreement.  CODE STATUS:     Code Status Orders  (From admission, onward)         Start     Ordered   04/21/18 2103  Do not attempt resuscitation (DNR)  Continuous    Question Answer Comment  In the event of cardiac or respiratory ARREST Do not call a "code blue"   In the event of  cardiac or respiratory ARREST Do not perform Intubation, CPR, defibrillation or ACLS   In the event of cardiac or respiratory ARREST Use medication by any route, position, wound care, and other measures to relive pain and suffering. May use oxygen, suction and manual treatment of airway obstruction as needed for comfort.      04/21/18 2102         TOTAL TIME TAKING CARE OF THIS PATIENT: 40 minutes.    Henreitta Leber M.D on 04/23/2018 at 2:05 PM  Between 7am to 6pm - Pager - [phone]  After 6pm go to [email or web address] - Proofreader  [facility]  Office  [phone]  CC: Primary care physician; Katelyn Diesel, NP (Inactive)

## 2018-04-23 NOTE — Progress Notes (Signed)
IV and tele removed from patient. Discharge instructions given to patient and daughters along with hard copy prescriptions. Verbalized understanding. [facility] to transport patient home. No acute distress at this time.

## 2018-04-26 LAB — CULTURE, BLOOD (ROUTINE X 2)
Culture: NO GROWTH
Culture: NO GROWTH

## 2018-06-10 ENCOUNTER — Encounter (INDEPENDENT_AMBULATORY_CARE_PROVIDER_SITE_OTHER): Payer: Medicare (Managed Care)

## 2018-06-10 ENCOUNTER — Ambulatory Visit (INDEPENDENT_AMBULATORY_CARE_PROVIDER_SITE_OTHER): Payer: Medicare (Managed Care) | Admitting: Vascular Surgery

## 2019-05-31 ENCOUNTER — Encounter: Payer: Self-pay | Admitting: Emergency Medicine

## 2019-05-31 ENCOUNTER — Inpatient Hospital Stay: Payer: Medicare (Managed Care)

## 2019-05-31 ENCOUNTER — Other Ambulatory Visit: Payer: Self-pay

## 2019-05-31 ENCOUNTER — Emergency Department: Payer: Medicare (Managed Care)

## 2019-05-31 ENCOUNTER — Inpatient Hospital Stay
Admission: EM | Admit: 2019-05-31 | Discharge: 2019-06-02 | DRG: 312 | Disposition: A | Discharge status: Home Health | Payer: Medicare (Managed Care) | Attending: Internal Medicine | Admitting: Internal Medicine

## 2019-05-31 DIAGNOSIS — T17908A Unspecified foreign body in respiratory tract, part unspecified causing other injury, initial encounter: Secondary | ICD-10-CM

## 2019-05-31 DIAGNOSIS — I4891 Unspecified atrial fibrillation: Secondary | ICD-10-CM | POA: Diagnosis present

## 2019-05-31 DIAGNOSIS — F0390 Unspecified dementia without behavioral disturbance: Secondary | ICD-10-CM | POA: Diagnosis present

## 2019-05-31 DIAGNOSIS — R29810 Facial weakness: Secondary | ICD-10-CM | POA: Diagnosis present

## 2019-05-31 DIAGNOSIS — R55 Syncope and collapse: Principal | ICD-10-CM | POA: Diagnosis present

## 2019-05-31 DIAGNOSIS — R1310 Dysphagia, unspecified: Secondary | ICD-10-CM | POA: Diagnosis present

## 2019-05-31 DIAGNOSIS — E0590 Thyrotoxicosis, unspecified without thyrotoxic crisis or storm: Secondary | ICD-10-CM | POA: Diagnosis present

## 2019-05-31 DIAGNOSIS — I129 Hypertensive chronic kidney disease with stage 1 through stage 4 chronic kidney disease, or unspecified chronic kidney disease: Secondary | ICD-10-CM | POA: Diagnosis present

## 2019-05-31 DIAGNOSIS — Z90711 Acquired absence of uterus with remaining cervical stump: Secondary | ICD-10-CM

## 2019-05-31 DIAGNOSIS — Z66 Do not resuscitate: Secondary | ICD-10-CM | POA: Diagnosis present

## 2019-05-31 DIAGNOSIS — D72829 Elevated white blood cell count, unspecified: Secondary | ICD-10-CM | POA: Diagnosis present

## 2019-05-31 DIAGNOSIS — Z79891 Long term (current) use of opiate analgesic: Secondary | ICD-10-CM

## 2019-05-31 DIAGNOSIS — H9190 Unspecified hearing loss, unspecified ear: Secondary | ICD-10-CM | POA: Diagnosis present

## 2019-05-31 DIAGNOSIS — Z20822 Contact with and (suspected) exposure to covid-19: Secondary | ICD-10-CM | POA: Diagnosis present

## 2019-05-31 DIAGNOSIS — Z8673 Personal history of transient ischemic attack (TIA), and cerebral infarction without residual deficits: Secondary | ICD-10-CM

## 2019-05-31 DIAGNOSIS — Z8719 Personal history of other diseases of the digestive system: Secondary | ICD-10-CM

## 2019-05-31 DIAGNOSIS — F419 Anxiety disorder, unspecified: Secondary | ICD-10-CM | POA: Diagnosis present

## 2019-05-31 DIAGNOSIS — I6381 Other cerebral infarction due to occlusion or stenosis of small artery: Secondary | ICD-10-CM | POA: Diagnosis present

## 2019-05-31 DIAGNOSIS — E0520 Thyrotoxicosis with toxic multinodular goiter without thyrotoxic crisis or storm: Secondary | ICD-10-CM | POA: Diagnosis present

## 2019-05-31 DIAGNOSIS — I10 Essential (primary) hypertension: Secondary | ICD-10-CM | POA: Diagnosis present

## 2019-05-31 DIAGNOSIS — Z833 Family history of diabetes mellitus: Secondary | ICD-10-CM | POA: Diagnosis not present

## 2019-05-31 DIAGNOSIS — Z800 Family history of malignant neoplasm of digestive organs: Secondary | ICD-10-CM

## 2019-05-31 DIAGNOSIS — R319 Hematuria, unspecified: Secondary | ICD-10-CM

## 2019-05-31 DIAGNOSIS — I639 Cerebral infarction, unspecified: Secondary | ICD-10-CM | POA: Diagnosis present

## 2019-05-31 DIAGNOSIS — Z7901 Long term (current) use of anticoagulants: Secondary | ICD-10-CM | POA: Diagnosis not present

## 2019-05-31 DIAGNOSIS — N184 Chronic kidney disease, stage 4 (severe): Secondary | ICD-10-CM | POA: Diagnosis present

## 2019-05-31 DIAGNOSIS — R4182 Altered mental status, unspecified: Secondary | ICD-10-CM | POA: Diagnosis not present

## 2019-05-31 DIAGNOSIS — Z79899 Other long term (current) drug therapy: Secondary | ICD-10-CM | POA: Diagnosis not present

## 2019-05-31 DIAGNOSIS — N183 Chronic kidney disease, stage 3 unspecified: Secondary | ICD-10-CM | POA: Diagnosis present

## 2019-05-31 LAB — URINALYSIS, ROUTINE W REFLEX MICROSCOPIC
Bilirubin Urine: NEGATIVE
Glucose, UA: NEGATIVE mg/dL
Ketones, ur: NEGATIVE mg/dL
Leukocytes,Ua: NEGATIVE
Nitrite: NEGATIVE
Protein, ur: 30 mg/dL — AB
RBC / HPF: >50 RBC/hpf — ABNORMAL HIGH (ref 0–5)
Specific Gravity, Urine: 1.017 (ref 1.005–1.030)
Squamous Epithelial / HPF: NONE SEEN (ref 0–5)
pH: 6 (ref 5.0–8.0)

## 2019-05-31 LAB — ETHANOL: Alcohol, Ethyl (B): <10 mg/dL (ref <10)

## 2019-05-31 LAB — COMPREHENSIVE METABOLIC PANEL
ALT: 8 U/L (ref 0–44)
AST: 21 U/L (ref 15–41)
Albumin: 3.9 g/dL (ref 3.5–5.0)
Alkaline Phosphatase: 57 U/L (ref 38–126)
Anion gap: 10 (ref 5–15)
BUN: 27 mg/dL — ABNORMAL HIGH (ref 8–23)
CO2: 24 mmol/L (ref 22–32)
Calcium: 9.1 mg/dL (ref 8.9–10.3)
Chloride: 106 mmol/L (ref 98–111)
Creatinine, Ser: 1.25 mg/dL — ABNORMAL HIGH (ref 0.44–1.00)
GFR calc Af Amer: 43 mL/min — ABNORMAL LOW (ref >60)
GFR calc non Af Amer: 37 mL/min — ABNORMAL LOW (ref >60)
Glucose, Bld: 176 mg/dL — ABNORMAL HIGH (ref 70–99)
Potassium: 3.8 mmol/L (ref 3.5–5.1)
Sodium: 140 mmol/L (ref 135–145)
Total Bilirubin: 0.6 mg/dL (ref 0.3–1.2)
Total Protein: 7.2 g/dL (ref 6.5–8.1)

## 2019-05-31 LAB — URINE DRUG SCREEN, QUALITATIVE (ARMC ONLY)
Amphetamines, Ur Screen: NOT DETECTED
Barbiturates, Ur Screen: NOT DETECTED
Benzodiazepine, Ur Scrn: NOT DETECTED
Cannabinoid 50 Ng, Ur ~~LOC~~: NOT DETECTED
Cocaine Metabolite,Ur ~~LOC~~: NOT DETECTED
MDMA (Ecstasy)Ur Screen: NOT DETECTED
Methadone Scn, Ur: NOT DETECTED
Opiate, Ur Screen: NOT DETECTED
Phencyclidine (PCP) Ur S: NOT DETECTED
Tricyclic, Ur Screen: NOT DETECTED

## 2019-05-31 LAB — CBC WITH DIFFERENTIAL/PLATELET
Abs Immature Granulocytes: 0.07 10*3/uL (ref 0.00–0.07)
Basophils Absolute: 0 10*3/uL (ref 0.0–0.1)
Basophils Relative: 0 %
Eosinophils Absolute: 0 10*3/uL (ref 0.0–0.5)
Eosinophils Relative: 0 %
HCT: 35.8 % — ABNORMAL LOW (ref 36.0–46.0)
Hemoglobin: 11.5 g/dL — ABNORMAL LOW (ref 12.0–15.0)
Immature Granulocytes: 0 %
Lymphocytes Relative: 24 %
Lymphs Abs: 3.9 10*3/uL (ref 0.7–4.0)
MCH: 32.1 pg (ref 26.0–34.0)
MCHC: 32.1 g/dL (ref 30.0–36.0)
MCV: 100 fL (ref 80.0–100.0)
Monocytes Absolute: 0.6 10*3/uL (ref 0.1–1.0)
Monocytes Relative: 3 %
Neutro Abs: 11.8 10*3/uL — ABNORMAL HIGH (ref 1.7–7.7)
Neutrophils Relative %: 73 %
Platelets: 222 10*3/uL (ref 150–400)
RBC: 3.58 MIL/uL — ABNORMAL LOW (ref 3.87–5.11)
RDW: 14.6 % (ref 11.5–15.5)
WBC: 16.4 10*3/uL — ABNORMAL HIGH (ref 4.0–10.5)
nRBC: 0 % (ref 0.0–0.2)

## 2019-05-31 LAB — TROPONIN I (HIGH SENSITIVITY): Troponin I (High Sensitivity): 25 ng/L — ABNORMAL HIGH (ref <18)

## 2019-05-31 LAB — PROTIME-INR
INR: 1.2 (ref 0.8–1.2)
Prothrombin Time: 15.3 seconds — ABNORMAL HIGH (ref 11.4–15.2)

## 2019-05-31 LAB — SARS CORONAVIRUS 2 (TAT 6-24 HRS): SARS Coronavirus 2: NEGATIVE

## 2019-05-31 LAB — APTT: aPTT: 29 seconds (ref 24–36)

## 2019-05-31 LAB — PROCALCITONIN: Procalcitonin: 0.21 ng/mL

## 2019-05-31 LAB — LACTIC ACID, PLASMA: Lactic Acid, Venous: 2.7 mmol/L (ref 0.5–1.9)

## 2019-05-31 MED ORDER — ASPIRIN 300 MG RE SUPP
300.0000 mg | Freq: Every day | RECTAL | Status: DC
  Administered 2019-05-31: 18:00:00 300 mg via RECTAL
  Filled 2019-05-31 (×2): qty 1

## 2019-05-31 MED ORDER — ACETAMINOPHEN 160 MG/5ML PO SOLN
650.0000 mg | ORAL | Status: DC | PRN
  Filled 2019-05-31: qty 20.3

## 2019-05-31 MED ORDER — ACETAMINOPHEN 325 MG PO TABS
650.0000 mg | ORAL_TABLET | ORAL | Status: DC | PRN
  Administered 2019-06-02: 11:00:00 650 mg via ORAL
  Filled 2019-05-31: qty 2

## 2019-05-31 MED ORDER — LOSARTAN POTASSIUM 50 MG PO TABS
50.0000 mg | ORAL_TABLET | Freq: Every day | ORAL | Status: DC
  Administered 2019-05-31 – 2019-06-02 (×3): 50 mg via ORAL
  Filled 2019-05-31 (×3): qty 1

## 2019-05-31 MED ORDER — SODIUM CHLORIDE 0.9 % IV SOLN: INTRAVENOUS | Status: DC

## 2019-05-31 MED ORDER — STROKE: EARLY STAGES OF RECOVERY BOOK: Freq: Once | Status: AC

## 2019-05-31 MED ORDER — HYDROCORTISONE 2.5 % RE CREA
1.0000 "application " | TOPICAL_CREAM | Freq: Two times a day (BID) | RECTAL | Status: DC | PRN
  Filled 2019-05-31: qty 28.35

## 2019-05-31 MED ORDER — VITAMINS A & D EX OINT
1.0000 "application " | TOPICAL_OINTMENT | Freq: Two times a day (BID) | CUTANEOUS | Status: DC | PRN
  Filled 2019-05-31: qty 113

## 2019-05-31 MED ORDER — AMLODIPINE BESYLATE 5 MG PO TABS
5.0000 mg | ORAL_TABLET | Freq: Every day | ORAL | Status: DC
  Administered 2019-05-31 – 2019-06-01 (×2): 5 mg via ORAL
  Filled 2019-05-31 (×2): qty 1

## 2019-05-31 MED ORDER — ACETAMINOPHEN 650 MG RE SUPP: 650.0000 mg | RECTAL | Status: DC | PRN

## 2019-05-31 MED ORDER — ZINC OXIDE 11.3 % EX CREA
1.0000 "application " | TOPICAL_CREAM | Freq: Four times a day (QID) | CUTANEOUS | Status: DC | PRN
  Filled 2019-05-31: qty 56

## 2019-05-31 MED ORDER — METOPROLOL SUCCINATE ER 25 MG PO TB24
25.0000 mg | ORAL_TABLET | Freq: Every day | ORAL | Status: DC
  Administered 2019-05-31 – 2019-06-02 (×3): 25 mg via ORAL
  Filled 2019-05-31 (×3): qty 1

## 2019-05-31 MED ORDER — FLUTICASONE PROPIONATE 50 MCG/ACT NA SUSP
2.0000 | Freq: Every day | NASAL | Status: DC
  Administered 2019-05-31 – 2019-06-02 (×3): 2 via NASAL
  Filled 2019-05-31: qty 16

## 2019-05-31 MED ORDER — ATROPINE SULFATE 1 % OP SOLN
2.0000 [drp] | OPHTHALMIC | Status: DC | PRN
  Administered 2019-06-02: 2 [drp] via SUBLINGUAL
  Filled 2019-05-31: qty 2

## 2019-05-31 MED ORDER — ATORVASTATIN CALCIUM 20 MG PO TABS
40.0000 mg | ORAL_TABLET | Freq: Every day | ORAL | Status: DC
  Administered 2019-05-31 – 2019-06-01 (×2): 40 mg via ORAL
  Filled 2019-05-31 (×2): qty 2

## 2019-05-31 NOTE — Consult Note (Signed)
Reason for Consult:Syncope Requesting Physician: Agbata  CC: Syncope  I have been asked by Dr. Francine Graven to see this patient in consultation for stroke.  HPI: Katelyn [name] is an 86 y.o. female with medical history significant for hypertension, chronic kidney disease stage 3 , atrial fibrillation on chronic anticoagulation therapy, peripheral arterial disease and history of CVA who was brought into the hospital by EMS for evaluation of mental status changes.  History obtained from daughter.  Per her report patient awakened at 0300 to use the bathroom.  While on the bedside commode lost consciousness.  Was not placed on the floor.  Had some shaking.  EMS was called.  Patient has returned to baseline.   Patient has had similar episodes in the past.  All were related to her being on the toilet.    Past Medical History:  Diagnosis Date  . Abdominal pain   . Anxiety   . Atherosclerosis   . Chronic kidney disease    stage 3  . Colon polyps   . Constipation   . Cough with expectoration   . Dependent edema bilateral legs  . Gait abnormality   . Gout   . Hearing loss   . Hearing loss   . Hemorrhoids   . Hypertension   . Incontinence   . Osteoarthritis   . Osteoarthritis   . Osteoarthritis   . Paget's disease of bone   . Peripheral vascular disease ([place])   . Pneumonia   . Proteinuria   . Stroke [facility])     Past Surgical History:  Procedure Laterality Date  . PARTIAL HYSTERECTOMY    . PERIPHERAL VASCULAR CATHETERIZATION N/A 01/13/2015   Procedure: Abdominal Aortogram w/Lower Extremity;  Surgeon: Algernon Huxley, MD;  Location: [facility];  Service: Cardiovascular;  Laterality: N/A;  . PERIPHERAL VASCULAR CATHETERIZATION  01/13/2015   Procedure: Lower Extremity Intervention;  Surgeon: Algernon Huxley, MD;  Location: [facility];  Service: Cardiovascular;;    Family History  Problem Relation Age of Onset  . Diabetes Mother   . Colon cancer Father   . Cancer Sister    . Cancer Sister   . Cancer Sister     Social History:  reports that she has never smoked. She has never used smokeless tobacco. She reports that she does not drink alcohol or use drugs.  Allergies  Allergen Reactions  . Bee Venom Anaphylaxis    Medications:  I have reviewed the patient's current medications. Prior to Admission:  Medications Prior to Admission  Medication Sig Dispense Refill Last Dose  . alum & mag hydroxide-simeth (MAALOX PLUS) 400-400-40 MG/5ML suspension Take 5 mLs by mouth every 4 (four) hours as needed for indigestion.   05/30/2019 at Unknown time  . amLODipine (NORVASC) 5 MG tablet Take 1 tablet (5 mg total) by mouth daily. 30 tablet 0 05/30/2019 at Unknown time  . apixaban (ELIQUIS) 2.5 MG TABS tablet Take 1 tablet (2.5 mg total) by mouth 2 (two) times daily. 60 tablet 3 05/30/2019 at Unknown time  . atorvastatin (LIPITOR) 40 MG tablet Take 1 tablet (40 mg total) by mouth daily at 6 PM. 30 tablet 2 05/30/2019 at Unknown time  . donepezil (ARICEPT) 10 MG tablet Take 1 tablet (10 mg total) by mouth daily.   05/30/2019 at Unknown time  . fluticasone (FLONASE) 50 MCG/ACT nasal spray Place 2 sprays into both nostrils daily. 16 g 5 05/30/2019 at Unknown time  . LORazepam (ATIVAN) 0.5 MG tablet  Take 0.5 mg by mouth 3 (three) times daily.    05/30/2019 at Unknown time  . losartan (COZAAR) 50 MG tablet Take 50 mg by mouth daily.   05/30/2019 at Unknown time  . methimazole (TAPAZOLE) 5 MG tablet Take 2.5 mg by mouth 2 (two) times daily.    05/30/2019 at Unknown time  . metoprolol succinate (TOPROL-XL) 25 MG 24 hr tablet Take 25 mg by mouth daily.   05/30/2019 at Unknown time  . acetaminophen (TYLENOL) 325 MG tablet Take 2 tablets (650 mg total) by mouth every 6 (six) hours as needed for mild pain (or Fever >/= 101). (Patient not taking: Reported on 04/21/2018) 30 tablet 0 PRN at PRN  . atropine 1 % ophthalmic solution Place 2 drops under the tongue every hour as needed (excessive  secretions).   PRN at PRN  . guaiFENesin-dextromethorphan (ROBITUSSIN DM) 100-10 MG/5ML syrup Take 5 mLs by mouth every 6 (six) hours as needed for cough. 118 mL 0 PRN at PRN  . hydrocortisone (ANUSOL-HC) 2.5 % rectal cream Place 1 application rectally 2 (two) times daily as needed for hemorrhoids.   PRN at PRN  . ondansetron (ZOFRAN-ODT) 4 MG disintegrating tablet Take 4 mg by mouth every 4 (four) hours as needed for nausea or vomiting.   PRN at PRN  . polyethylene glycol (MIRALAX / GLYCOLAX) packet Take 17 g by mouth daily.   PRN at PRN  . sennosides-docusate sodium (SENOKOT-S) 8.6-50 MG tablet Take 1 tablet by mouth 2 (two) times daily as needed for constipation.   PRN at PRN  . Vitamins A & D (VITAMIN A & D) ointment Apply 1 application topically 2 (two) times daily as needed for dry skin (itchy skin).   PRN at PRN  . Zinc Oxide 13 % CREA Place 1 application rectally 4 (four) times daily as needed (painful hemorrhoids).   PRN at PRN   Scheduled: .  stroke: mapping our early stages of recovery book   Does not apply Once  . aspirin  300 mg Rectal Daily  . atorvastatin  40 mg Oral q1800  . fluticasone  2 spray Each Nare Daily    ROS: History obtained from the patient  General ROS: negative for - chills, fatigue, fever, night sweats, weight gain or weight loss Psychological ROS: negative for - behavioral disorder, hallucinations, memory difficulties, mood swings or suicidal ideation Ophthalmic ROS: negative for - blurry vision, double vision, eye pain or loss of vision ENT ROS: negative for - epistaxis, nasal discharge, oral lesions, sore throat, tinnitus or vertigo Allergy and Immunology ROS: negative for - hives or itchy/watery eyes Hematological and Lymphatic ROS: negative for - bleeding problems, bruising or swollen lymph nodes Endocrine ROS: negative for - galactorrhea, hair pattern changes, polydipsia/polyuria or temperature intolerance Respiratory ROS: negative for - cough,  hemoptysis, shortness of breath or wheezing Cardiovascular ROS: negative for - chest pain, dyspnea on exertion, edema or irregular heartbeat Gastrointestinal ROS: negative for - abdominal pain, diarrhea, hematemesis, nausea/vomiting or stool incontinence Genito-Urinary ROS: negative for - dysuria, hematuria, incontinence or urinary frequency/urgency Musculoskeletal ROS: negative for - joint swelling or muscular weakness Neurological ROS: as noted in HPI Dermatological ROS: negative for rash and skin lesion changes  Physical Examination: Blood pressure (!) 172/70, pulse 74, temperature 98.6 F (37 C), resp. rate 18, height 5\' 3"  (1.6 m), weight 64.1 kg, SpO2 91 %.  HEENT-  Normocephalic, no lesions, without obvious abnormality.  Normal external eye and conjunctiva.  Normal TM's bilaterally.  Normal auditory canals and external ears. Normal external nose, mucus membranes and septum.  Normal pharynx. Cardiovascular- S1, S2 normal, pulses palpable throughout   Lungs- chest clear, no wheezing, rales, normal symmetric air entry Abdomen- soft, non-tender; bowel sounds normal; no masses,  no organomegaly Extremities- no edema Lymph-no adenopathy palpable Musculoskeletal-no joint tenderness, deformity or swelling Skin-warm and dry, no hyperpigmentation, vitiligo, or suspicious lesions  Neurological Examination   Mental Status: Alert, oriented to name.  Reports it is 1978.  Speech fluent without evidence of aphasia.  Able to follow simple commands.  Needs reinforcement for 3-step commands. Cranial Nerves: II: Visual fields grossly normal, pupils equal, round, reactive to light and accommodation III,IV, VI: ptosis not present, extra-ocular motions intact bilaterally V,VII: smile symmetric, facial light touch sensation normal bilaterally VIII: hearing decreased IX,X: gag reflex present XI: bilateral shoulder shrug XII: midline tongue extension Motor: Right : Upper extremity   5/5    Left:      Upper extremity   5/5  Lower extremity   5/5     Lower extremity   5/5 Tone and bulk:normal tone throughout; no atrophy noted Sensory: Pinprick and light touch intact throughout, bilaterally Deep Tendon Reflexes: Symmetric throughout Plantars: Right: mute   Left: mute Cerebellar: Normal finger-to-nose and normal heel-to-shin testing bilaterally Gait: not tested due to safety concerns    Laboratory Studies:   Basic Metabolic Panel: Recent Labs  Lab 05/31/19 0452  NA 140  K 3.8  CL 106  CO2 24  GLUCOSE 176*  BUN 27*  CREATININE 1.25*  CALCIUM 9.1    Liver Function Tests: Recent Labs  Lab 05/31/19 0452  AST 21  ALT 8  ALKPHOS 57  BILITOT 0.6  PROT 7.2  ALBUMIN 3.9   No results for input(s): LIPASE, AMYLASE in the last 168 hours. No results for input(s): AMMONIA in the last 168 hours.  CBC: Recent Labs  Lab 05/31/19 0452  WBC 16.4*  NEUTROABS 11.8*  HGB 11.5*  HCT 35.8*  MCV 100.0  PLT 222    Cardiac Enzymes: No results for input(s): CKTOTAL, CKMB, CKMBINDEX, TROPONINI in the last 168 hours.  BNP: Invalid input(s): POCBNP  CBG: No results for input(s): GLUCAP in the last 168 hours.  Microbiology: Results for orders placed or performed during the hospital encounter of 04/21/18  Blood Culture (routine x 2)     Status: None   Collection Time: 04/21/18  5:06 PM   Specimen: BLOOD  Result Value Ref Range Status   Specimen Description BLOOD RIGHT ANTECUBITAL  Final   Special Requests   Final    BOTTLES DRAWN AEROBIC AND ANAEROBIC Blood Culture results may not be optimal due to an excessive volume of blood received in culture bottles   Culture   Final    NO GROWTH 5 DAYS Performed at [facility], [address] [place], [place] [address]    Report Status 04/26/2018 FINAL  Final  Blood Culture (routine x 2)     Status: None   Collection Time: 04/21/18  5:11 PM   Specimen: BLOOD  Result Value Ref Range Status   Specimen Description BLOOD  LEFT ANTECUBITAL  Final   Special Requests   Final    BOTTLES DRAWN AEROBIC AND ANAEROBIC Blood Culture results may not be optimal due to an excessive volume of blood received in culture bottles   Culture   Final    NO GROWTH 5 DAYS Performed at [facility], [address] [place],  [place] [address]    Report Status 04/26/2018 FINAL  Final    Coagulation Studies: Recent Labs    05/31/19 0452  LABPROT 15.3*  INR 1.2    Urinalysis:  Recent Labs  Lab 05/31/19 0655  COLORURINE YELLOW*  LABSPEC 1.017  PHURINE 6.0  GLUCOSEU NEGATIVE  HGBUR LARGE*  BILIRUBINUR NEGATIVE  KETONESUR NEGATIVE  PROTEINUR 30*  NITRITE NEGATIVE  LEUKOCYTESUR NEGATIVE    Lipid Panel:     Component Value Date/Time   CHOL 160 12/24/2014 0445   TRIG 82 12/24/2014 0445   HDL 40 (L) 12/24/2014 0445   CHOLHDL 4.0 12/24/2014 0445   VLDL 16 12/24/2014 0445   LDLCALC 104 (H) 12/24/2014 0445    HgbA1C:  Lab Results  Component Value Date   HGBA1C 5.7 (H) 01/27/2017    Urine Drug Screen:      Component Value Date/Time   LABOPIA NONE DETECTED 05/31/2019 0655   COCAINSCRNUR NONE DETECTED 05/31/2019 0655   LABBENZ NONE DETECTED 05/31/2019 0655   AMPHETMU NONE DETECTED 05/31/2019 0655   THCU NONE DETECTED 05/31/2019 0655   LABBARB NONE DETECTED 05/31/2019 0655    Alcohol Level:  Recent Labs  Lab 05/31/19 0452  ETH <10    Other results: EKG: atrial fibrillation, rate 94 bpm.  Imaging: CT HEAD WO CONTRAST  Result Date: 05/31/2019 CLINICAL DATA:  Neuro deficit. Acute stroke suspected. Acute mental status change and weakness. EXAM: CT HEAD WITHOUT CONTRAST TECHNIQUE: Contiguous axial images were obtained from the base of the skull through the vertex without intravenous contrast. COMPARISON:  December 20, 2017 FINDINGS: Brain: No subdural, epidural, or subarachnoid hemorrhage. Ventricles and sulci are stable. White matter changes are centrally stable. A lacunar infarct in the right  basal ganglia is unchanged. An infarct in the left parietal temporal region is unchanged. No acute cortical ischemia or infarct identified. Cerebellum, brainstem, and basal cisterns are normal. No mass effect or midline shift. Vascular: Calcified atherosclerosis is seen in the intracranial carotids. Skull: Normal. Negative for fracture or focal lesion. Sinuses/Orbits: Chronic opacification of the right frontal sinus with surrounding bony sclerosis is stable. No other sinus disease identified. Other: None. IMPRESSION: 1. Chronic white matter changes and left parietal temporal infarct. No acute ischemia or infarct identified. 2. Chronic opacification the right frontal sinus with surrounding bony sclerosis, unchanged. Electronically Signed   By: Dorise Bullion III M.D   On: 05/31/2019 05:32   MR BRAIN WO CONTRAST  Result Date: 05/31/2019 CLINICAL DATA:  Ataxia, stroke suspected. EXAM: MRI HEAD WITHOUT CONTRAST TECHNIQUE: Multiplanar, multiecho pulse sequences of the brain and surrounding structures were obtained without intravenous contrast. COMPARISON:  Head CT performed earlier the same day 05/31/2019, brain MRI 01/28/2017 FINDINGS: Brain: The examination is intermittently motion degraded. Most notably, there is moderate motion degradation of the axial T1 weighted sequence. There is no evidence of acute infarct. No evidence of intracranial mass. No midline shift or extra-axial fluid collection. No chronic intracranial blood products. Redemonstrated small cortically based left parietooccipital infarct. Moderate patchy and confluent T2/FLAIR hyperintensity within the cerebral white matter is nonspecific, but consistent with chronic small vessel ischemic disease. Redemonstrated chronic right basal ganglia lacunar infarcts. Stable, mild generalized parenchymal atrophy. Vascular: Flow voids maintained within the proximal large arterial vessels. Skull and upper cervical spine: No focal marrow lesion. Sinuses/Orbits:  Visualized orbits demonstrate no acute abnormality. Paranasal sinus disease. Most notably there is near complete T2 hyperintense opacification of the right frontal sinus. Trace fluid within left mastoid air cells. IMPRESSION:  1. Intermittently motion degraded examination. 2. No evidence of acute intracranial abnormality, including acute infarction. 3. Remote, small chronic cortically based left parietooccipital infarct. 4. Moderate chronic small vessel ischemic disease. Chronic right basal ganglia lacunar infarcts. 5. Mild generalized parenchymal atrophy. 6. Paranasal sinus disease, most notably of the right frontal sinus. 7. Trace left mastoid effusion. Electronically Signed   By: Kellie Simmering DO   On: 05/31/2019 10:50   US Carotid Bilateral (at [facility] and [facility] only)  Result Date: 05/31/2019 CLINICAL DATA:  Altered mental status EXAM: BILATERAL CAROTID DUPLEX ULTRASOUND TECHNIQUE: Pearline Cables scale imaging, color Doppler and duplex ultrasound were performed of bilateral carotid and vertebral arteries in the neck. COMPARISON:  Prior duplex carotid ultrasound 12/23/2014; CT angiogram of the neck FINDINGS: Criteria: Quantification of carotid stenosis is based on velocity parameters that correlate the residual internal carotid diameter with NASCET-based stenosis levels, using the diameter of the distal internal carotid lumen as the denominator for stenosis measurement. The following velocity measurements were obtained: RIGHT ICA: 61/23 cm/sec CCA: 31/51 cm/sec SYSTOLIC ICA/CCA RATIO:  1.1 ECA:  57 cm/sec LEFT ICA: 316/69 cm/sec CCA: 76/16 cm/sec SYSTOLIC ICA/CCA RATIO:  6.4 ECA:  190 cm/sec RIGHT CAROTID ARTERY: Minimal heterogeneous atherosclerotic plaque in the proximal internal carotid artery. By peak systolic velocity criteria, the estimated stenosis remains less than 50%. RIGHT VERTEBRAL ARTERY:  Patent with normal antegrade flow. LEFT CAROTID ARTERY: Bulky heterogeneous atherosclerotic plaque in the proximal internal  carotid artery. There is significant elevation of the peak systolic velocity consistent with a greater than 70% diameter stenosis. There is associated spectral broadening, aliasing and high velocity jetting. LEFT VERTEBRAL ARTERY:  Patent with normal antegrade flow. IMPRESSION: 1. Severe (70-99%) stenosis proximal left internal carotid artery secondary to bulky heterogeneous and partially calcified atherosclerotic plaque. 2. Mild (1-49%) stenosis proximal right internal carotid artery secondary to heterogenous atherosclerotic plaque. 3. Vertebral arteries are patent with normal antegrade flow. Signed, Criselda Peaches, MD, [facility] Electronically Signed   By: Jacqulynn Cadet M.D.   On: 05/31/2019 14:43   DG Chest Port 1 View  Result Date: 05/31/2019 CLINICAL DATA:  Aspiration. Patient was not breathing. CPR done by family. EXAM: PORTABLE CHEST 1 VIEW COMPARISON:  04/21/2018 and CT of the neck on 01/27/2017 FINDINGS: The heart is enlarged. Superior mediastinal with significant displacement of the trachea to the RIGHT, consistent with multinodular goiter as seen on previous CT exam. There is atherosclerotic calcification of the thoracic aorta. Lungs are clear. No pulmonary edema. Degenerative changes in the shoulders. IMPRESSION: Cardiomegaly.  No edema. Superior mediastinal mass consistent with goiter. Significant deviation of the trachea to the RIGHT. Electronically Signed   By: Nolon Nations M.D.   On: 05/31/2019 09:45     Assessment/Plan: 86 y.o. female with medical history significant for hypertension, chronic kidney disease stage 3, atrial fibrillation on Eliquis, peripheral arterial disease and history of CVA who was brought into the hospital by EMS for evaluation of mental status changes.  Patient with a syncopal episode while on the toilet.  Now back to baseline.  Has had multiple of these episodes in the past.  Suspect  vasovagal etiology.  MRI of the brain reviewed and shows no acute changes.    Carotid dopplers show 70-99% stenosis of the LICA and 0-73% stenosis of the RICA.  At current age patient not at a CEA candidate.    Recommendations: 1. Orthostatic vitals 2. EEG 3. Continue Eliquis.   4.  If above work up unremarkable, no further neurologic intervention is recommended at this time.  If further questions arise, please call or page at that time.  Thank you for allowing neurology to participate in the care of this patient.  Alexis Goodell, MD Neurology [phone] 05/31/2019, 3:53 PM

## 2019-05-31 NOTE — Consult Note (Signed)
Katelyn [name] is a 86 y.o. female  [id number]  Primary Cardiologist: Neoma Laming Reason for Consultation: Atrial fibrillation  HPI: 86 year old African-American female female with history of atrial fibrillation on chronic anticoagulation therapy with Eliquis presented to the hospital with confusion and possible CVA and I was asked to evaluate the patient.  Patient is unable to give some history but apparently was very confused this morning and was brought by the family to the hospital.  CT scan of the head showed temporal infarct which is old and no acute bleeding and questionable for the patient should be continued on Eliquis and MRI is pending.   Review of Systems: Unable to get much history   Past Medical History:  Diagnosis Date  . Abdominal pain   . Anxiety   . Atherosclerosis   . Chronic kidney disease    stage 3  . Colon polyps   . Constipation   . Cough with expectoration   . Dependent edema bilateral legs  . Gait abnormality   . Gout   . Hearing loss   . Hearing loss   . Hemorrhoids   . Hypertension   . Incontinence   . Osteoarthritis   . Osteoarthritis   . Osteoarthritis   . Paget's disease of bone   . Peripheral vascular disease ([place])   . Pneumonia   . Proteinuria   . Stroke [facility])     Medications Prior to Admission  Medication Sig Dispense Refill  . alum & mag hydroxide-simeth (MAALOX PLUS) 400-400-40 MG/5ML suspension Take 5 mLs by mouth every 4 (four) hours as needed for indigestion.    Marland Kitchen amLODipine (NORVASC) 5 MG tablet Take 1 tablet (5 mg total) by mouth daily. 30 tablet 0  . apixaban (ELIQUIS) 2.5 MG TABS tablet Take 1 tablet (2.5 mg total) by mouth 2 (two) times daily. 60 tablet 3  . atorvastatin (LIPITOR) 40 MG tablet Take 1 tablet (40 mg total) by mouth daily at 6 PM. 30 tablet 2  . donepezil (ARICEPT) 10 MG tablet Take 1 tablet (10 mg total) by mouth daily.    . fluticasone (FLONASE) 50 MCG/ACT nasal spray Place 2 sprays into both  nostrils daily. 16 g 5  . LORazepam (ATIVAN) 0.5 MG tablet Take 0.5 mg by mouth 3 (three) times daily.     Marland Kitchen losartan (COZAAR) 50 MG tablet Take 50 mg by mouth daily.    . methimazole (TAPAZOLE) 5 MG tablet Take 2.5 mg by mouth 2 (two) times daily.     . metoprolol succinate (TOPROL-XL) 25 MG 24 hr tablet Take 25 mg by mouth daily.    Marland Kitchen acetaminophen (TYLENOL) 325 MG tablet Take 2 tablets (650 mg total) by mouth every 6 (six) hours as needed for mild pain (or Fever >/= 101). (Patient not taking: Reported on 04/21/2018) 30 tablet 0  . atropine 1 % ophthalmic solution Place 2 drops under the tongue every hour as needed (excessive secretions).    Marland Kitchen guaiFENesin-dextromethorphan (ROBITUSSIN DM) 100-10 MG/5ML syrup Take 5 mLs by mouth every 6 (six) hours as needed for cough. 118 mL 0  . hydrocortisone (ANUSOL-HC) 2.5 % rectal cream Place 1 application rectally 2 (two) times daily as needed for hemorrhoids.    . ondansetron (ZOFRAN-ODT) 4 MG disintegrating tablet Take 4 mg by mouth every 4 (four) hours as needed for nausea or vomiting.    . polyethylene glycol (MIRALAX / GLYCOLAX) packet Take 17 g by mouth daily.    . sennosides-docusate  sodium (SENOKOT-S) 8.6-50 MG tablet Take 1 tablet by mouth 2 (two) times daily as needed for constipation.    . Vitamins A & D (VITAMIN A & D) ointment Apply 1 application topically 2 (two) times daily as needed for dry skin (itchy skin).    . Zinc Oxide 13 % CREA Place 1 application rectally 4 (four) times daily as needed (painful hemorrhoids).       .  stroke: mapping our early stages of recovery book   Does not apply Once  . aspirin  300 mg Rectal Daily  . atorvastatin  40 mg Oral q1800  . fluticasone  2 spray Each Nare Daily    Infusions: . sodium chloride      Allergies  Allergen Reactions  . Bee Venom Anaphylaxis    Social History   Socioeconomic History  . Marital status: Widowed    Spouse name: Not on file  . Number of children: Not on file  .  Years of education: Not on file  . Highest education level: Not on file  Occupational History  . Not on file  Tobacco Use  . Smoking status: Never Smoker  . Smokeless tobacco: Never Used  Substance and Sexual Activity  . Alcohol use: No    Alcohol/week: 0.0 standard drinks  . Drug use: No  . Sexual activity: Not on file  Other Topics Concern  . Not on file  Social History Narrative   Lives at home with family.  Wheelchair-bound at baseline   Social Determinants of Radio broadcast assistant Strain:   . Difficulty of Paying Living Expenses:   Food Insecurity:   . Worried About Charity fundraiser in the Last Year:   . Arboriculturist in the Last Year:   Transportation Needs:   . Film/video editor (Medical):   Marland Kitchen Lack of Transportation (Non-Medical):   Physical Activity:   . Days of Exercise per Week:   . Minutes of Exercise per Session:   Stress:   . Feeling of Stress :   Social Connections:   . Frequency of Communication with Friends and Family:   . Frequency of Social Gatherings with Friends and Family:   . Attends Religious Services:   . Active Member of Clubs or Organizations:   . Attends Archivist Meetings:   Marland Kitchen Marital Status:   Intimate Partner Violence:   . Fear of Current or Ex-Partner:   . Emotionally Abused:   Marland Kitchen Physically Abused:   . Sexually Abused:     Family History  Problem Relation Age of Onset  . Diabetes Mother   . Colon cancer Father   . Cancer Sister   . Cancer Sister   . Cancer Sister     PHYSICAL EXAM: Vitals:   05/31/19 0835 05/31/19 1048  BP: 130/73 (!) 172/70  Pulse: 75 74  Resp: 20 18  Temp:  98.6 F (37 C)  SpO2: 98% 91%    No intake or output data in the 24 hours ending 05/31/19 1128  General:  Well appearing. No respiratory difficulty HEENT: normal Neck: supple. no JVD. Carotids 2+ bilat; no bruits. No lymphadenopathy or thryomegaly appreciated. Cor: PMI nondisplaced. Regular rate & rhythm. No rubs,  gallops or murmurs. Lungs: clear Abdomen: soft, nontender, nondistended. No hepatosplenomegaly. No bruits or masses. Good bowel sounds. Extremities: no cyanosis, clubbing, rash, edema Neuro: alert & oriented x 3, cranial nerves grossly intact. moves all 4 extremities w/o difficulty. Affect pleasant.  ECG: Atrial fibrillation with rapid ventricular response rate  Results for orders placed or performed during the hospital encounter of 05/31/19 (from the past 24 hour(s))  APTT     Status: None   Collection Time: 05/31/19  4:52 AM  Result Value Ref Range   aPTT 29 24 - 36 seconds  Comprehensive metabolic panel     Status: Abnormal   Collection Time: 05/31/19  4:52 AM  Result Value Ref Range   Sodium 140 135 - 145 mmol/L   Potassium 3.8 3.5 - 5.1 mmol/L   Chloride 106 98 - 111 mmol/L   CO2 24 22 - 32 mmol/L   Glucose, Bld 176 (H) 70 - 99 mg/dL   BUN 27 (H) 8 - 23 mg/dL   Creatinine, Ser 1.25 (H) 0.44 - 1.00 mg/dL   Calcium 9.1 8.9 - 10.3 mg/dL   Total Protein 7.2 6.5 - 8.1 g/dL   Albumin 3.9 3.5 - 5.0 g/dL   AST 21 15 - 41 U/L   ALT 8 0 - 44 U/L   Alkaline Phosphatase 57 38 - 126 U/L   Total Bilirubin 0.6 0.3 - 1.2 mg/dL   GFR calc non Af Amer 37 (L) >60 mL/min   GFR calc Af Amer 43 (L) >60 mL/min   Anion gap 10 5 - 15  CBC WITH DIFFERENTIAL     Status: Abnormal   Collection Time: 05/31/19  4:52 AM  Result Value Ref Range   WBC 16.4 (H) 4.0 - 10.5 K/uL   RBC 3.58 (L) 3.87 - 5.11 MIL/uL   Hemoglobin 11.5 (L) 12.0 - 15.0 g/dL   HCT 35.8 (L) 36.0 - 46.0 %   MCV 100.0 80.0 - 100.0 fL   MCH 32.1 26.0 - 34.0 pg   MCHC 32.1 30.0 - 36.0 g/dL   RDW 14.6 11.5 - 15.5 %   Platelets 222 150 - 400 K/uL   nRBC 0.0 0.0 - 0.2 %   Neutrophils Relative % 73 %   Neutro Abs 11.8 (H) 1.7 - 7.7 K/uL   Lymphocytes Relative 24 %   Lymphs Abs 3.9 0.7 - 4.0 K/uL   Monocytes Relative 3 %   Monocytes Absolute 0.6 0.1 - 1.0 K/uL   Eosinophils Relative 0 %   Eosinophils Absolute 0.0 0.0 - 0.5 K/uL    Basophils Relative 0 %   Basophils Absolute 0.0 0.0 - 0.1 K/uL   Immature Granulocytes 0 %   Abs Immature Granulocytes 0.07 0.00 - 0.07 K/uL  Ethanol     Status: None   Collection Time: 05/31/19  4:52 AM  Result Value Ref Range   Alcohol, Ethyl (B) <10 <10 mg/dL  Protime-INR     Status: Abnormal   Collection Time: 05/31/19  4:52 AM  Result Value Ref Range   Prothrombin Time 15.3 (H) 11.4 - 15.2 seconds   INR 1.2 0.8 - 1.2  Procalcitonin     Status: None   Collection Time: 05/31/19  4:52 AM  Result Value Ref Range   Procalcitonin 0.21 ng/mL  Troponin I (High Sensitivity)     Status: Abnormal   Collection Time: 05/31/19  4:52 AM  Result Value Ref Range   Troponin I (High Sensitivity) 25 (H) <18 ng/L  Urine Drug Screen, Qualitative     Status: None   Collection Time: 05/31/19  6:55 AM  Result Value Ref Range   Tricyclic, Ur Screen NONE DETECTED NONE DETECTED   Amphetamines, Ur Screen NONE DETECTED NONE DETECTED   MDMA (  Ecstasy)Ur Screen NONE DETECTED NONE DETECTED   Cocaine Metabolite,Ur [place] NONE DETECTED NONE DETECTED   Opiate, Ur Screen NONE DETECTED NONE DETECTED   Phencyclidine (PCP) Ur S NONE DETECTED NONE DETECTED   Cannabinoid 50 Ng, Ur [place] NONE DETECTED NONE DETECTED   Barbiturates, Ur Screen NONE DETECTED NONE DETECTED   Benzodiazepine, Ur Scrn NONE DETECTED NONE DETECTED   Methadone Scn, Ur NONE DETECTED NONE DETECTED  Urinalysis, Routine w reflex microscopic     Status: Abnormal   Collection Time: 05/31/19  6:55 AM  Result Value Ref Range   Color, Urine YELLOW (A) YELLOW   APPearance CLEAR (A) CLEAR   Specific Gravity, Urine 1.017 1.005 - 1.030   pH 6.0 5.0 - 8.0   Glucose, UA NEGATIVE NEGATIVE mg/dL   Hgb urine dipstick LARGE (A) NEGATIVE   Bilirubin Urine NEGATIVE NEGATIVE   Ketones, ur NEGATIVE NEGATIVE mg/dL   Protein, ur 30 (A) NEGATIVE mg/dL   Nitrite NEGATIVE NEGATIVE   Leukocytes,Ua NEGATIVE NEGATIVE   RBC / HPF >50 (H) 0 - 5 RBC/hpf   WBC, UA 6-10 0  - 5 WBC/hpf   Bacteria, UA RARE (A) NONE SEEN   Squamous Epithelial / LPF NONE SEEN 0 - 5   Mucus PRESENT    Hyaline Casts, UA PRESENT   Lactic acid, plasma     Status: Abnormal   Collection Time: 05/31/19  7:47 AM  Result Value Ref Range   Lactic Acid, Venous 2.7 (HH) 0.5 - 1.9 mmol/L   CT HEAD WO CONTRAST  Result Date: 05/31/2019 CLINICAL DATA:  Neuro deficit. Acute stroke suspected. Acute mental status change and weakness. EXAM: CT HEAD WITHOUT CONTRAST TECHNIQUE: Contiguous axial images were obtained from the base of the skull through the vertex without intravenous contrast. COMPARISON:  December 20, 2017 FINDINGS: Brain: No subdural, epidural, or subarachnoid hemorrhage. Ventricles and sulci are stable. White matter changes are centrally stable. A lacunar infarct in the right basal ganglia is unchanged. An infarct in the left parietal temporal region is unchanged. No acute cortical ischemia or infarct identified. Cerebellum, brainstem, and basal cisterns are normal. No mass effect or midline shift. Vascular: Calcified atherosclerosis is seen in the intracranial carotids. Skull: Normal. Negative for fracture or focal lesion. Sinuses/Orbits: Chronic opacification of the right frontal sinus with surrounding bony sclerosis is stable. No other sinus disease identified. Other: None. IMPRESSION: 1. Chronic white matter changes and left parietal temporal infarct. No acute ischemia or infarct identified. 2. Chronic opacification the right frontal sinus with surrounding bony sclerosis, unchanged. Electronically Signed   By: Dorise Bullion III M.D   On: 05/31/2019 05:32   MR BRAIN WO CONTRAST  Result Date: 05/31/2019 CLINICAL DATA:  Ataxia, stroke suspected. EXAM: MRI HEAD WITHOUT CONTRAST TECHNIQUE: Multiplanar, multiecho pulse sequences of the brain and surrounding structures were obtained without intravenous contrast. COMPARISON:  Head CT performed earlier the same day 05/31/2019, brain MRI 01/28/2017  FINDINGS: Brain: The examination is intermittently motion degraded. Most notably, there is moderate motion degradation of the axial T1 weighted sequence. There is no evidence of acute infarct. No evidence of intracranial mass. No midline shift or extra-axial fluid collection. No chronic intracranial blood products. Redemonstrated small cortically based left parietooccipital infarct. Moderate patchy and confluent T2/FLAIR hyperintensity within the cerebral white matter is nonspecific, but consistent with chronic small vessel ischemic disease. Redemonstrated chronic right basal ganglia lacunar infarcts. Stable, mild generalized parenchymal atrophy. Vascular: Flow voids maintained within the proximal large arterial vessels. Skull and upper  cervical spine: No focal marrow lesion. Sinuses/Orbits: Visualized orbits demonstrate no acute abnormality. Paranasal sinus disease. Most notably there is near complete T2 hyperintense opacification of the right frontal sinus. Trace fluid within left mastoid air cells. IMPRESSION: 1. Intermittently motion degraded examination. 2. No evidence of acute intracranial abnormality, including acute infarction. 3. Remote, small chronic cortically based left parietooccipital infarct. 4. Moderate chronic small vessel ischemic disease. Chronic right basal ganglia lacunar infarcts. 5. Mild generalized parenchymal atrophy. 6. Paranasal sinus disease, most notably of the right frontal sinus. 7. Trace left mastoid effusion. Electronically Signed   By: Kellie Simmering DO   On: 05/31/2019 10:50   DG Chest Port 1 View  Result Date: 05/31/2019 CLINICAL DATA:  Aspiration. Patient was not breathing. CPR done by family. EXAM: PORTABLE CHEST 1 VIEW COMPARISON:  04/21/2018 and CT of the neck on 01/27/2017 FINDINGS: The heart is enlarged. Superior mediastinal with significant displacement of the trachea to the RIGHT, consistent with multinodular goiter as seen on previous CT exam. There is atherosclerotic  calcification of the thoracic aorta. Lungs are clear. No pulmonary edema. Degenerative changes in the shoulders. IMPRESSION: Cardiomegaly.  No edema. Superior mediastinal mass consistent with goiter. Significant deviation of the trachea to the RIGHT. Electronically Signed   By: Nolon Nations M.D.   On: 05/31/2019 09:45     ASSESSMENT AND PLAN: Atrial fibrillation with rapid ventricular response rate on anticoagulation with Eliquis.  Since there is no evidence of intracranial bleed on CT of the head advise continuing Eliquis.  Also get an echocardiogram to evaluate if there is any thrombi.  MRI is pending.  Thank you very much for referral.  Mikaele Stecher A

## 2019-05-31 NOTE — H&P (Signed)
History and Physical    Katelyn [name] RFF:[id number] DOB: 04/19/1923 DOA: 05/31/2019  PCP: Abelardo Diesel, NP (Inactive)   Patient coming from: Home  I have personally briefly reviewed patient's old medical records in [place]  Chief Complaint: Change in mental status                                Slurred speech  HPI: Katelyn [name] is a 86 y.o. female with medical history significant for hypertension, chronic kidney disease stage 3 , atrial fibrillation on chronic anticoagulation therapy, peripheral arterial disease and history of CVA who was brought into the hospital by EMS for evaluation of mental status changes.  Most of the history was obtained from patient's daughter over the phone.  Per patient's daughter her mother was in her usual state of health and had gone to bed last night, she was unsure of the time.  Her sister who was staying at the house with her mother had gone to wake her mother up in the morning but noted she was not responding.  She called her other sister who lives close by and she called EMS.  EMS notes that the patient had garbled speech and a left-sided facial droop when they arrived so she was brought to the ER for further evaluation. She had a CT  scan of the head without contrast which showed no intracranial hemorrhage but showed chronic white matter changes and left parietal temporal infarct. No acute ischemia or infarct identified. Patient speech is still garbled and she will be admitted to the hospital for further evaluation I am unable to do review of systems due to patient's dysarthria  ED Course: Patient was seen in the emergency room and per family, last known well time was about 4 AM.  Patient was said to have had an episode where she was transiently unresponsive and when she regained consciousness was noted to have dysarthria and left-sided facial droop.  Patient was deemed not to be a candidate for TPA because she is on Eliquis CTA of  the brain was offered but patient's daughter declined and does not want any aggressive intervention  Review of Systems: As per HPI otherwise 10 point review of systems negative.    Past Medical History:  Diagnosis Date  . Abdominal pain   . Anxiety   . Atherosclerosis   . Chronic kidney disease    stage 3  . Colon polyps   . Constipation   . Cough with expectoration   . Dependent edema bilateral legs  . Gait abnormality   . Gout   . Hearing loss   . Hearing loss   . Hemorrhoids   . Hypertension   . Incontinence   . Osteoarthritis   . Osteoarthritis   . Osteoarthritis   . Paget's disease of bone   . Peripheral vascular disease ([place])   . Pneumonia   . Proteinuria   . Stroke [facility])     Past Surgical History:  Procedure Laterality Date  . PARTIAL HYSTERECTOMY    . PERIPHERAL VASCULAR CATHETERIZATION N/A 01/13/2015   Procedure: Abdominal Aortogram w/Lower Extremity;  Surgeon: Algernon Huxley, MD;  Location: [place] [facility];  Service: Cardiovascular;  Laterality: N/A;  . PERIPHERAL VASCULAR CATHETERIZATION  01/13/2015   Procedure: Lower Extremity Intervention;  Surgeon: Algernon Huxley, MD;  Location: [place] [facility];  Service: Cardiovascular;;     reports that  she has never smoked. She has never used smokeless tobacco. She reports that she does not drink alcohol or use drugs.  Allergies  Allergen Reactions  . Bee Venom Anaphylaxis    Family History  Problem Relation Age of Onset  . Diabetes Mother   . Colon cancer Father   . Cancer Sister   . Cancer Sister   . Cancer Sister      Prior to Admission medications   Medication Sig Start Date End Date Taking? Authorizing Provider  acetaminophen (TYLENOL) 325 MG tablet Take 2 tablets (650 mg total) by mouth every 6 (six) hours as needed for mild pain (or Fever >/= 101). Patient not taking: Reported on 04/21/2018 01/29/17   Gladstone Lighter, MD  acetaminophen (TYLENOL) 650 MG CR tablet Take 1,300 mg by mouth 2 (two)  times daily as needed for pain.    [provider]  alum & mag hydroxide-simeth (MAALOX PLUS) 400-400-40 MG/5ML suspension Take 5 mLs by mouth every 4 (four) hours as needed for indigestion.    [provider]  amLODipine (NORVASC) 5 MG tablet Take 1 tablet (5 mg total) by mouth daily. Patient not taking: Reported on 04/21/2018 12/27/14   Nicholes Mango, MD  apixaban (ELIQUIS) 2.5 MG TABS tablet Take 1 tablet (2.5 mg total) by mouth 2 (two) times daily. 01/13/15   Algernon Huxley, MD  atorvastatin (LIPITOR) 40 MG tablet Take 1 tablet (40 mg total) by mouth daily at 6 PM. Patient not taking: Reported on 04/21/2018 01/29/17   Gladstone Lighter, MD  atropine 1 % ophthalmic solution Place 2 drops under the tongue every hour as needed (excessive secretions).    [provider]  donepezil (ARICEPT) 10 MG tablet Take 1 tablet (10 mg total) by mouth daily. 04/23/18   Henreitta Leber, MD  fluticasone (FLONASE) 50 MCG/ACT nasal spray Place 2 sprays into both nostrils daily. 09/03/14   Glean Hess, MD  guaiFENesin-dextromethorphan (ROBITUSSIN DM) 100-10 MG/5ML syrup Take 5 mLs by mouth every 6 (six) hours as needed for cough. 04/23/18   Henreitta Leber, MD  hydrocortisone (ANUSOL-HC) 2.5 % rectal cream Place 1 application rectally 2 (two) times daily as needed for hemorrhoids.    [provider]  LORazepam (ATIVAN) 0.5 MG tablet Take 0.5 mg by mouth every 8 (eight) hours as needed for anxiety.    [provider]  LORazepam (ATIVAN) 2 MG/ML concentrated solution Take 0.5 mg by mouth every 2 (two) hours as needed for anxiety (agitation).    [provider]  losartan (COZAAR) 50 MG tablet Take 50 mg by mouth daily.    [provider]  methimazole (TAPAZOLE) 5 MG tablet Take 2.5 mg by mouth 2 (two) times daily.     [provider]  metoprolol succinate (TOPROL-XL) 25 MG 24 hr tablet Take 25 mg by mouth daily.    [provider]    Morphine Sulfate (MORPHINE CONCENTRATE) 10 mg / 0.5 ml concentrated solution Take 4 mg by mouth every 2 (two) hours as needed for severe pain.    [provider]  ondansetron (ZOFRAN-ODT) 4 MG disintegrating tablet Take 4 mg by mouth every 4 (four) hours as needed for nausea or vomiting.    [provider]  oxyCODONE (OXY IR/ROXICODONE) 5 MG immediate release tablet Take 2.5-5 mg by mouth every 6 (six) hours as needed for severe pain.    [provider]  Polyethyl Glycol-Propyl Glycol (SYSTANE) 0.4-0.3 % SOLN Place 1 drop into  both eyes every 4 (four) hours as needed (dry eyes).    [provider]  polyethylene glycol (MIRALAX / GLYCOLAX) packet Take 17 g by mouth daily.    [provider]  sennosides-docusate sodium (SENOKOT-S) 8.6-50 MG tablet Take 1 tablet by mouth 2 (two) times daily as needed for constipation. 04/23/18   Henreitta Leber, MD  Vitamins A & D (VITAMIN A & D) ointment Apply 1 application topically 2 (two) times daily as needed for dry skin (itchy skin).    [provider]  Zinc Oxide 13 % CREA Place 1 application rectally 4 (four) times daily as needed (painful hemorrhoids).    [provider]    Physical Exam: Vitals:   05/31/19 0501 05/31/19 0524  BP: 134/84   Pulse: 94   Resp: 13   Temp: 98.1 F (36.7 C)   TempSrc: Oral   SpO2: 98%   Weight:  64.1 kg  Height:  5\' 3"  (1.6 m)     Vitals:   05/31/19 0501 05/31/19 0524  BP: 134/84   Pulse: 94   Resp: 13   Temp: 98.1 F (36.7 C)   TempSrc: Oral   SpO2: 98%   Weight:  64.1 kg  Height:  5\' 3"  (1.6 m)    Constitutional: NAD, Lethargic but arouses to loud verbal stimuli. Not following commands Eyes: PERRL, lids and conjunctivae normal ENMT: Mucous membranes are moist.  Neck: normal, supple, no masses, no thyromegaly Respiratory: Bilateral air entry, no wheezing, no crackles. Normal respiratory effort. No accessory muscle use. Patient has a moist  cough Cardiovascular: Irregularly irregular, no murmurs / rubs / gallops. No extremity edema. 2+ pedal pulses. No carotid bruits.  Abdomen: no tenderness, no masses palpated. No hepatosplenomegaly. Bowel sounds positive.  Musculoskeletal: no clubbing / cyanosis. No joint deformity upper and lower extremities.  Skin: no rashes, lesions, ulcers.  Neurologic: Unable to assess.  Patient not able to follow commands Psychiatric: Normal mood and affect.   Labs on Admission: I have personally reviewed following labs and imaging studies  CBC: Recent Labs  Lab 05/31/19 0452  WBC 16.4*  NEUTROABS 11.8*  HGB 11.5*  HCT 35.8*  MCV 100.0  PLT 992   Basic Metabolic Panel: Recent Labs  Lab 05/31/19 0452  NA 140  K 3.8  CL 106  CO2 24  GLUCOSE 176*  BUN 27*  CREATININE 1.25*  CALCIUM 9.1   GFR: Estimated Creatinine Clearance: 25.3 mL/min (A) (by C-G formula based on SCr of 1.25 mg/dL (H)). Liver Function Tests: Recent Labs  Lab 05/31/19 0452  AST 21  ALT 8  ALKPHOS 57  BILITOT 0.6  PROT 7.2  ALBUMIN 3.9   No results for input(s): LIPASE, AMYLASE in the last 168 hours. No results for input(s): AMMONIA in the last 168 hours. Coagulation Profile: Recent Labs  Lab 05/31/19 0452  INR 1.2   Cardiac Enzymes: No results for input(s): CKTOTAL, CKMB, CKMBINDEX, TROPONINI in the last 168 hours. BNP (last 3 results) No results for input(s): PROBNP in the last 8760 hours. HbA1C: No results for input(s): HGBA1C in the last 72 hours. CBG: No results for input(s): GLUCAP in the last 168 hours. Lipid Profile: No results for input(s): CHOL, HDL, LDLCALC, TRIG, CHOLHDL, LDLDIRECT in the last 72 hours. Thyroid Function Tests: No results for input(s): TSH, T4TOTAL, FREET4, T3FREE, THYROIDAB in the last 72 hours. Anemia Panel: No results for input(s): VITAMINB12, FOLATE, FERRITIN, TIBC, IRON, RETICCTPCT in the last 72 hours. Urine analysis:  Component Value Date/Time   COLORURINE  YELLOW (A) 05/31/2019 0655   APPEARANCEUR CLEAR (A) 05/31/2019 0655   APPEARANCEUR Clear 06/06/2014 1015   LABSPEC 1.017 05/31/2019 0655   LABSPEC 1.017 06/06/2014 1015   PHURINE 6.0 05/31/2019 0655   GLUCOSEU NEGATIVE 05/31/2019 0655   GLUCOSEU Negative 06/06/2014 1015   HGBUR LARGE (A) 05/31/2019 0655   BILIRUBINUR NEGATIVE 05/31/2019 0655   BILIRUBINUR neg 10/20/2014 1001   BILIRUBINUR Negative 06/06/2014 1015   KETONESUR NEGATIVE 05/31/2019 0655   PROTEINUR 30 (A) 05/31/2019 0655   UROBILINOGEN 0.2 10/20/2014 1001   NITRITE NEGATIVE 05/31/2019 0655   LEUKOCYTESUR NEGATIVE 05/31/2019 0655   LEUKOCYTESUR Negative 06/06/2014 1015    Radiological Exams on Admission: CT HEAD WO CONTRAST  Result Date: 05/31/2019 CLINICAL DATA:  Neuro deficit. Acute stroke suspected. Acute mental status change and weakness. EXAM: CT HEAD WITHOUT CONTRAST TECHNIQUE: Contiguous axial images were obtained from the base of the skull through the vertex without intravenous contrast. COMPARISON:  December 20, 2017 FINDINGS: Brain: No subdural, epidural, or subarachnoid hemorrhage. Ventricles and sulci are stable. White matter changes are centrally stable. A lacunar infarct in the right basal ganglia is unchanged. An infarct in the left parietal temporal region is unchanged. No acute cortical ischemia or infarct identified. Cerebellum, brainstem, and basal cisterns are normal. No mass effect or midline shift. Vascular: Calcified atherosclerosis is seen in the intracranial carotids. Skull: Normal. Negative for fracture or focal lesion. Sinuses/Orbits: Chronic opacification of the right frontal sinus with surrounding bony sclerosis is stable. No other sinus disease identified. Other: None. IMPRESSION: 1. Chronic white matter changes and left parietal temporal infarct. No acute ischemia or infarct identified. 2. Chronic opacification the right frontal sinus with surrounding bony sclerosis, unchanged. Electronically Signed    By: Dorise Bullion III M.D   On: 05/31/2019 05:32    EKG: Independently reviewed.  Atrial fibrillation PVCs  Assessment/Plan Principal Problem:   CVA (cerebral vascular accident) ([place]) Active Problems:   Essential (primary) hypertension   CKD (chronic kidney disease) stage 3, GFR 30-59 ml/min   Leukocytosis   Atrial fibrillation with RVR ([place])    Acute CVA Patient has a history of atrial fibrillation with a prior stroke She presents to the emergency room  for evaluation of dysarthria and left-sided facial droop CT scan of the head without contrast is negative for intracranial hemorrhage CTA not done per family's request Patient not a candidate for TPA since she is on Eliquis Will place patient on Aspirin 300mg  per rectum Will obtain MRI of the brain, 2D Echocardiogram and Carotid Doppler Will request speech and PT consult. At baseline patient is non ambulatory Consult neurology   Essential Hypertension with complications of chronic kidney disease stage III Will allow for permissive hypertension since patient has an acute stroke Renal Function appears to be at baseline Hold antihypertensive medications metoprolol, amlodipine and Losartan    History of Atrial Fibrillation Patient on chronic anticoagulation therapy with Eliquis for secondary prophylaxis for an acute stroke Will hold Eliquis for now Hold metoprolol Will consult cardiology for further recommendation regarding anticoagulation therapy    Hyperthyroidism Hold methimazole until patient is seen and evaluated by speech therapy    Dementia Hold Donepezil until patient is seen and evaluated by speech therapy    Leukocytosis Unclear etiology Patient has a wet cough and is at high risk for aspiration Strict aspiration precautions Obtain CXR She is afebrile. Will monitor closely  DVT prophylaxis: SCD Code Status: DNR Family Communication: Plan  of care was discussed with patient's daughter over the  phone. She verbalizes understanding and agrees with the plan Disposition Plan: Back to previous home environment Consults called: Neurology, Cardiology    Collier Bullock MD [facility]     05/31/2019, 7:53 AM

## 2019-05-31 NOTE — ED Triage Notes (Signed)
Patient presents to Emergency Department via [facility] from home with complaints of AMS  Per EMS call to house because the pt was "not breathing - CPR was done by family" - EMS found pt with pulse  Per daughter at bedside phone call with other family reports normal speech approx 4am when pt asked to "go to toilet"

## 2019-05-31 NOTE — ED Triage Notes (Signed)
Pt to CT

## 2019-05-31 NOTE — Evaluation (Signed)
Physical Therapy Evaluation Patient Details Name: Katelyn [name] MRN: [id number] DOB: [birth year] Today's Date: 05/31/2019   History of Present Illness  86 y.o. female with medical history significant for hypertension, chronic kidney disease stage 3 , atrial fibrillation on chronic anticoagulation therapy, peripheral arterial disease and history of CVA who was brought into the hospital by EMS for evaluation of mental status changes.  Morning of admit pt tried to wake her and she was unresponsive and reported L facial droop, and garbled speech.  Clinical Impression  Pt laying in bed with 2 daughter's present (not the daughter she lives with).  She initially showed some hesitancy to do a lot with PT but ultimately participated well and showed good effort.  Pt does, indeed, have garbled speed but family reports that she seems to be essentially back to her baseline in this regard.  Pt needed to go to the bathroom and was able to sit up with only light assist, able to get herself to [facility] w/o direct physical assist.  Despite family reporting that she has not walked in ~1 year we tried and she went ~10 ft and likely could have done more if motivated to do so, but asked to stop. O2 in the 90s (on room air) HR in the 80s and only minimal c/o fatigue.  Pt should be able to safely return home with HHPT once medically cleared for d/c, she does have 24/7 assist from daughter/family.      Follow Up Recommendations Home health PT;Supervision for mobility/OOB    Equipment Recommendations  None recommended by PT    Recommendations for Other Services       Precautions / Restrictions Precautions Precautions: Fall Restrictions Weight Bearing Restrictions: No      Mobility  Bed Mobility Overal bed mobility: Needs Assistance Bed Mobility: Supine to Sit     Supine to sit: Min assist     General bed mobility comments: Pt showed good effort in getting to EOB, able to maintain sitting balance once  assist to square up EOB  Transfers Overall transfer level: Needs assistance Equipment used: 1 person hand held assist(holding onto [facility] for transfer) Transfers: Sit to/from [employer] to Stand: Min assist Stand pivot transfers: Min guard       General transfer comment: Pt did surprisingly well with transfer to the [facility] using rails and needing only CGA and a lot of cuing for safety awareness.  Needed only light assist in getting to standing, cues for appropriate walker use/positioning  Ambulation/Gait Ambulation/Gait assistance: Min assist Gait Distance (Feet): 10 Feet Assistive device: Rolling walker (2 wheeled)       General Gait Details: Pt, who apparently has no walked in a year, was able to ambulatepast foot of bed with no buckling or LOBs and overall suprised her daughters and this PT with ability to maintain cadence and balance.   Stairs            Wheelchair Mobility    Modified Rankin (Stroke Patients Only)       Balance Overall balance assessment: Needs assistance Sitting-balance support: No upper extremity supported Sitting balance-Leahy Scale: Good     Standing balance support: Bilateral upper extremity supported Standing balance-Leahy Scale: Fair Standing balance comment: reliant on walker but no overt LOBs                             Pertinent Vitals/Pain Pain Assessment: No/denies pain  Home Living Family/patient expects to be discharged to:: Private residence Living Arrangements: Children Available Help at Discharge: Family Type of Home: [place]: One [place]: Environmental consultant - 2 wheels;Wheelchair - manual;Hospital bed;Bedside commode      Prior Function Level of Independence: Needs assistance   Gait / Transfers Assistance Needed: apparently pt has not walked in ~1 year, needs assist transferring to w/c, BSC  ADL's / Homemaking Assistance Needed: daughter  assist with most aspects of care, meals, etc        Hand Dominance        Extremity/Trunk Assessment   Upper Extremity Assessment Upper Extremity Assessment: Generalized weakness(age appropriate deficits, appears to be = bilaterally)    Lower Extremity Assessment Lower Extremity Assessment: Generalized weakness(age appropriate defecits, appears grossly = b/l)       Communication   Communication: (Pt with very garbled speech family reports now near baseline)  Cognition Arousal/Alertness: Awake/alert Behavior During Therapy: WFL for tasks assessed/performed Overall Cognitive Status: History of cognitive impairments - at baseline                                 General Comments: Pt did not know date, minimally aware of situation, family reports she seems near her baseline      General Comments      Exercises     Assessment/Plan    PT Assessment Patient needs continued PT services  PT Problem List Decreased strength;Decreased range of motion;Decreased activity tolerance;Decreased balance;Decreased mobility;Decreased coordination;Decreased cognition;Decreased knowledge of use of DME;Decreased safety awareness       PT Treatment Interventions DME instruction;Gait training;Stair training;Functional mobility training;Therapeutic activities;Therapeutic exercise;Balance training;Neuromuscular re-education;Patient/family education;Cognitive remediation;Wheelchair mobility training    PT Goals (Current goals can be found in the Care Plan section)  Acute Rehab PT Goals Patient Stated Goal: go home PT Goal Formulation: With patient Time For Goal Achievement: 06/14/19 Potential to Achieve Goals: Fair    Frequency Min 2X/week   Barriers to discharge        Co-evaluation               AM-PAC PT "6 Clicks" Mobility  Outcome Measure Help needed turning from your back to your side while in a flat bed without using bedrails?: A Little Help needed moving  from lying on your back to sitting on the side of a flat bed without using bedrails?: A Little Help needed moving to and from a bed to a chair (including a wheelchair)?: A Little Help needed standing up from a chair using your arms (e.g., wheelchair or bedside chair)?: A Little Help needed to walk in hospital room?: A Little Help needed climbing 3-5 steps with a railing? : Total 6 Click Score: 16    End of Session Equipment Utilized During Treatment: Gait belt Activity Tolerance: Patient tolerated treatment well;Patient limited by fatigue Patient left: with chair alarm set;with call bell/phone within reach Nurse Communication: Mobility status PT Visit Diagnosis: Muscle weakness (generalized) (M62.81);Difficulty in walking, not elsewhere classified (R26.2);Unsteadiness on feet (R26.81)    Time: 1355-1435 PT Time Calculation (min) (ACUTE ONLY): 40 min   Charges:   PT Evaluation $PT Eval Low Complexity: 1 Low PT Treatments $Therapeutic Activity: 8-22 mins        Kreg Shropshire, DPT 05/31/2019, 4:15 PM

## 2019-05-31 NOTE — ED Provider Notes (Signed)
[facility] Emergency Department Provider Note  ____________________________________________   First MD Initiated Contact with Patient 05/31/19 [phone]     (approximate)  I have reviewed the triage vital signs and the nursing notes.   HISTORY  Chief Complaint Altered Mental Status  Level 5 caveat:  history/ROS limited by acute/critical illness  HPI Katelyn [name] is a 86 y.o. female with medical history as listed below who is a [facility] patient and presents by EMS for altered mental status and strokelike symptoms.  The history provided by family to the paramedics was extremely limited and frankly disjointed.  The best they can determine is that the patient was her normal self when she went to bed (although the time she went to bed is unknown) and then she woke up in some degree of distress with garbled speech and what appears to be some left-sided facial droop.  The original EMS call was for cardiac arrest but the patient was awake and alert when they arrived.  She has DNR paperwork with her.  She is awake and answering questions but anything other than simple answers reveals garbled speech.  She seems to be indicating that her abdomen is hurting.  She is in no respiratory distress.         Past Medical History:  Diagnosis Date   Abdominal pain    Anxiety    Atherosclerosis    Chronic kidney disease    stage 3   Colon polyps    Constipation    Cough with expectoration    Dependent edema bilateral legs   Gait abnormality    Gout    Hearing loss    Hearing loss    Hemorrhoids    Hypertension    Incontinence    Osteoarthritis    Osteoarthritis    Osteoarthritis    Paget's disease of bone    Peripheral vascular disease ([place])    Pneumonia    Proteinuria    Stroke [facility])     Patient Active Problem List   Diagnosis Date Noted   Pressure injury of skin 04/22/2018   CAP (community acquired pneumonia) 04/21/2018    Altered mental status    PAD (peripheral artery disease) ([place]) 06/11/2016   CVA (cerebral vascular accident) ([place]) 12/23/2014   CKD (chronic kidney disease) stage 3, GFR 30-59 ml/min 10/20/2014   Hyperglycemia 10/20/2014   Anxiety, mild 07/05/2014   Essential (primary) hypertension 07/05/2014   Acquired hallux valgus 85/63/1497   Gastritis, Helicobacter pylori 02/63/7858   History of colon polyps 07/05/2014   Arthritis of knee, degenerative 07/05/2014    Past Surgical History:  Procedure Laterality Date   PARTIAL HYSTERECTOMY     PERIPHERAL VASCULAR CATHETERIZATION N/A 01/13/2015   Procedure: Abdominal Aortogram w/Lower Extremity;  Surgeon: Algernon Huxley, MD;  Location: [place] [facility];  Service: Cardiovascular;  Laterality: N/A;   PERIPHERAL VASCULAR CATHETERIZATION  01/13/2015   Procedure: Lower Extremity Intervention;  Surgeon: Algernon Huxley, MD;  Location: [place] [facility];  Service: Cardiovascular;;    Prior to Admission medications   Medication Sig Start Date End Date Taking? Authorizing Provider  acetaminophen (TYLENOL) 325 MG tablet Take 2 tablets (650 mg total) by mouth every 6 (six) hours as needed for mild pain (or Fever >/= 101). Patient not taking: Reported on 04/21/2018 01/29/17   Gladstone Lighter, MD  acetaminophen (TYLENOL) 650 MG CR tablet Take 1,300 mg by mouth 2 (two) times daily as needed for pain.  [provider]  alum & mag hydroxide-simeth (MAALOX PLUS) 400-400-40 MG/5ML suspension Take 5 mLs by mouth every 4 (four) hours as needed for indigestion.    [provider]  amLODipine (NORVASC) 5 MG tablet Take 1 tablet (5 mg total) by mouth daily. Patient not taking: Reported on 04/21/2018 12/27/14   Nicholes Mango, MD  apixaban (ELIQUIS) 2.5 MG TABS tablet Take 1 tablet (2.5 mg total) by mouth 2 (two) times daily. 01/13/15   Algernon Huxley, MD  atorvastatin (LIPITOR) 40 MG tablet Take 1 tablet (40 mg total) by mouth daily at 6  PM. Patient not taking: Reported on 04/21/2018 01/29/17   Gladstone Lighter, MD  atropine 1 % ophthalmic solution Place 2 drops under the tongue every hour as needed (excessive secretions).    [provider]  donepezil (ARICEPT) 10 MG tablet Take 1 tablet (10 mg total) by mouth daily. 04/23/18   Henreitta Leber, MD  fluticasone (FLONASE) 50 MCG/ACT nasal spray Place 2 sprays into both nostrils daily. 09/03/14   Glean Hess, MD  guaiFENesin-dextromethorphan (ROBITUSSIN DM) 100-10 MG/5ML syrup Take 5 mLs by mouth every 6 (six) hours as needed for cough. 04/23/18   Henreitta Leber, MD  hydrocortisone (ANUSOL-HC) 2.5 % rectal cream Place 1 application rectally 2 (two) times daily as needed for hemorrhoids.    [provider]  LORazepam (ATIVAN) 0.5 MG tablet Take 0.5 mg by mouth every 8 (eight) hours as needed for anxiety.    [provider]  LORazepam (ATIVAN) 2 MG/ML concentrated solution Take 0.5 mg by mouth every 2 (two) hours as needed for anxiety (agitation).    [provider]  losartan (COZAAR) 50 MG tablet Take 50 mg by mouth daily.    [provider]  methimazole (TAPAZOLE) 5 MG tablet Take 2.5 mg by mouth 2 (two) times daily.     [provider]  metoprolol succinate (TOPROL-XL) 25 MG 24 hr tablet Take 25 mg by mouth daily.    [provider]  Morphine Sulfate (MORPHINE CONCENTRATE) 10 mg / 0.5 ml concentrated solution Take 4 mg by mouth every 2 (two) hours as needed for severe pain.    [provider]  ondansetron (ZOFRAN-ODT) 4 MG disintegrating tablet Take 4 mg by mouth every 4 (four) hours as needed for nausea or vomiting.    [provider]  oxyCODONE (OXY IR/ROXICODONE) 5 MG immediate release tablet Take 2.5-5 mg by mouth every 6 (six) hours as needed for severe pain.    [provider]  Polyethyl Glycol-Propyl Glycol (SYSTANE) 0.4-0.3 % SOLN Place 1 drop into both eyes every 4 (four) hours as  needed (dry eyes).    [provider]  polyethylene glycol (MIRALAX / GLYCOLAX) packet Take 17 g by mouth daily.    [provider]  sennosides-docusate sodium (SENOKOT-S) 8.6-50 MG tablet Take 1 tablet by mouth 2 (two) times daily as needed for constipation. 04/23/18   Henreitta Leber, MD  Vitamins A & D (VITAMIN A & D) ointment Apply 1 application topically 2 (two) times daily as needed for dry skin (itchy skin).    [provider]  Zinc Oxide 13 % CREA Place 1 application rectally 4 (four) times daily as needed (painful hemorrhoids).    [provider]    Allergies Bee venom  Family History  Problem Relation Age of Onset   Diabetes Mother    Colon cancer Father    Cancer Sister  Cancer Sister    Cancer Sister     Social History Social History   Tobacco Use   Smoking status: Never Smoker   Smokeless tobacco: Never Used  Substance Use Topics   Alcohol use: No    Alcohol/week: 0.0 standard drinks   Drug use: No    Review of Systems Level 5 caveat:  history/ROS limited by acute/critical illness   ____________________________________________   PHYSICAL EXAM:  ED Triage Vitals  Enc Vitals Group     BP 05/31/19 0501 134/84     Pulse Rate 05/31/19 0501 94     Resp 05/31/19 0501 13     Temp 05/31/19 0501 98.1 F (36.7 C)     Temp Source 05/31/19 0501 Oral     SpO2 05/31/19 0501 98 %     Weight 05/31/19 0524 64.1 kg (141 lb 4.8 oz)     Height 05/31/19 0524 1.6 m (5\' 3" )     Head Circumference --      Peak Flow --      Pain Score --      Pain Loc --      Pain Edu? --      Excl. in [place]? --      Constitutional: Awake and alert, no apparent distress. Eyes: Conjunctivae are normal.  Head: Atraumatic. Nose: No congestion/rhinnorhea. Mouth/Throat: Patient is wearing a mask. Neck: No stridor.  No meningeal signs.   Cardiovascular: Normal rate, regular rhythm. Good peripheral circulation. Grossly normal heart  sounds. Respiratory: Normal respiratory effort.  No retractions. Gastrointestinal: Soft and nondistended.  Diffuse tenderness to palpation throughout the abdomen with no focal tenderness. Musculoskeletal: No lower extremity tenderness nor edema. No gross deformities of extremities. Neurologic: Patient appears to have left-sided facial droop and severely garbled speech.  Given the dysarthria it is difficult to appreciate whether or not she is aphasic as well.  She will answer some simple questions but any extended amount of speech is unintelligible.  She seems to have decreased movement of her left hand and arm but she does occasionally move the left arm.  She has difficulty following commands and it is not possible for me to obtain a thorough and exhaustive neuro exam. Skin:  Skin is warm, dry and intact.  Of note, the patient cannot follow commands to the extent that an NIH stroke scale is not beneficial.  The number will be artificially inflated far beyond the realm of her actual neurological deficits.  The patient has dysarthria and some left-sided facial droop and confusion with difficulty following commands.  However she is moving all 4 of her extremities.  ____________________________________________   LABS (all labs ordered are listed, but only abnormal results are displayed)  Labs Reviewed  COMPREHENSIVE METABOLIC PANEL - Abnormal; Notable for the following components:      Result Value   Glucose, Bld 176 (*)    BUN 27 (*)    Creatinine, Ser 1.25 (*)    GFR calc non Af Amer 37 (*)    GFR calc Af Amer 43 (*)    All other components within normal limits  CBC WITH DIFFERENTIAL/PLATELET - Abnormal; Notable for the following components:   WBC 16.4 (*)    RBC 3.58 (*)    Hemoglobin 11.5 (*)    HCT 35.8 (*)    Neutro Abs 11.8 (*)    All other components within normal limits  PROTIME-INR - Abnormal; Notable for the following components:   Prothrombin Time 15.3 (*)  All other  components within normal limits  URINALYSIS, ROUTINE W REFLEX MICROSCOPIC - Abnormal; Notable for the following components:   Color, Urine YELLOW (*)    APPearance CLEAR (*)    Hgb urine dipstick LARGE (*)    Protein, ur 30 (*)    RBC / HPF >50 (*)    Bacteria, UA RARE (*)    All other components within normal limits  SARS CORONAVIRUS 2 (TAT 6-24 HRS)  URINE CULTURE  APTT  ETHANOL  URINE DRUG SCREEN, QUALITATIVE ([facility] ONLY)  LACTIC ACID, PLASMA  LACTIC ACID, PLASMA  PROCALCITONIN  TROPONIN I (HIGH SENSITIVITY)   ____________________________________________  EKG  ED ECG REPORT I, Hinda Kehr, the attending physician, personally viewed and interpreted this ECG.  Date: 05/31/2019 EKG Time: 5:02 AM Rate: 94 Rhythm: Initially it appears like a sinus rhythm but I believe there is an underlying atrial flutter. QRS Axis: normal Intervals: LVH with secondary repolarization abnormality and occasional PVC ST/T Wave abnormalities: Non-specific ST segment / T-wave changes, but no clear evidence of acute ischemia. Narrative Interpretation: no definitive evidence of acute ischemia; does not meet STEMI criteria.   ____________________________________________  RADIOLOGY I, Hinda Kehr, personally viewed and evaluated these images (plain radiographs) as part of my medical decision making, as well as reviewing the written report by the radiologist.  ED MD interpretation: No acute nor emergent abnormality identified on noncontrast head CT.  Official radiology report(s): CT HEAD WO CONTRAST  Result Date: 05/31/2019 CLINICAL DATA:  Neuro deficit. Acute stroke suspected. Acute mental status change and weakness. EXAM: CT HEAD WITHOUT CONTRAST TECHNIQUE: Contiguous axial images were obtained from the base of the skull through the vertex without intravenous contrast. COMPARISON:  December 20, 2017 FINDINGS: Brain: No subdural, epidural, or subarachnoid hemorrhage. Ventricles and sulci are  stable. White matter changes are centrally stable. A lacunar infarct in the right basal ganglia is unchanged. An infarct in the left parietal temporal region is unchanged. No acute cortical ischemia or infarct identified. Cerebellum, brainstem, and basal cisterns are normal. No mass effect or midline shift. Vascular: Calcified atherosclerosis is seen in the intracranial carotids. Skull: Normal. Negative for fracture or focal lesion. Sinuses/Orbits: Chronic opacification of the right frontal sinus with surrounding bony sclerosis is stable. No other sinus disease identified. Other: None. IMPRESSION: 1. Chronic white matter changes and left parietal temporal infarct. No acute ischemia or infarct identified. 2. Chronic opacification the right frontal sinus with surrounding bony sclerosis, unchanged. Electronically Signed   By: Dorise Bullion III M.D   On: 05/31/2019 05:32    ____________________________________________   PROCEDURES   Procedure(s) performed (including Critical Care):  Procedures   ____________________________________________   INITIAL IMPRESSION / MDM / [place] / ED COURSE  As part of my medical decision making, I reviewed the following data within the [place] History obtained from family, Nursing notes reviewed and incorporated, Labs reviewed , EKG interpreted , Old chart reviewed and Notes from prior ED visits   Differential diagnosis includes, but is not limited to, CVA/TIA, acute intracranial bleeding, vasovagal episode, cardiac arrhythmia, acute infection/sepsis.  History is extremely limited at this time but I appreciate a facial droop and dysarthria which apparently is new for the patient.  However given the limited history and the patient's limited ability to cooperate with an exam, rather than call a code stroke (when the report is an unknown time of onset prior to her going to bed) and to have a teleneurology evaluation that  is unlikely to  be of any use, I will proceed with an emergent head CT without contrast and standard lab work and then reassess.  Hopefully in the meantime will have additional information from family.      Clinical Course as of May 31 609  Sun May 31, 2019  0543 no acute abnormalities  CT HEAD WO CONTRAST [CF]  [phone] (Note that documentation was delayed due to multiple ED patients requiring immediate care.)  The patient's daughter, Bonnita Nasuti, arrived and she is the one that apparently makes most of the decisions for the patient although there is another adult child that is more directly involved with the care of the patient.  Bonnita Nasuti explains that as of 4:00 AM, the patient was apparently speaking clearly and was asked to be helped to the bathroom.  She had some sort of episode where she became unresponsive while on the toilet which is what led the family to believe that she had died and they called EMS.  She was back awake and alert upon waking up but she had the dysarthria and left-sided facial droop that we are still seeing now although it is improved somewhat.Bonnita Nasuti confirmed the patient is DNR and also said that she would not want to proceed with any aggressive intervention.  We talked about various options, including aggressively evaluating for the possibility of an LVO which would require intervention, and she said "no, I just make her comfortable."  She also confirmed that the patient does take Eliquis which means she is not a candidate for TPA regardless.  At that point I decided not to proceed with additional aggressive stroke work-up in the emergency department instead will focus on ruling out other causes and admit her for neurology evaluation and standard CVA inpatient work-up.  The patient's daughter Bonnita Nasuti agrees with this plan.  I have asked the nurses to perform in and out catheterization.  The patient is hemodynamically stable and her neurological symptoms are improving but her speech is still somewhat  garbled with some left-sided facial droop compared to baseline per daughter.   [CF]  [phone] The hospitalist team was consulted at 6:17 AM.   [CF]  0715 Hematuria without evidence of infection.  Adding on urine culture to be safe.  Urinalysis, Routine w reflex microscopic(!) [CF]    Clinical Course User Index [CF] Hinda Kehr, MD     ____________________________________________  FINAL CLINICAL IMPRESSION(S) / ED DIAGNOSES  Final diagnoses:  Cerebrovascular accident (CVA), unspecified mechanism ([place])  Altered mental status, unspecified altered mental status type  Hematuria, unspecified type  Current use of long term anticoagulation     MEDICATIONS GIVEN DURING THIS VISIT:  Medications - No data to display   ED Discharge Orders    None      *Please note:  Katelyn [name] was evaluated in Emergency Department on 05/31/2019 for the symptoms described in the history of present illness. She was evaluated in the context of the global COVID-19 pandemic, which necessitated consideration that the patient might be at risk for infection with the SARS-CoV-2 virus that causes COVID-19. Institutional protocols and algorithms that pertain to the evaluation of patients at risk for COVID-19 are in a state of rapid change based on information released by regulatory bodies including the CDC and federal and state organizations. These policies and algorithms were followed during the patient's care in the ED.  Some ED evaluations and interventions may be delayed as a result of limited staffing during the pandemic.*  Note:  This document was prepared using Dragon voice recognition software and may include unintentional dictation errors.   Hinda Kehr, MD 06/01/19 [phone]

## 2019-06-01 ENCOUNTER — Inpatient Hospital Stay
Admit: 2019-06-01 | Discharge: 2019-06-01 | Disposition: A | Discharge status: Home / Self Care | Payer: Medicare (Managed Care) | Attending: Cardiovascular Disease | Admitting: Cardiovascular Disease

## 2019-06-01 DIAGNOSIS — T17908A Unspecified foreign body in respiratory tract, part unspecified causing other injury, initial encounter: Secondary | ICD-10-CM

## 2019-06-01 DIAGNOSIS — I639 Cerebral infarction, unspecified: Secondary | ICD-10-CM

## 2019-06-01 DIAGNOSIS — R319 Hematuria, unspecified: Secondary | ICD-10-CM

## 2019-06-01 DIAGNOSIS — R4182 Altered mental status, unspecified: Secondary | ICD-10-CM

## 2019-06-01 DIAGNOSIS — N183 Chronic kidney disease, stage 3 unspecified: Secondary | ICD-10-CM

## 2019-06-01 LAB — LIPID PANEL
Cholesterol: 148 mg/dL (ref 0–200)
HDL: 50 mg/dL (ref >40)
LDL Cholesterol: 89 mg/dL (ref 0–99)
Total CHOL/HDL Ratio: 3 RATIO
Triglycerides: 47 mg/dL (ref <150)
VLDL: 9 mg/dL (ref 0–40)

## 2019-06-01 LAB — URINE CULTURE
Culture: NO GROWTH
Special Requests: NORMAL

## 2019-06-01 LAB — HEMOGLOBIN A1C
Hgb A1c MFr Bld: 5.7 % — ABNORMAL HIGH (ref 4.8–5.6)
Mean Plasma Glucose: 116.89 mg/dL

## 2019-06-01 MED ORDER — MENTHOL 3 MG MT LOZG
1.0000 | LOZENGE | OROMUCOSAL | Status: DC | PRN
  Filled 2019-06-01: qty 9

## 2019-06-01 MED ORDER — AMLODIPINE BESYLATE 10 MG PO TABS
10.0000 mg | ORAL_TABLET | Freq: Every day | ORAL | Status: DC
  Administered 2019-06-02: 11:00:00 10 mg via ORAL
  Filled 2019-06-01: qty 1

## 2019-06-01 MED ORDER — FAMOTIDINE 20 MG PO TABS
20.0000 mg | ORAL_TABLET | Freq: Every day | ORAL | Status: DC
  Administered 2019-06-02: 11:00:00 20 mg via ORAL
  Filled 2019-06-01: qty 1

## 2019-06-01 MED ORDER — ASPIRIN EC 325 MG PO TBEC
325.0000 mg | DELAYED_RELEASE_TABLET | Freq: Every day | ORAL | Status: DC
  Administered 2019-06-02: 11:00:00 325 mg via ORAL
  Filled 2019-06-01: qty 1

## 2019-06-01 MED ORDER — ONDANSETRON HCL 4 MG/2ML IJ SOLN
4.0000 mg | Freq: Four times a day (QID) | INTRAMUSCULAR | Status: DC | PRN
  Administered 2019-06-01: 4 mg via INTRAVENOUS
  Filled 2019-06-01: qty 2

## 2019-06-01 MED ORDER — POLYETHYLENE GLYCOL 3350 17 G PO PACK
17.0000 g | PACK | Freq: Every day | ORAL | Status: DC
  Administered 2019-06-01 – 2019-06-02 (×2): 17 g via ORAL
  Filled 2019-06-01 (×2): qty 1

## 2019-06-01 MED ORDER — DONEPEZIL HCL 5 MG PO TABS
10.0000 mg | ORAL_TABLET | Freq: Every day | ORAL | Status: DC
  Administered 2019-06-01 – 2019-06-02 (×2): 10 mg via ORAL
  Filled 2019-06-01 (×2): qty 2

## 2019-06-01 MED ORDER — METHIMAZOLE 5 MG PO TABS
2.5000 mg | ORAL_TABLET | Freq: Two times a day (BID) | ORAL | Status: DC
  Administered 2019-06-01 – 2019-06-02 (×3): 2.5 mg via ORAL
  Filled 2019-06-01 (×4): qty 1

## 2019-06-01 MED ORDER — AMLODIPINE BESYLATE 5 MG PO TABS: 5.0000 mg | ORAL_TABLET | Freq: Once | ORAL | Status: DC

## 2019-06-01 MED ORDER — APIXABAN 2.5 MG PO TABS
2.5000 mg | ORAL_TABLET | Freq: Two times a day (BID) | ORAL | Status: DC
  Administered 2019-06-01 – 2019-06-02 (×3): 2.5 mg via ORAL
  Filled 2019-06-01 (×3): qty 1

## 2019-06-01 NOTE — Plan of Care (Signed)
Education provided to family as patient is confused

## 2019-06-01 NOTE — Progress Notes (Signed)
PT Cancellation Note  Patient Details Name: Katelyn [name] MRN: [id number] DOB: Aug 08, 1923   Cancelled Treatment:    Reason Eval/Treat Not Completed: Other (comment). Treatment attempted however pt currently receiving echo and is unavailable for therapy. Will re-attempt next available date.   Sparsh Callens 06/01/2019, 10:45 AM  Greggory Stallion, PT, DPT [phone]

## 2019-06-01 NOTE — Progress Notes (Signed)
PROGRESS NOTE    Katelyn [name]  FWY:[id number] DOB: [birth year] DOA: 05/31/2019 PCP: Abelardo Diesel, NP (Inactive)    Brief Narrative:  Katelyn [name] is a 86 y.o. female with medical history significant for hypertension, chronic kidney disease stage 3 , atrial fibrillation on chronic anticoagulation therapy, peripheral arterial disease and history of CVA who was brought into the hospital by EMS for evaluation of mental status changes. EMS notes that the patient had garbled speech and a left-sided facial droop when they arrived so she was brought to the ER for further evaluation. She had a CT  scan of the head without contrast which showed no intracranial hemorrhage but showed chronic white matter changes and left parietal temporal infarct.   Consultants:   neurology  Procedures: CT head 1. Chronic white matter changes and left parietal temporal infarct. No acute ischemia or infarct identified. 2. Chronic opacification the right frontal sinus with surrounding bony sclerosis, unchanged  MRI brain: 1. Intermittently motion degraded examination. 2. No evidence of acute intracranial abnormality, including acute infarction. 3. Remote, small chronic cortically based left parietooccipital infarct. 4. Moderate chronic small vessel ischemic disease. Chronic right basal ganglia lacunar infarcts. 5. Mild generalized parenchymal atrophy. 6. Paranasal sinus disease, most notably of the right frontal sinus. 7. Trace left mastoid effusion.  Antimicrobials:      Subjective: OT at bedside. Pt eating breakfast. Hard of hearing. Denies dizziness, lightheadeness. Soft spoken in low voice, able to understand but difficult.  Objective: Vitals:   05/31/19 1048 05/31/19 1612 06/01/19 0042 06/01/19 0859  BP: (!) 172/70 (!) 196/70 (!) 168/70 (!) 185/76  Pulse: 74 78 68 63  Resp: 18 17 14 18   Temp: 98.6 F (37 C) 98.9 F (37.2 C) (!) 97.5 F (36.4 C) 98.6 F (37 C)  TempSrc:   Oral Oral   SpO2: 91% 98% 100% 100%  Weight:      Height:        Intake/Output Summary (Last 24 hours) at 06/01/2019 2774 Last data filed at 05/31/2019 2300 Gross per 24 hour  Intake 6.24 ml  Output --  Net 6.24 ml   Filed Weights   05/31/19 0524  Weight: 64.1 kg    Examination:  General exam: Appears calm and comfortable , nad Respiratory system: Clear to auscultation. Respiratory effort normal. Cardiovascular system: S1 & S2 heard, RRR. No JVD, murmurs, rubs, gallops or clicks.  Gastrointestinal system: Abdomen is nondistended, soft and nontender.  Normal bowel sounds heard. Central nervous system: Alert and oriented x3. Difficult to assess as not participating well with neuro exam. Strength globally good. No facial drop. extermity movements appears well.  Extremities: no edema Skin: warm and dry Psychiatry: Mood & affect appropriate in current setting.     Data Reviewed: I have personally reviewed following labs and imaging studies  CBC: Recent Labs  Lab 05/31/19 0452  WBC 16.4*  NEUTROABS 11.8*  HGB 11.5*  HCT 35.8*  MCV 100.0  PLT 128   Basic Metabolic Panel: Recent Labs  Lab 05/31/19 0452  NA 140  K 3.8  CL 106  CO2 24  GLUCOSE 176*  BUN 27*  CREATININE 1.25*  CALCIUM 9.1   GFR: Estimated Creatinine Clearance: 25.3 mL/min (A) (by C-G formula based on SCr of 1.25 mg/dL (H)). Liver Function Tests: Recent Labs  Lab 05/31/19 0452  AST 21  ALT 8  ALKPHOS 57  BILITOT 0.6  PROT 7.2  ALBUMIN 3.9   No results for input(s): LIPASE, AMYLASE in  the last 168 hours. No results for input(s): AMMONIA in the last 168 hours. Coagulation Profile: Recent Labs  Lab 05/31/19 0452  INR 1.2   Cardiac Enzymes: No results for input(s): CKTOTAL, CKMB, CKMBINDEX, TROPONINI in the last 168 hours. BNP (last 3 results) No results for input(s): PROBNP in the last 8760 hours. HbA1C: No results for input(s): HGBA1C in the last 72 hours. CBG: No results for  input(s): GLUCAP in the last 168 hours. Lipid Profile: Recent Labs    06/01/19 0534  CHOL 148  HDL 50  LDLCALC 89  TRIG 47  CHOLHDL 3.0   Thyroid Function Tests: No results for input(s): TSH, T4TOTAL, FREET4, T3FREE, THYROIDAB in the last 72 hours. Anemia Panel: No results for input(s): VITAMINB12, FOLATE, FERRITIN, TIBC, IRON, RETICCTPCT in the last 72 hours. Sepsis Labs: Recent Labs  Lab 05/31/19 0452 05/31/19 0747  PROCALCITON 0.21  --   LATICACIDVEN  --  2.7*    Recent Results (from the past 240 hour(s))  SARS CORONAVIRUS 2 (TAT 6-24 HRS) Nasopharyngeal Nasopharyngeal Swab     Status: None   Collection Time: 05/31/19  7:02 AM   Specimen: Nasopharyngeal Swab  Result Value Ref Range Status   SARS Coronavirus 2 NEGATIVE NEGATIVE Final    Comment: (NOTE) SARS-CoV-2 target nucleic acids are NOT DETECTED. The SARS-CoV-2 RNA is generally detectable in upper and lower respiratory specimens during the acute phase of infection. Negative results do not preclude SARS-CoV-2 infection, do not rule out co-infections with other pathogens, and should not be used as the sole basis for treatment or other patient management decisions. Negative results must be combined with clinical observations, patient history, and epidemiological information. The expected result is Negative. Fact Sheet for Patients: SugarRoll.be Fact Sheet for Healthcare Providers: https://www.woods-mathews.com/ This test is not yet approved or cleared by the [place] FDA and  has been authorized for detection and/or diagnosis of SARS-CoV-2 by FDA under an Emergency Use Authorization (EUA). This EUA will remain  in effect (meaning this test can be used) for the duration of the COVID-19 declaration under Section 56 4(b)(1) of the Act, 21 U.S.C. section 360bbb-3(b)(1), unless the authorization is terminated or revoked sooner. Performed at [facility], [place]  [address] [place], [place] [address]          Radiology Studies: CT HEAD WO CONTRAST  Result Date: 05/31/2019 CLINICAL DATA:  Neuro deficit. Acute stroke suspected. Acute mental status change and weakness. EXAM: CT HEAD WITHOUT CONTRAST TECHNIQUE: Contiguous axial images were obtained from the base of the skull through the vertex without intravenous contrast. COMPARISON:  December 20, 2017 FINDINGS: Brain: No subdural, epidural, or subarachnoid hemorrhage. Ventricles and sulci are stable. White matter changes are centrally stable. A lacunar infarct in the right basal ganglia is unchanged. An infarct in the left parietal temporal region is unchanged. No acute cortical ischemia or infarct identified. Cerebellum, brainstem, and basal cisterns are normal. No mass effect or midline shift. Vascular: Calcified atherosclerosis is seen in the intracranial carotids. Skull: Normal. Negative for fracture or focal lesion. Sinuses/Orbits: Chronic opacification of the right frontal sinus with surrounding bony sclerosis is stable. No other sinus disease identified. Other: None. IMPRESSION: 1. Chronic white matter changes and left parietal temporal infarct. No acute ischemia or infarct identified. 2. Chronic opacification the right frontal sinus with surrounding bony sclerosis, unchanged. Electronically Signed   By: Dorise Bullion III M.D   On: 05/31/2019 05:32   MR BRAIN WO CONTRAST  Result Date:  05/31/2019 CLINICAL DATA:  Ataxia, stroke suspected. EXAM: MRI HEAD WITHOUT CONTRAST TECHNIQUE: Multiplanar, multiecho pulse sequences of the brain and surrounding structures were obtained without intravenous contrast. COMPARISON:  Head CT performed earlier the same day 05/31/2019, brain MRI 01/28/2017 FINDINGS: Brain: The examination is intermittently motion degraded. Most notably, there is moderate motion degradation of the axial T1 weighted sequence. There is no evidence of acute infarct. No evidence of intracranial mass. No  midline shift or extra-axial fluid collection. No chronic intracranial blood products. Redemonstrated small cortically based left parietooccipital infarct. Moderate patchy and confluent T2/FLAIR hyperintensity within the cerebral white matter is nonspecific, but consistent with chronic small vessel ischemic disease. Redemonstrated chronic right basal ganglia lacunar infarcts. Stable, mild generalized parenchymal atrophy. Vascular: Flow voids maintained within the proximal large arterial vessels. Skull and upper cervical spine: No focal marrow lesion. Sinuses/Orbits: Visualized orbits demonstrate no acute abnormality. Paranasal sinus disease. Most notably there is near complete T2 hyperintense opacification of the right frontal sinus. Trace fluid within left mastoid air cells. IMPRESSION: 1. Intermittently motion degraded examination. 2. No evidence of acute intracranial abnormality, including acute infarction. 3. Remote, small chronic cortically based left parietooccipital infarct. 4. Moderate chronic small vessel ischemic disease. Chronic right basal ganglia lacunar infarcts. 5. Mild generalized parenchymal atrophy. 6. Paranasal sinus disease, most notably of the right frontal sinus. 7. Trace left mastoid effusion. Electronically Signed   By: Kellie Simmering DO   On: 05/31/2019 10:50   US Carotid Bilateral (at [facility] and [facility] only)  Result Date: 05/31/2019 CLINICAL DATA:  Altered mental status EXAM: BILATERAL CAROTID DUPLEX ULTRASOUND TECHNIQUE: Pearline Cables scale imaging, color Doppler and duplex ultrasound were performed of bilateral carotid and vertebral arteries in the neck. COMPARISON:  Prior duplex carotid ultrasound 12/23/2014; CT angiogram of the neck FINDINGS: Criteria: Quantification of carotid stenosis is based on velocity parameters that correlate the residual internal carotid diameter with NASCET-based stenosis levels, using the diameter of the distal internal carotid lumen as the denominator for stenosis  measurement. The following velocity measurements were obtained: RIGHT ICA: 61/23 cm/sec CCA: 51/02 cm/sec SYSTOLIC ICA/CCA RATIO:  1.1 ECA:  57 cm/sec LEFT ICA: 316/69 cm/sec CCA: 58/52 cm/sec SYSTOLIC ICA/CCA RATIO:  6.4 ECA:  190 cm/sec RIGHT CAROTID ARTERY: Minimal heterogeneous atherosclerotic plaque in the proximal internal carotid artery. By peak systolic velocity criteria, the estimated stenosis remains less than 50%. RIGHT VERTEBRAL ARTERY:  Patent with normal antegrade flow. LEFT CAROTID ARTERY: Bulky heterogeneous atherosclerotic plaque in the proximal internal carotid artery. There is significant elevation of the peak systolic velocity consistent with a greater than 70% diameter stenosis. There is associated spectral broadening, aliasing and high velocity jetting. LEFT VERTEBRAL ARTERY:  Patent with normal antegrade flow. IMPRESSION: 1. Severe (70-99%) stenosis proximal left internal carotid artery secondary to bulky heterogeneous and partially calcified atherosclerotic plaque. 2. Mild (1-49%) stenosis proximal right internal carotid artery secondary to heterogenous atherosclerotic plaque. 3. Vertebral arteries are patent with normal antegrade flow. Signed, Criselda Peaches, MD, [facility]iology Electronically Signed   By: Jacqulynn Cadet M.D.   On: 05/31/2019 14:43   DG Chest Port 1 View  Result Date: 05/31/2019 CLINICAL DATA:  Aspiration. Patient was not breathing. CPR done by family. EXAM: PORTABLE CHEST 1 VIEW COMPARISON:  04/21/2018 and CT of the neck on 01/27/2017 FINDINGS: The heart is enlarged. Superior mediastinal with significant displacement of the trachea to the RIGHT, consistent with multinodular goiter as seen on previous CT exam. There  is atherosclerotic calcification of the thoracic aorta. Lungs are clear. No pulmonary edema. Degenerative changes in the shoulders. IMPRESSION: Cardiomegaly.  No edema. Superior mediastinal  mass consistent with goiter. Significant deviation of the trachea to the RIGHT. Electronically Signed   By: Nolon Nations M.D.   On: 05/31/2019 09:45        Scheduled Meds: . amLODipine  5 mg Oral Daily  . apixaban  2.5 mg Oral BID  . aspirin  300 mg Rectal Daily  . atorvastatin  40 mg Oral q1800  . donepezil  10 mg Oral Daily  . fluticasone  2 spray Each Nare Daily  . losartan  50 mg Oral Daily  . methimazole  2.5 mg Oral BID  . metoprolol succinate  25 mg Oral Daily  . polyethylene glycol  17 g Oral Daily   Continuous Infusions: . sodium chloride 10 mL/hr at 05/31/19 2300    Assessment & Plan:   Principal Problem:   [facility] Active Problems:   Essential (primary) hypertension   CKD (chronic kidney disease) stage 3, GFR 30-59 ml/min   Leukocytosis   Atrial fibrillation with RVR ([place])   Acute CVA (cerebrovascular accident) ([place])   Hyperthyroidism  Acute CVA Patient has a history of atrial fibrillation with a prior stroke She presents to the emergency room  for evaluation of dysarthria and left-sided facial droop CT scan of the head without contrast is negative for intracranial hemorrhage CTA not done per family's request Patient not a candidate for TPA since she is on Eliquis Aspirin 325mg  po daily Echo pending MRI no acute infarct.  Please see full report Will obtain MRI of the brain, 2D  Carotid ultrasound with severe left internal carotid stenosis, vascular surgery consulted. Vascular recommended carotid stenting but daughter does not want to move forward with this . Vascular signs off.  Neurology consulted recommend orthostatic vitals, EEG, continue Eliquis and if work-up unremarkable no further neurologic intervention Speech therapy rec. Dysphagia 3 (mechanical soft)     Essential Hypertension with complications of chronic kidney disease stage III Will allow for permissive hypertension since patient has an acute stroke Renal  Function appears to be at baseline Restarted losartan , amlodipine, and beta blk Will increase amlodipine to 10mg     History of Atrial Fibrillation Patient on chronic anticoagulation therapy with Eliquis for secondary prophylaxis for an acute stroke Eliquis resumed. Metoprolol Resumed     Hyperthyroidism continue methimazole     Dementia Continue Donepezil     Leukocytosis Unclear etiology, reactive Patient has a wet cough and is at high risk for aspiration Strict aspiration precautions Obtain CXR   DVT prophylaxis: Eliquis Code Status:DNR Family Communication: none at bedside Disposition Plan: snf v.s. home. Awaiting PT evaluation.  Barrier: PT pending for snf vs home. bp still uncontrolled needs improvement as pt had cva. Possible dc in am       LOS: 1 day   Time spent:  45 min with >50% on coc    Nolberto Hanlon, MD [facility] Pager [phone]  If 7PM-7AM, please contact night-coverage [email or web address] Password [email or web address] 06/01/2019, 9:22 AM

## 2019-06-01 NOTE — Progress Notes (Signed)
*  PRELIMINARY RESULTS* Echocardiogram 2D Echocardiogram has been performed.  Katelyn [name] 06/01/2019, 11:02 AM

## 2019-06-01 NOTE — Progress Notes (Signed)
Pt found with rectal tube lying in bed, Balloon still inflated. Pt does not wish to have rectal tube reinserted.

## 2019-06-01 NOTE — Consult Note (Signed)
[facility] Vascular Consult Note  MRN : [id number]  Katelyn [name] Strength is a 86 y.o. (01-02-1924) female who presents with chief complaint of  Chief Complaint  Patient presents with  . Altered Mental Status   History of Present Illness: The patient is a 86 year old female with a past medical history significant for hypertension, chronic kidney disease stage 3, atrial fibrillation on chronic anticoagulation therapy, peripheral arterial disease, carotid disease with a history of CVA who was brought into the hospital by EMS for evaluation of mental status changes.  Most of the history was obtained from patient's daughter due to confusion. Per patient's daughter, her mother was in her usual state of health and had gone to bed last night, she was unsure of the time. Her sister who was staying at the house with her mother had gone to wake her mother up in the morning but noted she was not responding. She called her other sister who lives close by and she called EMS. EMS notes that the patient had garbled speech and a left-sided facial droop when they arrived.  In the ED, the patients daughter Bonnita Nasuti) confirmed the patient is DNR and also said that she would not want to proceed with any aggressive intervention. ED staff discussed various options, including aggressively evaluating for the possibility of an LVO which would require intervention, and she said "no, I just make her comfortable." Due to her daughters wishes at CTA of the head / neck was not performed.  Carotid Ultrasound (06/01/19): 1. Severe (70-99%) stenosis proximal left internal carotid artery secondary to bulky heterogeneous and partially calcified atherosclerotic plaque. 2. Mild (1-49%) stenosis proximal right internal carotid artery secondary to heterogenous atherosclerotic plaque. 3. Vertebral arteries are patent with normal antegrade flow.  Vascular Surgery was consulted by Dr. Kurtis Bushman for possible  recommendations.   Current Facility-Administered Medications  Medication Dose Route Frequency Provider Last Rate Last Admin  . 0.9 %  sodium chloride infusion   Intravenous Continuous Agbata, Tochukwu, MD 10 mL/hr at 05/31/19 2300 Rate Verify at 05/31/19 2300  . acetaminophen (TYLENOL) tablet 650 mg  650 mg Oral Q4H PRN Agbata, Tochukwu, MD       Or  . acetaminophen (TYLENOL) 160 MG/5ML solution 650 mg  650 mg Per Tube Q4H PRN Agbata, Tochukwu, MD       Or  . acetaminophen (TYLENOL) suppository 650 mg  650 mg Rectal Q4H PRN Agbata, Tochukwu, MD      . amLODipine (NORVASC) tablet 5 mg  5 mg Oral Daily Agbata, Tochukwu, MD   5 mg at 06/01/19 1030  . apixaban (ELIQUIS) tablet 2.5 mg  2.5 mg Oral BID Agbata, Tochukwu, MD   2.5 mg at 06/01/19 1030  . aspirin suppository 300 mg  300 mg Rectal Daily Agbata, Tochukwu, MD   300 mg at 05/31/19 1752  . atorvastatin (LIPITOR) tablet 40 mg  40 mg Oral q1800 Agbata, Tochukwu, MD   40 mg at 05/31/19 1750  . atropine 1 % ophthalmic solution 2 drop  2 drop Sublingual Q1H PRN Agbata, Tochukwu, MD      . donepezil (ARICEPT) tablet 10 mg  10 mg Oral Daily Agbata, Tochukwu, MD   10 mg at 06/01/19 1030  . fluticasone (FLONASE) 50 MCG/ACT nasal spray 2 spray  2 spray Each Nare Daily Agbata, Tochukwu, MD   2 spray at 06/01/19 1030  . hydrocortisone (ANUSOL-HC) 2.5 % rectal cream 1 application  1 application Rectal BID PRN Agbata,  Tochukwu, MD      . losartan (COZAAR) tablet 50 mg  50 mg Oral Daily Agbata, Tochukwu, MD   50 mg at 06/01/19 1030  . menthol-cetylpyridinium (CEPACOL) lozenge 3 mg  1 lozenge Oral PRN Lang Snow, NP      . methimazole (TAPAZOLE) tablet 2.5 mg  2.5 mg Oral BID Agbata, Tochukwu, MD   2.5 mg at 06/01/19 1030  . metoprolol succinate (TOPROL-XL) 24 hr tablet 25 mg  25 mg Oral Daily Agbata, Tochukwu, MD   25 mg at 06/01/19 1030  . polyethylene glycol (MIRALAX / GLYCOLAX) packet 17 g  17 g Oral Daily Agbata, Tochukwu, MD   17 g at  06/01/19 1030  . vitamin A & D ointment 1 application  1 application Topical BID PRN Agbata, Tochukwu, MD      . zinc oxide (BALMEX) 51.7 % cream 1 application  1 application Topical QID PRN Agbata, Tochukwu, MD       Past Medical History:  Diagnosis Date  . Abdominal pain   . Anxiety   . Atherosclerosis   . Chronic kidney disease    stage 3  . Colon polyps   . Constipation   . Cough with expectoration   . Dependent edema bilateral legs  . Gait abnormality   . Gout   . Hearing loss   . Hearing loss   . Hemorrhoids   . Hypertension   . Incontinence   . Osteoarthritis   . Osteoarthritis   . Osteoarthritis   . Paget's disease of bone   . Peripheral vascular disease ([place])   . Pneumonia   . Proteinuria   . Stroke [facility])    Past Surgical History:  Procedure Laterality Date  . PARTIAL HYSTERECTOMY    . PERIPHERAL VASCULAR CATHETERIZATION N/A 01/13/2015   Procedure: Abdominal Aortogram w/Lower Extremity;  Surgeon: Algernon Huxley, MD;  Location: [place] [facility];  Service: Cardiovascular;  Laterality: N/A;  . PERIPHERAL VASCULAR CATHETERIZATION  01/13/2015   Procedure: Lower Extremity Intervention;  Surgeon: Algernon Huxley, MD;  Location: [place] [facility];  Service: Cardiovascular;;   Social History Social History   Tobacco Use  . Smoking status: Never Smoker  . Smokeless tobacco: Never Used  Substance Use Topics  . Alcohol use: No    Alcohol/week: 0.0 standard drinks  . Drug use: No   Family History Family History  Problem Relation Age of Onset  . Diabetes Mother   . Colon cancer Father   . Cancer Sister   . Cancer Sister   . Cancer Sister   Denies family history of peripheral artery disease, venous disease or renal disease.  Allergies  Allergen Reactions  . Bee Venom Anaphylaxis   REVIEW OF SYSTEMS (Negative unless checked)  Constitutional: '[]' Weight loss  '[]' Fever  '[]' Chills Cardiac: '[]' Chest pain   '[]' Chest pressure   '[]' Palpitations   '[]' Shortness of breath when  laying flat   '[]' Shortness of breath at rest   '[]' Shortness of breath with exertion. Vascular:  '[]' Pain in legs with walking   '[]' Pain in legs at rest   '[]' Pain in legs when laying flat   '[]' Claudication   '[]' Pain in feet when walking  '[]' Pain in feet at rest  '[]' Pain in feet when laying flat   '[]' History of DVT   '[]' Phlebitis   '[]' Swelling in legs   '[]' Varicose veins   '[]' Non-healing ulcers Pulmonary:   '[]' Uses home oxygen   '[]' Productive cough   '[]' Hemoptysis   '[]' Wheeze  '[]' COPD   '[]'   Asthma Neurologic:  '[]' Dizziness  '[]' Blackouts   '[]' Seizures   '[x]' History of stroke   '[x]' History of TIA  '[]' Aphasia   '[]' Temporary blindness   '[]' Dysphagia   '[]' Weakness or numbness in arms   '[]' Weakness or numbness in legs Musculoskeletal:  '[]' Arthritis   '[]' Joint swelling   '[]' Joint pain   '[]' Low back pain Hematologic:  '[]' Easy bruising  '[]' Easy bleeding   '[]' Hypercoagulable state   '[]' Anemic  '[]' Hepatitis Gastrointestinal:  '[]' Blood in stool   '[]' Vomiting blood  '[]' Gastroesophageal reflux/heartburn   '[]' Difficulty swallowing. Genitourinary:  '[x]' Chronic kidney disease   '[]' Difficult urination  '[]' Frequent urination  '[]' Burning with urination   '[]' Blood in urine Skin:  '[]' Rashes   '[]' Ulcers   '[]' Wounds Psychological:  '[]' History of anxiety   '[]'  History of major depression.  Physical Examination  Vitals:   05/31/19 1048 05/31/19 1612 06/01/19 0042 06/01/19 0859  BP: (!) 172/70 (!) 196/70 (!) 168/70 (!) 185/76  Pulse: 74 78 68 63  Resp: '18 17 14 18  ' Temp: 98.6 F (37 C) 98.9 F (37.2 C) (!) 97.5 F (36.4 C) 98.6 F (37 C)  TempSrc:  Oral Oral   SpO2: 91% 98% 100% 100%  Weight:      Height:       Body mass index is 25.03 kg/m. Gen:  WD/WN, NAD, Answers some questions appropriately. Head: /AT, No temporalis wasting. Prominent temp pulse not noted. Ear/Nose/Throat: Hearing grossly intact, nares w/o erythema or drainage, oropharynx w/o Erythema/Exudate Eyes: Sclera non-icteric, conjunctiva clear Neck: Trachea midline.  No JVD. Bilateral bruit noted.   Pulmonary:  Good air movement, respirations not labored, equal bilaterally.  Cardiac: RRR, normal S1, S2. Vascular:  Vessel Right Left  Radial Palpable Palpable  Ulnar Palpable Palpable  Brachial Palpable Palpable  Carotid Palpable, without bruit Palpable, without bruit  Aorta Not palpable N/A  Femoral Palpable Palpable  Popliteal Palpable Palpable  PT Palpable Palpable  DP Palpable Palpable   Gastrointestinal: soft, non-tender/non-distended. No guarding/reflex.  Musculoskeletal: M/S 5/5 throughout.  Extremities without ischemic changes.  No deformity or atrophy. No edema. Neurologic: Sensation grossly intact in extremities.  Symmetrical.  Speech is fluent. Motor exam as listed above. Psychiatric: Judgment intact, Mood & affect appropriate for pt's clinical situation. Dermatologic: No rashes or ulcers noted.  No cellulitis or open wounds. Lymph : No Cervical, Axillary, or Inguinal lymphadenopathy.  CBC Lab Results  Component Value Date   WBC 16.4 (H) 05/31/2019   HGB 11.5 (L) 05/31/2019   HCT 35.8 (L) 05/31/2019   MCV 100.0 05/31/2019   PLT 222 05/31/2019   BMET    Component Value Date/Time   NA 140 05/31/2019 0452   NA 142 10/20/2014 1007   NA 139 06/06/2014 1002   K 3.8 05/31/2019 0452   K 4.0 06/06/2014 1002   CL 106 05/31/2019 0452   CL 105 06/06/2014 1002   CO2 24 05/31/2019 0452   CO2 24 06/06/2014 1002   GLUCOSE 176 (H) 05/31/2019 0452   GLUCOSE 170 (H) 06/06/2014 1002   BUN 27 (H) 05/31/2019 0452   BUN 19 10/20/2014 1007   BUN 34 (H) 06/06/2014 1002   CREATININE 1.25 (H) 05/31/2019 0452   CREATININE 1.44 (H) 06/06/2014 1002   CALCIUM 9.1 05/31/2019 0452   CALCIUM 9.0 06/06/2014 1002   GFRNONAA 37 (L) 05/31/2019 0452   GFRNONAA 32 (L) 06/06/2014 1002   GFRAA 43 (L) 05/31/2019 0452   GFRAA 37 (L) 06/06/2014 1002   Estimated Creatinine Clearance: 25.3 mL/min (A) (by C-G formula based on  SCr of 1.25 mg/dL (H)).  COAG Lab Results  Component Value Date    INR 1.2 05/31/2019   INR 1.24 04/21/2018   INR 1.14 01/27/2017   Radiology CT HEAD WO CONTRAST  Result Date: 05/31/2019 CLINICAL DATA:  Neuro deficit. Acute stroke suspected. Acute mental status change and weakness. EXAM: CT HEAD WITHOUT CONTRAST TECHNIQUE: Contiguous axial images were obtained from the base of the skull through the vertex without intravenous contrast. COMPARISON:  December 20, 2017 FINDINGS: Brain: No subdural, epidural, or subarachnoid hemorrhage. Ventricles and sulci are stable. White matter changes are centrally stable. A lacunar infarct in the right basal ganglia is unchanged. An infarct in the left parietal temporal region is unchanged. No acute cortical ischemia or infarct identified. Cerebellum, brainstem, and basal cisterns are normal. No mass effect or midline shift. Vascular: Calcified atherosclerosis is seen in the intracranial carotids. Skull: Normal. Negative for fracture or focal lesion. Sinuses/Orbits: Chronic opacification of the right frontal sinus with surrounding bony sclerosis is stable. No other sinus disease identified. Other: None. IMPRESSION: 1. Chronic white matter changes and left parietal temporal infarct. No acute ischemia or infarct identified. 2. Chronic opacification the right frontal sinus with surrounding bony sclerosis, unchanged. Electronically Signed   By: Dorise Bullion III M.D   On: 05/31/2019 05:32   MR BRAIN WO CONTRAST  Result Date: 05/31/2019 CLINICAL DATA:  Ataxia, stroke suspected. EXAM: MRI HEAD WITHOUT CONTRAST TECHNIQUE: Multiplanar, multiecho pulse sequences of the brain and surrounding structures were obtained without intravenous contrast. COMPARISON:  Head CT performed earlier the same day 05/31/2019, brain MRI 01/28/2017 FINDINGS: Brain: The examination is intermittently motion degraded. Most notably, there is moderate motion degradation of the axial T1 weighted sequence. There is no evidence of acute infarct. No evidence of  intracranial mass. No midline shift or extra-axial fluid collection. No chronic intracranial blood products. Redemonstrated small cortically based left parietooccipital infarct. Moderate patchy and confluent T2/FLAIR hyperintensity within the cerebral white matter is nonspecific, but consistent with chronic small vessel ischemic disease. Redemonstrated chronic right basal ganglia lacunar infarcts. Stable, mild generalized parenchymal atrophy. Vascular: Flow voids maintained within the proximal large arterial vessels. Skull and upper cervical spine: No focal marrow lesion. Sinuses/Orbits: Visualized orbits demonstrate no acute abnormality. Paranasal sinus disease. Most notably there is near complete T2 hyperintense opacification of the right frontal sinus. Trace fluid within left mastoid air cells. IMPRESSION: 1. Intermittently motion degraded examination. 2. No evidence of acute intracranial abnormality, including acute infarction. 3. Remote, small chronic cortically based left parietooccipital infarct. 4. Moderate chronic small vessel ischemic disease. Chronic right basal ganglia lacunar infarcts. 5. Mild generalized parenchymal atrophy. 6. Paranasal sinus disease, most notably of the right frontal sinus. 7. Trace left mastoid effusion. Electronically Signed   By: Kellie Simmering DO   On: 05/31/2019 10:50   US Carotid Bilateral (at [facility] and [facility] only)  Result Date: 05/31/2019 CLINICAL DATA:  Altered mental status EXAM: BILATERAL CAROTID DUPLEX ULTRASOUND TECHNIQUE: Pearline Cables scale imaging, color Doppler and duplex ultrasound were performed of bilateral carotid and vertebral arteries in the neck. COMPARISON:  Prior duplex carotid ultrasound 12/23/2014; CT angiogram of the neck FINDINGS: Criteria: Quantification of carotid stenosis is based on velocity parameters that correlate the residual internal carotid diameter with NASCET-based stenosis levels, using the diameter of the distal internal carotid lumen as the  denominator for stenosis measurement. The following velocity measurements were obtained: RIGHT ICA: 61/23 cm/sec CCA: 98/33 cm/sec SYSTOLIC ICA/CCA RATIO:  1.1 ECA:  57 cm/sec LEFT  ICA: 316/69 cm/sec CCA: 23/76 cm/sec SYSTOLIC ICA/CCA RATIO:  6.4 ECA:  190 cm/sec RIGHT CAROTID ARTERY: Minimal heterogeneous atherosclerotic plaque in the proximal internal carotid artery. By peak systolic velocity criteria, the estimated stenosis remains less than 50%. RIGHT VERTEBRAL ARTERY:  Patent with normal antegrade flow. LEFT CAROTID ARTERY: Bulky heterogeneous atherosclerotic plaque in the proximal internal carotid artery. There is significant elevation of the peak systolic velocity consistent with a greater than 70% diameter stenosis. There is associated spectral broadening, aliasing and high velocity jetting. LEFT VERTEBRAL ARTERY:  Patent with normal antegrade flow. IMPRESSION: 1. Severe (70-99%) stenosis proximal left internal carotid artery secondary to bulky heterogeneous and partially calcified atherosclerotic plaque. 2. Mild (1-49%) stenosis proximal right internal carotid artery secondary to heterogenous atherosclerotic plaque. 3. Vertebral arteries are patent with normal antegrade flow. Signed, Criselda Peaches, MD, [place] [facility] Radiology Electronically Signed   By: Jacqulynn Cadet M.D.   On: 05/31/2019 14:43   DG Chest Port 1 View  Result Date: 05/31/2019 CLINICAL DATA:  Aspiration. Patient was not breathing. CPR done by family. EXAM: PORTABLE CHEST 1 VIEW COMPARISON:  04/21/2018 and CT of the neck on 01/27/2017 FINDINGS: The heart is enlarged. Superior mediastinal with significant displacement of the trachea to the RIGHT, consistent with multinodular goiter as seen on previous CT exam. There is atherosclerotic calcification of the thoracic aorta. Lungs are clear. No pulmonary edema. Degenerative changes in the shoulders. IMPRESSION: Cardiomegaly.  No  edema. Superior mediastinal mass consistent with goiter. Significant deviation of the trachea to the RIGHT. Electronically Signed   By: Nolon Nations M.D.   On: 05/31/2019 09:45   Assessment/Plan The patient is a 86 year old female with multiple medical issues including history of CVA and known carotid disease who presented to the [facility] emergency department via EMS with TIA-like symptoms.  1.  Carotid stenosis:  Patient with known history of carotid stenosis.  Ultrasound performed today notable for: Carotid Ultrasound (06/01/19): 1. Severe (70-99%) stenosis proximal left internal carotid artery secondary to bulky heterogeneous and partially calcified atherosclerotic plaque. 2. Mild (1-49%) stenosis proximal right internal carotid artery secondary to heterogenous atherosclerotic plaque. 3. Vertebral arteries are patent with normal antegrade flow.  CTA of the head and neck not performed due to wishes of daughter who is power of attorney.  Met with daughter in hallway and spoke with her regarding open endarterectomy versus stent if repair is something she wanted to move forward with.  Due to the patient's advanced age and multiple medical issues did not recommend moving forward with a carotid endarterectomy.  We discussed carotid stenting in detail including the procedure, risks and benefits.  At this time, the daughter does not want to move forward with any aggressive measures.  I think this is reasonable given her mom's advanced age, dementia and multiple medical issues.  At this time, the daughter would just like to keep her mother comfortable.  We will not intervene as per the daughter's wishes.  Vascular will sign off at this time.  The daughter understands that if she changes her mind we are happy to intervene.  2.  Chronic kidney disease stage III: Recommend strict blood pressure and blood sugar control as this directly affects kidney function. This is followed by  the patient's primary care physician  3.  Peripheral vascular disease: At this time, the patient is asymptomatic with essentially unremarkable physical exam. Patient is currently on Eliquis, aspirin and statin for medical management.  There is no indication for vascular intervention at this time.  Discussed with Dr. Mayme Genta, PA-C  06/01/2019 11:38 AM  This note was created with Dragon medical transcription system.  Any error is purely unintentional

## 2019-06-01 NOTE — Evaluation (Signed)
Clinical/Bedside Swallow Evaluation Patient Details  Name: Katelyn [name] MRN: [id number] Date of Birth: 02/10/1924  Today's Date: 06/01/2019 Time: SLP Start Time (ACUTE ONLY): 1120 SLP Stop Time (ACUTE ONLY): 1210 SLP Time Calculation (min) (ACUTE ONLY): 50 min  Past Medical History:  Past Medical History:  Diagnosis Date  . Abdominal pain   . Anxiety   . Atherosclerosis   . Chronic kidney disease    stage 3  . Colon polyps   . Constipation   . Cough with expectoration   . Dependent edema bilateral legs  . Gait abnormality   . Gout   . Hearing loss   . Hearing loss   . Hemorrhoids   . Hypertension   . Incontinence   . Osteoarthritis   . Osteoarthritis   . Osteoarthritis   . Paget's disease of bone   . Peripheral vascular disease ([place])   . Pneumonia   . Proteinuria   . Stroke [facility])    Past Surgical History:  Past Surgical History:  Procedure Laterality Date  . PARTIAL HYSTERECTOMY    . PERIPHERAL VASCULAR CATHETERIZATION N/A 01/13/2015   Procedure: Abdominal Aortogram w/Lower Extremity;  Surgeon: Algernon Huxley, MD;  Location: [place] [facility];  Service: Cardiovascular;  Laterality: N/A;  . PERIPHERAL VASCULAR CATHETERIZATION  01/13/2015   Procedure: Lower Extremity Intervention;  Surgeon: Algernon Huxley, MD;  Location: [place] [facility];  Service: Cardiovascular;;   HPI: Per admitting history and Physical:   "Katelyn [name] is a 86 y.o. female with medical history significant for hypertension, chronic kidney disease stage 3 , atrial fibrillation on chronic anticoagulation therapy, peripheral arterial disease and history of CVA who was brought into the hospital by EMS for evaluation of mental status changes.  Most of the history was obtained from patient's daughter over the phone.  Per patient's daughter her mother was in her usual state of health and had gone to bed last night, she was unsure of the time.  Her sister who was staying at the house with her  mother had gone to wake her mother up in the morning but noted she was not responding.  She called her other sister who lives close by and she called EMS.  EMS notes that the patient had garbled speech and a left-sided facial droop when they arrived so she was brought to the ER for further evaluation. She had a CT  scan of the head without contrast which showed no intracranial hemorrhage but showed chronic white matter changes and left parietal temporal infarct. No acute ischemia or infarct identified."  Assessment / Plan / Recommendation Clinical Impression  This pleasant 87 YO female was admitted with concerns of CVA presenting with "garbled speech" and facial droop per ED notes. Nsg reports occasional coughing after thin liquids earlier this morning, therefore Swallow eval initiated with speech screening. Pt was alert and cooperative, daughter present and supportive. Oral mech exam revealed structures to be functioning adequately with no apparent weakness. Pt has natural teeth with several missing. Oral phase of swallow was slow but Pt was eventually able to clear all boluses with minimal residue. Vocal quality remained clear, laryngeal elevation appeared adequate. Bedside swallow eval revealed no s/s of aspiration with any tested consistencies. Pt did present with belching after most liquids which she reports is usual for her. She also reports it sometimes "comes back up". Pt would likely benefit from reflux medication. Discussed reflux precautions, focusing on not eating 2 hrs before laying down.  Pt and daughter report that Pt likes to eat in the middle of the night and does not sit up for those midnight snacks. Speech screened. Pt was able to respond to simple Y/N questions but had some difficulty with more complex questions. Speech was intelligible for 1-2 word responses but often unintelligible for longer responses. She was able to name and ID basic objects but had some difficulty with multistep  directions. Conversational speech was appropriate but often needed to be repeated for intelligibility. Pt is able to communicate needs and wants. Daughter was present for screening and reports speech and cognition are mostly back to baseline and have improved since admission. Rec diet downgrade to dys 3 for ease of chewing. ST to f/u with toleration of diet and determine if Pt has any further ST needs. SLP Visit Diagnosis: Dysphagia, oropharyngeal phase (R13.12)    Aspiration Risk  Mild aspiration risk    Diet Recommendation Dysphagia 3 (Mech soft)   Liquid Administration via: Other (Comment)(single sips only) Medication Administration: Whole meds with liquid Supervision: Staff to assist with self feeding Compensations: Small sips/bites Postural Changes: Seated upright at 90 degrees;Remain upright for at least 30 minutes after po intake    Other  Recommendations Recommended Consults: Other (Comment)(Consider reflux meds)   Follow up Recommendations        Frequency and Duration min 1 x/week  1 week       Prognosis   Good     Swallow Study   General Date of Onset: 05/31/19 Type of Study: Bedside Swallow Evaluation Previous Swallow Assessment: None Diet Prior to this Study: Dysphagia 3 (soft) Respiratory Status: Nasal cannula Behavior/Cognition: Alert;Cooperative;Pleasant mood Oral Cavity Assessment: Within Functional Limits Oral Care Completed by SLP: No Oral Cavity - Dentition: Missing dentition Vision: Functional for self-feeding Self-Feeding Abilities: Needs assist Patient Positioning: Upright in chair Baseline Vocal Quality: Normal;Low vocal intensity    Oral/Motor/Sensory Function Overall Oral Motor/Sensory Function: Within functional limits   Ice Chips Ice chips: Not tested   Thin Liquid Thin Liquid: Within functional limits Presentation: Straw;Cup    Nectar Thick Nectar Thick Liquid: Not tested   Honey Thick     Puree Puree: Within functional  limits Presentation: Spoon   Solid     Solid: Impaired Presentation: Self Fed Oral Phase Functional Implications: Prolonged oral transit      Lucila Maine 06/01/2019,12:45 PM

## 2019-06-01 NOTE — Evaluation (Signed)
Occupational Therapy Evaluation Patient Details Name: Katelyn [name] MRN: [id number] DOB: May 19, 1923 Today's Date: 06/01/2019    History of Present Illness 86 y.o. female with medical history significant for hypertension, chronic kidney disease stage 3 , atrial fibrillation on chronic anticoagulation therapy, peripheral arterial disease and history of CVA who was brought into the hospital by EMS for evaluation of mental status changes.  Morning of admit pt tried to wake her and she was unresponsive and reported L facial droop, and garbled speech. Imaging negative for acute infarct.   Clinical Impression   Pt was seen for OT evaluation this date. Prior to hospital admission, pt was living with her daughter and participating in the [facility] program. Per dtr has not attended the day program with [facility] since pandemic began. Pt uses wheelchair for primary mode of transportation, needing assist for w/c transfers and assist for most ADL tasks. Pt alert and oriented to self, place, and month, but unclear as to why she is here. Slight L lateral lean noted in long sitting in bed. Min A to correct with pillow positioned under LUE to support continued improved posture for self feeding. Set up assist for breakfast items. Pt politely but insistently declined OOB attempts despite education and encouragement from therapist and daughter who entered session at end. Pt currently pt demonstrates impairments as described below (See OT problem list) which functionally limit her ability to perform ADL/self-care tasks. Pt currently requires Min-Mod A for ADL tasks.  Pt would benefit from skilled OT to address noted impairments and functional limitations (see below for any additional details) in order to maximize safety and independence while minimizing falls risk and caregiver burden. Upon hospital discharge, recommend [place] via [facility] program to maximize pt safety and return to functional independence during meaningful  occupations of daily life.     Follow Up Recommendations  Home health OT    Equipment Recommendations  None recommended by OT    Recommendations for Other Services       Precautions / Restrictions Precautions Precautions: Fall Restrictions Weight Bearing Restrictions: No      Mobility Bed Mobility               General bed mobility comments: Pt required Min A to adjust to more upright positioning in bed for improved access to breakfast tray after pt initially received with slight L lateral  lean; pt declined OOB attempts despite education and encouragement from therapist and dtr  Transfers                      Balance Overall balance assessment: Needs assistance     Sitting balance - Comments: slight L lateral lean in long sitting in bed, repositioned and pillow utilized to support improved positioning                                   ADL either performed or assessed with clinical judgement   ADL Overall ADL's : Needs assistance/impaired                                       General ADL Comments: Pt requires at least Min A to Mod A for bathing, dressing, and set up for self feeding, Min A for SPT to [facility]     Vision Baseline Vision/History: Wears glasses Wears  Glasses: Reading only Patient Visual Report: No change from baseline Vision Assessment?: No apparent visual deficits     Perception     Praxis      Pertinent Vitals/Pain Pain Assessment: No/denies pain     Hand Dominance     Extremity/Trunk Assessment Upper Extremity Assessment Upper Extremity Assessment: Generalized weakness   Lower Extremity Assessment Lower Extremity Assessment: Generalized weakness(age appropriate deficits, appears to be = bilaterally)       Communication Communication Communication: Other (comment);HOH(Pt with very garbled speech family reports now near baseline)   Cognition Arousal/Alertness: Awake/alert Behavior During  Therapy: WFL for tasks assessed/performed Overall Cognitive Status: History of cognitive impairments - at baseline                                 General Comments: Pt disoriented to date/year (knew month) and situation   General Comments       Exercises     Shoulder Instructions      Home Living Family/patient expects to be discharged to:: Private residence Living Arrangements: Children(lives with daughter) Available Help at Discharge: Family Type of Home: House Home Access: Ramped entrance     Home Layout: One level     Bathroom Shower/Tub: (pt reports walk-in shower, prior documentation indicates tub/shower)         Home Equipment: Walker - 2 wheels;Wheelchair - manual;Hospital bed;Bedside commode;Shower seat          Prior Functioning/Environment Level of Independence: Needs assistance  Gait / Transfers Assistance Needed: apparently pt has not walked in ~1 year, needs assist transferring to w/c, BSC; pt reports using BUE and BLE to self propel w/c ADL's / Homemaking Assistance Needed: daughter assist with most aspects of care, meals, meds, ADL, etc Communication / Swallowing Assistance Needed: dtr confirms pt's speech is near her baseline          OT Problem List: Decreased strength;Decreased activity tolerance;Decreased safety awareness;Impaired balance (sitting and/or standing);Decreased cognition      OT Treatment/Interventions: Self-care/ADL training;Therapeutic exercise;Therapeutic activities;Patient/family education;Balance training;DME and/or AE instruction;Cognitive remediation/compensation    OT Goals(Current goals can be found in the care plan section) Acute Rehab OT Goals Patient Stated Goal: go home OT Goal Formulation: With patient/family Time For Goal Achievement: 06/15/19 Potential to Achieve Goals: Good ADL Goals Pt Will Perform Grooming: with set-up;with supervision;sitting Pt Will Perform Upper Body Dressing: with min  assist;sitting Pt Will Transfer to Toilet: with min guard assist;stand pivot transfer;bedside commode  OT Frequency: Min 1X/week   Barriers to D/C:            Co-evaluation              AM-PAC OT "6 Clicks" Daily Activity     Outcome Measure Help from another person eating meals?: None Help from another person taking care of personal grooming?: A Little Help from another person toileting, which includes using toliet, bedpan, or urinal?: A Little Help from another person bathing (including washing, rinsing, drying)?: A Lot Help from another person to put on and taking off regular upper body clothing?: A Little Help from another person to put on and taking off regular lower body clothing?: A Lot 6 Click Score: 17   End of Session    Activity Tolerance: Patient tolerated treatment well Patient left: in bed;with call bell/phone within reach;with bed alarm set;with family/visitor present  OT Visit Diagnosis: Other abnormalities of gait and mobility (R26.89);Repeated falls (R29.6);Muscle  weakness (generalized) (M62.81)                Time: 3545-6256 OT Time Calculation (min): 22 min Charges:  OT General Charges $OT Visit: 1 Visit OT Evaluation $OT Eval Moderate Complexity: 1 Mod  Jeni Salles, MPH, MS, OTR/L ascom [phone] 06/01/19, 10:11 AM

## 2019-06-02 LAB — CBC
HCT: 34.9 % — ABNORMAL LOW (ref 36.0–46.0)
Hemoglobin: 11 g/dL — ABNORMAL LOW (ref 12.0–15.0)
MCH: 31.9 pg (ref 26.0–34.0)
MCHC: 31.5 g/dL (ref 30.0–36.0)
MCV: 101.2 fL — ABNORMAL HIGH (ref 80.0–100.0)
Platelets: 203 10*3/uL (ref 150–400)
RBC: 3.45 MIL/uL — ABNORMAL LOW (ref 3.87–5.11)
RDW: 14.5 % (ref 11.5–15.5)
WBC: 6.5 10*3/uL (ref 4.0–10.5)
nRBC: 0 % (ref 0.0–0.2)

## 2019-06-02 LAB — ECHOCARDIOGRAM COMPLETE
Height: 63 in
Weight: 2260.8 oz

## 2019-06-02 LAB — LACTIC ACID, PLASMA: Lactic Acid, Venous: 1.7 mmol/L (ref 0.5–1.9)

## 2019-06-02 MED ORDER — AMLODIPINE BESYLATE 10 MG PO TABS: 10.0000 mg | ORAL_TABLET | Freq: Every day | ORAL | 0 refills | Status: AC

## 2019-06-02 MED ORDER — FAMOTIDINE 20 MG PO TABS: 20.0000 mg | ORAL_TABLET | Freq: Every day | ORAL | 0 refills | Status: AC

## 2019-06-02 MED ORDER — ASPIRIN 325 MG PO TBEC: 325.0000 mg | DELAYED_RELEASE_TABLET | Freq: Every day | ORAL | 0 refills | Status: AC

## 2019-06-02 NOTE — Plan of Care (Signed)
Pt to discharge to home today.  

## 2019-06-02 NOTE — Discharge Summary (Signed)
Katelyn [name] CVE:[id number] DOB: [birth year] DOA: 05/31/2019  PCP: Abelardo Diesel, NP (Inactive)  Admit date: 05/31/2019 Discharge date: 06/02/2019  Admitted From: home Disposition:  home  Recommendations for Outpatient Follow-up:  1. Follow up with PCP in 1 week 2. Please obtain BMP/CBC in one week 3. Follow up with Dr. Manuella Ghazi neurology in one week     Discharge Condition:Stable CODE STATUS:DNR Diet recommendation: WCHENIDPO2  Brief/Interim Summary: Katelyn [name] is a 86 y.o. female with medical history significant for hypertension, chronic kidney disease stage 3 , atrial fibrillation on chronic anticoagulation therapy, peripheral arterial disease and history of CVA who was brought into the hospital by EMS for evaluation of mental status changes. EMS notes that the patient had garbled speech and a left-sided facial droop when they arrived so she was brought to the ER for further evaluation. She had a CT  scan of the head without contrast which showed no intracranial hemorrhage but showed chronic white matter changes and left parietal temporal infarct.No acute ischemia or infarct identified. Patient speech is still garbled and she will be admitted to the hospital for further evaluation. Neurology was consulted, recommended EEG, which needs to be done as outpt, continue on Eliquis. MRI did not show acute changes. Carotid US was obtained revealing LICA with severe stenosis. Vascular surgery was consulted, but family opted not to proceed with intervention , including stenting. Pt had PT/OT. Speech therapy placed pt on dysphagia 3 diet. Today speech is more clear and she is more interactive. Eating breakfast and has no complaints. Blood pressure medications were adjusted and she will need to follow up with pcp for further management.  Discharge Diagnoses:  Principal Problem:   CVA (cerebral vascular accident) [facility]) Active Problems:   Essential (primary) hypertension   CKD (chronic  kidney disease) stage 3, GFR 30-59 ml/min   Leukocytosis   Atrial fibrillation with RVR ([place])   Acute CVA (cerebrovascular accident) [facility])   Hyperthyroidism    Discharge Instructions  Discharge Instructions    Call MD for:  temperature >100.4   Complete by: As directed    Diet - low sodium heart healthy   Complete by: As directed    Dysphagia 3   Discharge instructions   Complete by: As directed    Follow up with pcp in one week Dysphagia 3 diet   Increase activity slowly   Complete by: As directed      Allergies as of 06/02/2019      Reactions   Bee Venom Anaphylaxis      Medication List    TAKE these medications   acetaminophen 325 MG tablet Commonly known as: TYLENOL Take 2 tablets (650 mg total) by mouth every 6 (six) hours as needed for mild pain (or Fever >/= 101).   alum & mag hydroxide-simeth [phone] MG/5ML suspension Commonly known as: MAALOX PLUS Take 5 mLs by mouth every 4 (four) hours as needed for indigestion.   amLODipine 10 MG tablet Commonly known as: NORVASC Take 1 tablet (10 mg total) by mouth daily. Start taking on: June 03, 2019 What changed:   medication strength  how much to take   apixaban 2.5 MG Tabs tablet Commonly known as: ELIQUIS Take 1 tablet (2.5 mg total) by mouth 2 (two) times daily.   aspirin 325 MG EC tablet Take 1 tablet (325 mg total) by mouth daily. Start taking on: June 03, 2019   atorvastatin 40 MG tablet Commonly known as: LIPITOR Take 1 tablet (40 mg total)  by mouth daily at 6 PM.   atropine 1 % ophthalmic solution Place 2 drops under the tongue every hour as needed (excessive secretions).   donepezil 10 MG tablet Commonly known as: ARICEPT Take 1 tablet (10 mg total) by mouth daily.   famotidine 20 MG tablet Commonly known as: PEPCID Take 1 tablet (20 mg total) by mouth daily. Start taking on: June 03, 2019   fluticasone 50 MCG/ACT nasal spray Commonly known as: FLONASE Place 2 sprays into both  nostrils daily.   guaiFENesin-dextromethorphan 100-10 MG/5ML syrup Commonly known as: ROBITUSSIN DM Take 5 mLs by mouth every 6 (six) hours as needed for cough.   hydrocortisone 2.5 % rectal cream Commonly known as: ANUSOL-HC Place 1 application rectally 2 (two) times daily as needed for hemorrhoids.   LORazepam 0.5 MG tablet Commonly known as: ATIVAN Take 0.5 mg by mouth 3 (three) times daily.   losartan 50 MG tablet Commonly known as: COZAAR Take 50 mg by mouth daily.   methimazole 5 MG tablet Commonly known as: TAPAZOLE Take 2.5 mg by mouth 2 (two) times daily.   metoprolol succinate 25 MG 24 hr tablet Commonly known as: TOPROL-XL Take 25 mg by mouth daily.   ondansetron 4 MG disintegrating tablet Commonly known as: ZOFRAN-ODT Take 4 mg by mouth every 4 (four) hours as needed for nausea or vomiting.   polyethylene glycol 17 g packet Commonly known as: MIRALAX / GLYCOLAX Take 17 g by mouth daily.   sennosides-docusate sodium 8.6-50 MG tablet Commonly known as: SENOKOT-S Take 1 tablet by mouth 2 (two) times daily as needed for constipation.   vitamin A & D ointment Apply 1 application topically 2 (two) times daily as needed for dry skin (itchy skin).   Zinc Oxide 13 % Crea Place 1 application rectally 4 (four) times daily as needed (painful hemorrhoids).       Allergies  Allergen Reactions  . Bee Venom Anaphylaxis    Consultations:     Procedures/Studies: CT HEAD WO CONTRAST  Result Date: 05/31/2019 CLINICAL DATA:  Neuro deficit. Acute stroke suspected. Acute mental status change and weakness. EXAM: CT HEAD WITHOUT CONTRAST TECHNIQUE: Contiguous axial images were obtained from the base of the skull through the vertex without intravenous contrast. COMPARISON:  December 20, 2017 FINDINGS: Brain: No subdural, epidural, or subarachnoid hemorrhage. Ventricles and sulci are stable. White matter changes are centrally stable. A lacunar infarct in the right basal  ganglia is unchanged. An infarct in the left parietal temporal region is unchanged. No acute cortical ischemia or infarct identified. Cerebellum, brainstem, and basal cisterns are normal. No mass effect or midline shift. Vascular: Calcified atherosclerosis is seen in the intracranial carotids. Skull: Normal. Negative for fracture or focal lesion. Sinuses/Orbits: Chronic opacification of the right frontal sinus with surrounding bony sclerosis is stable. No other sinus disease identified. Other: None. IMPRESSION: 1. Chronic white matter changes and left parietal temporal infarct. No acute ischemia or infarct identified. 2. Chronic opacification the right frontal sinus with surrounding bony sclerosis, unchanged. Electronically Signed   By: Dorise Bullion III M.D   On: 05/31/2019 05:32   MR BRAIN WO CONTRAST  Result Date: 05/31/2019 CLINICAL DATA:  Ataxia, stroke suspected. EXAM: MRI HEAD WITHOUT CONTRAST TECHNIQUE: Multiplanar, multiecho pulse sequences of the brain and surrounding structures were obtained without intravenous contrast. COMPARISON:  Head CT performed earlier the same day 05/31/2019, brain MRI 01/28/2017 FINDINGS: Brain: The examination is intermittently motion degraded. Most notably, there is moderate motion degradation of  the axial T1 weighted sequence. There is no evidence of acute infarct. No evidence of intracranial mass. No midline shift or extra-axial fluid collection. No chronic intracranial blood products. Redemonstrated small cortically based left parietooccipital infarct. Moderate patchy and confluent T2/FLAIR hyperintensity within the cerebral white matter is nonspecific, but consistent with chronic small vessel ischemic disease. Redemonstrated chronic right basal ganglia lacunar infarcts. Stable, mild generalized parenchymal atrophy. Vascular: Flow voids maintained within the proximal large arterial vessels. Skull and upper cervical spine: No focal marrow lesion. Sinuses/Orbits:  Visualized orbits demonstrate no acute abnormality. Paranasal sinus disease. Most notably there is near complete T2 hyperintense opacification of the right frontal sinus. Trace fluid within left mastoid air cells. IMPRESSION: 1. Intermittently motion degraded examination. 2. No evidence of acute intracranial abnormality, including acute infarction. 3. Remote, small chronic cortically based left parietooccipital infarct. 4. Moderate chronic small vessel ischemic disease. Chronic right basal ganglia lacunar infarcts. 5. Mild generalized parenchymal atrophy. 6. Paranasal sinus disease, most notably of the right frontal sinus. 7. Trace left mastoid effusion. Electronically Signed   By: Kellie Simmering DO   On: 05/31/2019 10:50   US Carotid Bilateral (at [facility] and [facility] only)  Result Date: 05/31/2019 CLINICAL DATA:  Altered mental status EXAM: BILATERAL CAROTID DUPLEX ULTRASOUND TECHNIQUE: Pearline Cables scale imaging, color Doppler and duplex ultrasound were performed of bilateral carotid and vertebral arteries in the neck. COMPARISON:  Prior duplex carotid ultrasound 12/23/2014; CT angiogram of the neck FINDINGS: Criteria: Quantification of carotid stenosis is based on velocity parameters that correlate the residual internal carotid diameter with NASCET-based stenosis levels, using the diameter of the distal internal carotid lumen as the denominator for stenosis measurement. The following velocity measurements were obtained: RIGHT ICA: 61/23 cm/sec CCA: 12/87 cm/sec SYSTOLIC ICA/CCA RATIO:  1.1 ECA:  57 cm/sec LEFT ICA: 316/69 cm/sec CCA: 86/76 cm/sec SYSTOLIC ICA/CCA RATIO:  6.4 ECA:  190 cm/sec RIGHT CAROTID ARTERY: Minimal heterogeneous atherosclerotic plaque in the proximal internal carotid artery. By peak systolic velocity criteria, the estimated stenosis remains less than 50%. RIGHT VERTEBRAL ARTERY:  Patent with normal antegrade flow. LEFT CAROTID ARTERY: Bulky heterogeneous atherosclerotic plaque in the proximal internal  carotid artery. There is significant elevation of the peak systolic velocity consistent with a greater than 70% diameter stenosis. There is associated spectral broadening, aliasing and high velocity jetting. LEFT VERTEBRAL ARTERY:  Patent with normal antegrade flow. IMPRESSION: 1. Severe (70-99%) stenosis proximal left internal carotid artery secondary to bulky heterogeneous and partially calcified atherosclerotic plaque. 2. Mild (1-49%) stenosis proximal right internal carotid artery secondary to heterogenous atherosclerotic plaque. 3. Vertebral arteries are patent with normal antegrade flow. Signed, Criselda Peaches, MD, [facility] Electronically Signed   By: Jacqulynn Cadet M.D.   On: 05/31/2019 14:43   DG Chest Port 1 View  Result Date: 05/31/2019 CLINICAL DATA:  Aspiration. Patient was not breathing. CPR done by family. EXAM: PORTABLE CHEST 1 VIEW COMPARISON:  04/21/2018 and CT of the neck on 01/27/2017 FINDINGS: The heart is enlarged. Superior mediastinal with significant displacement of the trachea to the RIGHT, consistent with multinodular goiter as seen on previous CT exam. There is atherosclerotic calcification of the thoracic aorta. Lungs are clear. No pulmonary edema. Degenerative changes in the shoulders. IMPRESSION: Cardiomegaly.  No edema. Superior mediastinal mass consistent with goiter. Significant deviation of the trachea to the RIGHT. Electronically Signed   By: Nolon Nations M.D.   On: 05/31/2019 09:45   ECHOCARDIOGRAM COMPLETE  Result  Date: 06/02/2019    ECHOCARDIOGRAM REPORT   Patient Name:   Katelyn [name] [facility] Date of Exam: 06/01/2019 Medical Rec #:  [id number]              Height:       63.0 in Accession #:    [id number]             Weight:       141.3 lb Date of Birth:  1923-07-10              BSA:          1.668 m Patient Age:    86 years               BP:           185/76 mmHg Patient Gender: F                       HR:           67 bpm. Exam Location:  [facility] Procedure: 2D Echo, Color Doppler and Cardiac Doppler Indications:     I163.9 Stroke  History:         Patient has prior history of Echocardiogram examinations.                  Stroke and PVD, CKD; Risk Factors:Hypertension.  Sonographer:     Charmayne Sheer RDCS (AE) Referring Phys:  Reading Diagnosing Phys: Neoma Laming MD IMPRESSIONS  1. Left ventricular ejection fraction, by estimation, is 40 to 45%. The left ventricle has mildly decreased function. The left ventricle demonstrates global hypokinesis. The left ventricular internal cavity size was mildly dilated. There is moderate concentric left ventricular hypertrophy. Left ventricular diastolic parameters are consistent with Grade I diastolic dysfunction (impaired relaxation).  2. Right ventricular systolic function is mildly reduced. The right ventricular size is mildly enlarged. mildly increased right ventricular wall thickness.  3. Left atrial size was moderately dilated.  4. Right atrial size was moderately dilated.  5. Moderate pericardial effusion. The pericardial effusion is posterior to the left ventricle.  6. The mitral valve is degenerative. Mild mitral valve regurgitation. Mild mitral stenosis.  7. The aortic valve is grossly normal. Aortic valve regurgitation is not visualized. Mild aortic valve sclerosis is present, with no evidence of aortic valve stenosis. FINDINGS  Left Ventricle: Left ventricular ejection fraction, by estimation, is 40 to 45%. The left ventricle has mildly decreased function. The left ventricle demonstrates global hypokinesis. The left ventricular internal cavity size was mildly dilated. There is  moderate concentric left ventricular hypertrophy. Left ventricular diastolic parameters are consistent with Grade I diastolic dysfunction (impaired relaxation). Right Ventricle: The right ventricular size is mildly enlarged. Mildly increased right ventricular wall thickness.  Right ventricular systolic function is mildly reduced. Left Atrium: Left atrial size was moderately dilated. Right Atrium: Right atrial size was moderately dilated. Pericardium: A moderately sized pericardial effusion is present. The pericardial effusion is posterior to the left ventricle. Mitral Valve: The mitral valve is degenerative in appearance. Mild mitral valve regurgitation. Mild mitral valve stenosis. MV peak gradient, 4.2 mmHg. The mean mitral valve gradient is 1.0 mmHg. Tricuspid Valve: The tricuspid valve is grossly normal. Tricuspid valve regurgitation is mild. Aortic Valve: The aortic valve is grossly normal. Aortic valve regurgitation is not visualized. Mild aortic valve sclerosis is present, with no evidence of aortic valve stenosis. Aortic valve mean gradient measures 9.0 mmHg. Aortic valve peak gradient measures  15.2 mmHg. Aortic valve area, by VTI measures 1.71 cm. Pulmonic Valve: The pulmonic valve was grossly normal. Pulmonic valve regurgitation is trivial. Aorta: The aortic root, ascending aorta and aortic arch are all structurally normal, with no evidence of dilitation or obstruction. IAS/Shunts: No atrial level shunt detected by color flow Doppler.  LEFT VENTRICLE PLAX 2D LVIDd:         4.67 cm  Diastology LVIDs:         3.77 cm  LV e' lateral:   4.46 cm/s LV PW:         0.95 cm  LV E/e' lateral: 10.2 LV IVS:        0.88 cm  LV e' medial:    3.15 cm/s LVOT diam:     2.30 cm  LV E/e' medial:  14.5 LV SV:         72 LV SV Index:   43 LVOT Area:     4.15 cm  RIGHT VENTRICLE RV Basal diam:  2.91 cm LEFT ATRIUM             Index       RIGHT ATRIUM           Index LA diam:        3.30 cm 1.98 cm/m  RA Area:     12.10 cm LA Vol (A2C):   61.3 ml 36.75 ml/m RA Volume:   25.30 ml  15.17 ml/m LA Vol (A4C):   36.5 ml 21.88 ml/m LA Biplane Vol: 49.4 ml 29.61 ml/m  AORTIC VALVE                    PULMONIC VALVE AV Area (Vmax):    1.86 cm     PV Vmax:       1.16 m/s AV Area (Vmean):   1.74 cm      PV Vmean:      77.100 cm/s AV Area (VTI):     1.71 cm     PV VTI:        0.234 m AV Vmax:           195.00 cm/s  PV Peak grad:  5.4 mmHg AV Vmean:          147.000 cm/s PV Mean grad:  3.0 mmHg AV VTI:            0.422 m AV Peak Grad:      15.2 mmHg AV Mean Grad:      9.0 mmHg LVOT Vmax:         87.20 cm/s LVOT Vmean:        61.400 cm/s LVOT VTI:          0.174 m LVOT/AV VTI ratio: 0.41  AORTA Ao Root diam: 3.50 cm MITRAL VALVE MV Area (PHT): 2.99 cm    SHUNTS MV Peak grad:  4.2 mmHg    Systemic VTI:  0.17 m MV Mean grad:  1.0 mmHg    Systemic Diam: 2.30 cm MV Vmax:       1.03 m/s MV Vmean:      46.0 cm/s MV Decel Time: 254 msec MV E velocity: 45.60 cm/s MV A velocity: 88.80 cm/s MV E/A ratio:  0.51 Neoma Laming MD Electronically signed by Neoma Laming MD Signature Date/Time: 06/02/2019/10:02:09 AM    Final        Subject No complaints  Discharge Exam: Vitals:   06/01/19 2359 06/02/19 0856  BP: (!) 141/61 (!) 172/72  Pulse:  82 (!) 105  Resp: 20 18  Temp: 97.6 F (36.4 C) 97.7 F (36.5 C)  SpO2: 100% (!) 87%   Vitals:   06/01/19 0859 06/01/19 1651 06/01/19 2359 06/02/19 0856  BP: (!) 185/76 (!) 160/70 (!) 141/61 (!) 172/72  Pulse: 63 77 82 (!) 105  Resp: 18 16 20 18   Temp: 98.6 F (37 C) (!) 97.3 F (36.3 C) 97.6 F (36.4 C) 97.7 F (36.5 C)  TempSrc:  Oral Oral Oral  SpO2: 100% 100% 100% (!) 87%  Weight:      Height:        General: Pt is alert, awake, not in acute distress, eating breakfast Cardiovascular: RRR, S1/S2 +, no rubs, no gallops Respiratory: CTA bilaterally, no wheezing, no rhonchi Abdominal: Soft, NT, ND, bowel sounds + Extremities: no edema, no cyanosis    The results of significant diagnostics from this hospitalization (including imaging, microbiology, ancillary and laboratory) are listed below for reference.     Microbiology: Recent Results (from the past 240 hour(s))  Urine Culture     Status: None   Collection Time: 05/31/19  6:55 AM   Specimen:  Urine, Catheterized  Result Value Ref Range Status   Specimen Description   Final    URINE, CATHETERIZED Performed at [facility], [address] [place], [place] [address]    Special Requests   Final    Normal Performed at [facility], [address], [place], [place] [address]    Culture   Final    NO GROWTH Performed at [facility], [place] [address], [place], [place] [address]    Report Status 06/01/2019 FINAL  Final  SARS CORONAVIRUS 2 (TAT 6-24 HRS) Nasopharyngeal Nasopharyngeal Swab     Status: None   Collection Time: 05/31/19  7:02 AM   Specimen: Nasopharyngeal Swab  Result Value Ref Range Status   SARS Coronavirus 2 NEGATIVE NEGATIVE Final    Comment: (NOTE) SARS-CoV-2 target nucleic acids are NOT DETECTED. The SARS-CoV-2 RNA is generally detectable in upper and lower respiratory specimens during the acute phase of infection. Negative results do not preclude SARS-CoV-2 infection, do not rule out co-infections with other pathogens, and should not be used as the sole basis for treatment or other patient management decisions. Negative results must be combined with clinical observations, patient history, and epidemiological information. The expected result is Negative. Fact Sheet for Patients: SugarRoll.be Fact Sheet for Healthcare Providers: https://www.woods-mathews.com/ This test is not yet approved or cleared by the [place] FDA and  has been authorized for detection and/or diagnosis of SARS-CoV-2 by FDA under an Emergency Use Authorization (EUA). This EUA will remain  in effect (meaning this test can be used) for the duration of the COVID-19 declaration under Section 56 4(b)(1) of the Act, 21 U.S.C. section 360bbb-3(b)(1), unless the authorization is terminated or revoked sooner. Performed at [facility], [place] [address] [place], [place] [address]      Labs: BNP (last 3 results) No  results for input(s): BNP in the last 8760 hours. Basic Metabolic Panel: Recent Labs  Lab 05/31/19 0452  NA 140  K 3.8  CL 106  CO2 24  GLUCOSE 176*  BUN 27*  CREATININE 1.25*  CALCIUM 9.1   Liver Function Tests: Recent Labs  Lab 05/31/19 0452  AST 21  ALT 8  ALKPHOS 57  BILITOT 0.6  PROT 7.2  ALBUMIN 3.9   No results for input(s): LIPASE, AMYLASE in the last 168  hours. No results for input(s): AMMONIA in the last 168 hours. CBC: Recent Labs  Lab 05/31/19 0452 06/02/19 0756  WBC 16.4* 6.5  NEUTROABS 11.8*  --   HGB 11.5* 11.0*  HCT 35.8* 34.9*  MCV 100.0 101.2*  PLT 222 203   Cardiac Enzymes: No results for input(s): CKTOTAL, CKMB, CKMBINDEX, TROPONINI in the last 168 hours. BNP: Invalid input(s): POCBNP CBG: No results for input(s): GLUCAP in the last 168 hours. D-Dimer No results for input(s): DDIMER in the last 72 hours. Hgb A1c Recent Labs    06/01/19 0534  HGBA1C 5.7*   Lipid Profile Recent Labs    06/01/19 0534  CHOL 148  HDL 50  LDLCALC 89  TRIG 47  CHOLHDL 3.0   Thyroid function studies No results for input(s): TSH, T4TOTAL, T3FREE, THYROIDAB in the last 72 hours.  Invalid input(s): FREET3 Anemia work up No results for input(s): VITAMINB12, FOLATE, FERRITIN, TIBC, IRON, RETICCTPCT in the last 72 hours. Urinalysis    Component Value Date/Time   COLORURINE YELLOW (A) 05/31/2019 0655   APPEARANCEUR CLEAR (A) 05/31/2019 0655   APPEARANCEUR Clear 06/06/2014 1015   LABSPEC 1.017 05/31/2019 0655   LABSPEC 1.017 06/06/2014 1015   PHURINE 6.0 05/31/2019 0655   GLUCOSEU NEGATIVE 05/31/2019 0655   GLUCOSEU Negative 06/06/2014 1015   HGBUR LARGE (A) 05/31/2019 0655   BILIRUBINUR NEGATIVE 05/31/2019 0655   BILIRUBINUR neg 10/20/2014 1001   BILIRUBINUR Negative 06/06/2014 1015   KETONESUR NEGATIVE 05/31/2019 0655   PROTEINUR 30 (A) 05/31/2019 0655   UROBILINOGEN 0.2 10/20/2014 1001   NITRITE NEGATIVE 05/31/2019 0655   LEUKOCYTESUR  NEGATIVE 05/31/2019 0655   LEUKOCYTESUR Negative 06/06/2014 1015   Sepsis Labs Invalid input(s): PROCALCITONIN,  WBC,  LACTICIDVEN Microbiology Recent Results (from the past 240 hour(s))  Urine Culture     Status: None   Collection Time: 05/31/19  6:55 AM   Specimen: Urine, Catheterized  Result Value Ref Range Status   Specimen Description   Final    URINE, CATHETERIZED Performed at [facility], [address], [place], [place] [address]    Special Requests   Final    Normal Performed at [facility], [address] [place], [place] [address]    Culture   Final    NO GROWTH Performed at [facility], [place] [address], [place], [place] [address]    Report Status 06/01/2019 FINAL  Final  SARS CORONAVIRUS 2 (TAT 6-24 HRS) Nasopharyngeal Nasopharyngeal Swab     Status: None   Collection Time: 05/31/19  7:02 AM   Specimen: Nasopharyngeal Swab  Result Value Ref Range Status   SARS Coronavirus 2 NEGATIVE NEGATIVE Final    Comment: (NOTE) SARS-CoV-2 target nucleic acids are NOT DETECTED. The SARS-CoV-2 RNA is generally detectable in upper and lower respiratory specimens during the acute phase of infection. Negative results do not preclude SARS-CoV-2 infection, do not rule out co-infections with other pathogens, and should not be used as the sole basis for treatment or other patient management decisions. Negative results must be combined with clinical observations, patient history, and epidemiological information. The expected result is Negative. Fact Sheet for Patients: SugarRoll.be Fact Sheet for Healthcare Providers: https://www.woods-mathews.com/ This test is not yet approved or cleared by the [place] FDA and  has been authorized for detection and/or diagnosis of SARS-CoV-2 by FDA under an Emergency Use Authorization (EUA). This EUA will remain  in effect (meaning this test can be used) for the duration  of the  COVID-19 declaration under Section 56 4(b)(1) of the Act, 21 U.S.C. section 360bbb-3(b)(1), unless the authorization is terminated or revoked sooner. Performed at [facility], [place] [address] [place], [place] [address]    Acute CVA Patient has a history of atrial fibrillation with a prior stroke She presents to the emergency room for evaluation of dysarthria and left-sided facial droop CT scan of the head without contrast is negative for intracranial hemorrhage CTA notdoneper family's request Patient not a candidate for TPA since she is on Eliquis Aspirin 325mg  po daily Echo pending MRI no acute infarct.  Please see full report Will obtain MRI of the brain, 2D  Carotid ultrasound with severe left internal carotid stenosis, vascular surgery consulted. Vascular recommended carotid stenting but daughter does not want to move forward with this . Vascular signs off.  Neurology consulted recommend orthostatic vitals, EEG, continue Eliquis and if work-up unremarkable no further neurologic intervention Speech therapy rec. Dysphagia 3 (mechanical soft)     Essential Hypertensionwith complications of chronic kidney disease stage III Will allow for permissive hypertension since patient has an acute stroke Renal Function appears to be at baseline Restarted losartan , amlodipine, and beta blk  increase amlodipine to 10mg     History of Atrial Fibrillation Patient on chronic anticoagulation therapy with Eliquis for secondary prophylaxis for an acute stroke Eliquis resumed. Metoprolol Resumed     Hyperthyroidism continue methimazole     Dementia Continue Donepezil     Leukocytosis Unclear etiology, reactive Patient has a wet cough and is at high risk for aspiration Strict aspiration precautions  Time coordinating discharge: Over 30 minutes  SIGNED:   Nolberto Hanlon, MD  [facility] 06/02/2019, 11:23 AM Pager   If 7PM-7AM, please  contact night-coverage [email or web address] Password [email or web address]

## 2019-06-02 NOTE — TOC Transition Note (Signed)
Transition of Care [facility]) - CM/SW Discharge Note   Patient Details  Name: Katelyn [name] MRN: [id number] Date of Birth: 24-Sep-1923  Transition of Care [facility]) CM/SW Contact:  Shelbie Hutching, RN Phone Number: 06/02/2019, 11:52 AM   Clinical Narrative:    DC summary faxed to [facility].    Final next level of care: [place] Barriers to Discharge: No Barriers Identified   Patient Goals and CMS Choice Patient states their goals for this hospitalization and ongoing recovery are:: To return home with family CMS Medicare.gov Compare Post Acute Care list provided to:: Patient Represenative (must comment) Choice offered to / list presented to : Adult Children  Discharge Placement              Patient chooses bed at: (going home with family) Patient to be transferred to facility by: [facility] to take patient home Name of family member notified: [name]- daughter    Discharge Plan and Services   Discharge Planning Services: CM Consult Post Acute Care Choice: Resumption of Svcs/PTA Provider                               Social Determinants of Health (SDOH) Interventions     Readmission Risk Interventions No flowsheet data found.

## 2019-06-02 NOTE — TOC Initial Note (Signed)
Transition of Care [facility]) - Initial/Assessment Note    Patient Details  Name: Katelyn [name] MRN: [id number] Date of Birth: 05/27/1923  [facility]) CM/SW Contact:    Shelbie Hutching, RN Phone Number: 06/02/2019, 10:11 AM  Clinical Narrative:                 Patient admitted to hospital with CVA and altered mental status.  Patient is from home with her daughter and patient is part of the [facility] program.  [facility] MD Dr. Lavone Neri has been updated on plan of care and is aware that patient will discharge today.  [facility] will provide transportation and can be at the hospital at 1200 to pick patient up.  [facility] provides all home care and DME so patient has everything she needs at home already set up.  RNCM will fax over the discharge summary to [facility] at [phone].   Expected Discharge Plan: [place] Barriers to Discharge: No Barriers Identified   Patient Goals and CMS Choice   CMS Medicare.gov Compare Post Acute Care list provided to:: Patient Represenative (must comment) Choice offered to / list presented to : Adult Children(patient daughter's)  Expected Discharge Plan and Services Expected Discharge Plan: [place]   Discharge Planning Services: CM Consult Post Acute Care Choice: Resumption of Svcs/PTA Provider Living arrangements for the past 2 months: Single Family Home                                      Prior Living Arrangements/Services Living arrangements for the past 2 months: Single Family Home   Patient language and need for interpreter reviewed:: Yes Do you feel safe going back to the place where you live?: Yes      Need for Family Participation in Patient Care: Yes (Comment)(syncople episodes, limited mobility) Care giver support system in place?: Yes (comment)(daughter's and [facility]) Current home services: DME([facility] has provided all needed DME) Criminal Activity/Legal Involvement Pertinent to Current  Situation/Hospitalization: No - Comment as needed  Activities of Daily Living Home Assistive Devices/Equipment: Gilford Rile (specify type) ADL Screening (condition at time of admission) Patient's cognitive ability adequate to safely complete daily activities?: Yes Is the patient deaf or have difficulty hearing?: Yes Does the patient have difficulty seeing, even when wearing glasses/contacts?: No Does the patient have difficulty concentrating, remembering, or making decisions?: No Patient able to express need for assistance with ADLs?: Yes Does the patient have difficulty dressing or bathing?: Yes Independently performs ADLs?: No Communication: Independent Dressing (OT): Independent Grooming: Needs assistance Is this a change from baseline?: Pre-admission baseline Feeding: Independent with device (comment) Bathing: Needs assistance Is this a change from baseline?: Pre-admission baseline Toileting: Needs assistance Is this a change from baseline?: Pre-admission baseline In/Out Bed: Needs assistance Is this a change from baseline?: Pre-admission baseline Walks in Home: Dependent Is this a change from baseline?: Pre-admission baseline Does the patient have difficulty walking or climbing stairs?: Yes Weakness of Legs: None Weakness of Arms/Hands: None  Permission Sought/Granted Permission sought to share information with : Case Manager, Family Supports, Other (comment) Permission granted to share information with : Yes, Verbal Permission Granted     Permission granted to share info w AGENCY: [facility]  Permission granted to share info w Relationship: Daughter     Emotional Assessment       Orientation: : Oriented to Self, Oriented to Place  Alcohol / Substance Use: Not Applicable Psych Involvement: No (comment)  Admission diagnosis:  Current use of long term anticoagulation [Z79.01] Aspiration into airway [T17.908A] Acute CVA (cerebrovascular accident) ([place]) [I63.9] Altered mental  status, unspecified altered mental status type [R41.82] Cerebrovascular accident (CVA), unspecified mechanism ([place]) [I63.9] Hematuria, unspecified type [R31.9] Patient Active Problem List   Diagnosis Date Noted  . Leukocytosis 05/31/2019  . Atrial fibrillation with RVR ([place]) 05/31/2019  . Acute CVA (cerebrovascular accident) ([place]) 05/31/2019  . Hyperthyroidism 05/31/2019  . Pressure injury of skin 04/22/2018  . CAP (community acquired pneumonia) 04/21/2018  . Altered mental status   . PAD (peripheral artery disease) ([place]) 06/11/2016  . CVA (cerebral vascular accident) ([place]) 12/23/2014  . CKD (chronic kidney disease) stage 3, GFR 30-59 ml/min 10/20/2014  . Hyperglycemia 10/20/2014  . Anxiety, mild 07/05/2014  . Essential (primary) hypertension 07/05/2014  . Acquired hallux valgus 07/05/2014  . Gastritis, Helicobacter pylori 31/51/7616  . History of colon polyps 07/05/2014  . Arthritis of knee, degenerative 07/05/2014   PCP:  Abelardo Diesel, NP (Inactive) Pharmacy:   [facility] [place], [place] [address] [place] [address] Phone: [phone] Fax: [facility] [place], [place] - [address] [address] Phone: [phone] Fax: [phone]  [place], [place] - [address] [place] [address] Phone: [phone] Fax: [phone]     Social Determinants of Health (SDOH) Interventions    Readmission Risk Interventions No flowsheet data found.

## 2019-10-04 ENCOUNTER — Other Ambulatory Visit: Payer: Self-pay

## 2019-10-04 DIAGNOSIS — Z66 Do not resuscitate: Secondary | ICD-10-CM | POA: Diagnosis not present

## 2019-10-04 DIAGNOSIS — Z800 Family history of malignant neoplasm of digestive organs: Secondary | ICD-10-CM

## 2019-10-04 DIAGNOSIS — H9190 Unspecified hearing loss, unspecified ear: Secondary | ICD-10-CM | POA: Diagnosis present

## 2019-10-04 DIAGNOSIS — R1033 Periumbilical pain: Secondary | ICD-10-CM | POA: Diagnosis not present

## 2019-10-04 DIAGNOSIS — Z833 Family history of diabetes mellitus: Secondary | ICD-10-CM

## 2019-10-04 DIAGNOSIS — M109 Gout, unspecified: Secondary | ICD-10-CM | POA: Diagnosis present

## 2019-10-04 DIAGNOSIS — Z515 Encounter for palliative care: Secondary | ICD-10-CM | POA: Diagnosis not present

## 2019-10-04 DIAGNOSIS — K565 Intestinal adhesions [bands], unspecified as to partial versus complete obstruction: Secondary | ICD-10-CM | POA: Diagnosis not present

## 2019-10-04 DIAGNOSIS — M889 Osteitis deformans of unspecified bone: Secondary | ICD-10-CM | POA: Diagnosis present

## 2019-10-04 DIAGNOSIS — Z8673 Personal history of transient ischemic attack (TIA), and cerebral infarction without residual deficits: Secondary | ICD-10-CM

## 2019-10-04 DIAGNOSIS — M1990 Unspecified osteoarthritis, unspecified site: Secondary | ICD-10-CM | POA: Diagnosis present

## 2019-10-04 DIAGNOSIS — G9341 Metabolic encephalopathy: Secondary | ICD-10-CM | POA: Diagnosis present

## 2019-10-04 DIAGNOSIS — C7951 Secondary malignant neoplasm of bone: Secondary | ICD-10-CM | POA: Diagnosis present

## 2019-10-04 DIAGNOSIS — N184 Chronic kidney disease, stage 4 (severe): Secondary | ICD-10-CM | POA: Diagnosis present

## 2019-10-04 DIAGNOSIS — Z7982 Long term (current) use of aspirin: Secondary | ICD-10-CM

## 2019-10-04 DIAGNOSIS — I4510 Unspecified right bundle-branch block: Secondary | ICD-10-CM | POA: Diagnosis present

## 2019-10-04 DIAGNOSIS — Z7901 Long term (current) use of anticoagulants: Secondary | ICD-10-CM

## 2019-10-04 DIAGNOSIS — I739 Peripheral vascular disease, unspecified: Secondary | ICD-10-CM | POA: Diagnosis present

## 2019-10-04 DIAGNOSIS — F0390 Unspecified dementia without behavioral disturbance: Secondary | ICD-10-CM | POA: Diagnosis present

## 2019-10-04 DIAGNOSIS — C801 Malignant (primary) neoplasm, unspecified: Secondary | ICD-10-CM | POA: Diagnosis present

## 2019-10-04 DIAGNOSIS — M1710 Unilateral primary osteoarthritis, unspecified knee: Secondary | ICD-10-CM | POA: Diagnosis present

## 2019-10-04 DIAGNOSIS — I129 Hypertensive chronic kidney disease with stage 1 through stage 4 chronic kidney disease, or unspecified chronic kidney disease: Secondary | ICD-10-CM | POA: Diagnosis present

## 2019-10-04 DIAGNOSIS — N3001 Acute cystitis with hematuria: Secondary | ICD-10-CM | POA: Diagnosis present

## 2019-10-04 DIAGNOSIS — Z20822 Contact with and (suspected) exposure to covid-19: Secondary | ICD-10-CM | POA: Diagnosis present

## 2019-10-04 DIAGNOSIS — I480 Paroxysmal atrial fibrillation: Secondary | ICD-10-CM | POA: Diagnosis present

## 2019-10-04 DIAGNOSIS — Z79899 Other long term (current) drug therapy: Secondary | ICD-10-CM

## 2019-10-04 DIAGNOSIS — Z91030 Bee allergy status: Secondary | ICD-10-CM

## 2019-10-04 LAB — COMPREHENSIVE METABOLIC PANEL
ALT: 8 U/L (ref 0–44)
AST: 14 U/L — ABNORMAL LOW (ref 15–41)
Albumin: 4 g/dL (ref 3.5–5.0)
Alkaline Phosphatase: 62 U/L (ref 38–126)
Anion gap: 10 (ref 5–15)
BUN: 22 mg/dL (ref 8–23)
CO2: 24 mmol/L (ref 22–32)
Calcium: 9.4 mg/dL (ref 8.9–10.3)
Chloride: 102 mmol/L (ref 98–111)
Creatinine, Ser: 1.08 mg/dL — ABNORMAL HIGH (ref 0.44–1.00)
GFR calc Af Amer: 51 mL/min — ABNORMAL LOW (ref >60)
GFR calc non Af Amer: 44 mL/min — ABNORMAL LOW (ref >60)
Glucose, Bld: 143 mg/dL — ABNORMAL HIGH (ref 70–99)
Potassium: 4.5 mmol/L (ref 3.5–5.1)
Sodium: 136 mmol/L (ref 135–145)
Total Bilirubin: 0.7 mg/dL (ref 0.3–1.2)
Total Protein: 7.3 g/dL (ref 6.5–8.1)

## 2019-10-04 LAB — CBC
HCT: 36.2 % (ref 36.0–46.0)
Hemoglobin: 11.7 g/dL — ABNORMAL LOW (ref 12.0–15.0)
MCH: 31.6 pg (ref 26.0–34.0)
MCHC: 32.3 g/dL (ref 30.0–36.0)
MCV: 97.8 fL (ref 80.0–100.0)
Platelets: 275 10*3/uL (ref 150–400)
RBC: 3.7 MIL/uL — ABNORMAL LOW (ref 3.87–5.11)
RDW: 14.2 % (ref 11.5–15.5)
WBC: 9.1 10*3/uL (ref 4.0–10.5)
nRBC: 0 % (ref 0.0–0.2)

## 2019-10-04 LAB — LIPASE, BLOOD: Lipase: 17 U/L (ref 11–51)

## 2019-10-04 LAB — TROPONIN I (HIGH SENSITIVITY): Troponin I (High Sensitivity): 15 ng/L (ref <18)

## 2019-10-04 MED ORDER — SODIUM CHLORIDE 0.9% FLUSH: 3.0000 mL | Freq: Once | INTRAVENOUS | Status: DC

## 2019-10-04 NOTE — ED Triage Notes (Addendum)
Pt arrives via ems from home, pt has been eating frequently all day today according to the family's report to ems, pt vomited and then passed out when they tried to get her up, pt is reclined in a recliner in triage at this time, pt asking for something to eat. Pt tender to touch on the rlq and a hardened area palpated

## 2019-10-04 NOTE — ED Triage Notes (Signed)
Pt in via EMS from home with c/o abd pain since this am. Per EMS pt reports pt has eaten a large amount today including fruit and junk food. Pt with hx of HTN, stents and A-fib.

## 2019-10-05 ENCOUNTER — Inpatient Hospital Stay
Admission: EM | Admit: 2019-10-05 | Discharge: 2019-10-11 | DRG: 388 | Disposition: E | Payer: Medicare (Managed Care) | Attending: Internal Medicine | Admitting: Internal Medicine

## 2019-10-05 ENCOUNTER — Inpatient Hospital Stay: Payer: Medicare (Managed Care)

## 2019-10-05 ENCOUNTER — Other Ambulatory Visit: Payer: Self-pay

## 2019-10-05 ENCOUNTER — Encounter: Payer: Self-pay | Admitting: Radiology

## 2019-10-05 ENCOUNTER — Emergency Department: Payer: Medicare (Managed Care)

## 2019-10-05 DIAGNOSIS — I129 Hypertensive chronic kidney disease with stage 1 through stage 4 chronic kidney disease, or unspecified chronic kidney disease: Secondary | ICD-10-CM | POA: Diagnosis present

## 2019-10-05 DIAGNOSIS — M889 Osteitis deformans of unspecified bone: Secondary | ICD-10-CM | POA: Diagnosis present

## 2019-10-05 DIAGNOSIS — I480 Paroxysmal atrial fibrillation: Secondary | ICD-10-CM

## 2019-10-05 DIAGNOSIS — C7951 Secondary malignant neoplasm of bone: Secondary | ICD-10-CM | POA: Diagnosis present

## 2019-10-05 DIAGNOSIS — N184 Chronic kidney disease, stage 4 (severe): Secondary | ICD-10-CM | POA: Diagnosis present

## 2019-10-05 DIAGNOSIS — Z8673 Personal history of transient ischemic attack (TIA), and cerebral infarction without residual deficits: Secondary | ICD-10-CM | POA: Diagnosis not present

## 2019-10-05 DIAGNOSIS — Z7982 Long term (current) use of aspirin: Secondary | ICD-10-CM | POA: Diagnosis not present

## 2019-10-05 DIAGNOSIS — Z7901 Long term (current) use of anticoagulants: Secondary | ICD-10-CM | POA: Diagnosis not present

## 2019-10-05 DIAGNOSIS — Z515 Encounter for palliative care: Secondary | ICD-10-CM

## 2019-10-05 DIAGNOSIS — M1710 Unilateral primary osteoarthritis, unspecified knee: Secondary | ICD-10-CM | POA: Diagnosis present

## 2019-10-05 DIAGNOSIS — Z79899 Other long term (current) drug therapy: Secondary | ICD-10-CM | POA: Diagnosis not present

## 2019-10-05 DIAGNOSIS — I4510 Unspecified right bundle-branch block: Secondary | ICD-10-CM | POA: Diagnosis present

## 2019-10-05 DIAGNOSIS — Z7189 Other specified counseling: Secondary | ICD-10-CM | POA: Diagnosis not present

## 2019-10-05 DIAGNOSIS — I10 Essential (primary) hypertension: Secondary | ICD-10-CM | POA: Diagnosis present

## 2019-10-05 DIAGNOSIS — Z66 Do not resuscitate: Secondary | ICD-10-CM | POA: Diagnosis not present

## 2019-10-05 DIAGNOSIS — G9341 Metabolic encephalopathy: Secondary | ICD-10-CM

## 2019-10-05 DIAGNOSIS — R1033 Periumbilical pain: Secondary | ICD-10-CM | POA: Diagnosis not present

## 2019-10-05 DIAGNOSIS — N3001 Acute cystitis with hematuria: Secondary | ICD-10-CM | POA: Diagnosis present

## 2019-10-05 DIAGNOSIS — Z20822 Contact with and (suspected) exposure to covid-19: Secondary | ICD-10-CM | POA: Diagnosis present

## 2019-10-05 DIAGNOSIS — I739 Peripheral vascular disease, unspecified: Secondary | ICD-10-CM | POA: Diagnosis present

## 2019-10-05 DIAGNOSIS — R4182 Altered mental status, unspecified: Secondary | ICD-10-CM | POA: Diagnosis not present

## 2019-10-05 DIAGNOSIS — C801 Malignant (primary) neoplasm, unspecified: Secondary | ICD-10-CM | POA: Diagnosis present

## 2019-10-05 DIAGNOSIS — Z91030 Bee allergy status: Secondary | ICD-10-CM | POA: Diagnosis not present

## 2019-10-05 DIAGNOSIS — K56609 Unspecified intestinal obstruction, unspecified as to partial versus complete obstruction: Secondary | ICD-10-CM | POA: Diagnosis not present

## 2019-10-05 DIAGNOSIS — M1990 Unspecified osteoarthritis, unspecified site: Secondary | ICD-10-CM | POA: Diagnosis present

## 2019-10-05 DIAGNOSIS — H9190 Unspecified hearing loss, unspecified ear: Secondary | ICD-10-CM | POA: Diagnosis present

## 2019-10-05 DIAGNOSIS — M109 Gout, unspecified: Secondary | ICD-10-CM | POA: Diagnosis present

## 2019-10-05 DIAGNOSIS — R079 Chest pain, unspecified: Secondary | ICD-10-CM

## 2019-10-05 DIAGNOSIS — K565 Intestinal adhesions [bands], unspecified as to partial versus complete obstruction: Secondary | ICD-10-CM | POA: Diagnosis not present

## 2019-10-05 DIAGNOSIS — F0390 Unspecified dementia without behavioral disturbance: Secondary | ICD-10-CM | POA: Diagnosis present

## 2019-10-05 LAB — SARS CORONAVIRUS 2 BY RT PCR (HOSPITAL ORDER, PERFORMED IN ~~LOC~~ HOSPITAL LAB): SARS Coronavirus 2: NEGATIVE

## 2019-10-05 MED ORDER — ONDANSETRON HCL 4 MG PO TABS: 4.0000 mg | ORAL_TABLET | Freq: Four times a day (QID) | ORAL | Status: DC | PRN

## 2019-10-05 MED ORDER — ACETAMINOPHEN 650 MG RE SUPP: 650.0000 mg | Freq: Four times a day (QID) | RECTAL | Status: DC | PRN

## 2019-10-05 MED ORDER — DEXTROSE-NACL 5-0.45 % IV SOLN: INTRAVENOUS | Status: DC

## 2019-10-05 MED ORDER — MORPHINE SULFATE (PF) 4 MG/ML IV SOLN
4.0000 mg | Freq: Once | INTRAVENOUS | Status: AC
  Administered 2019-10-05: 4 mg via INTRAVENOUS
  Filled 2019-10-05: qty 1

## 2019-10-05 MED ORDER — MORPHINE SULFATE (PF) 2 MG/ML IV SOLN: 1.0000 mg | INTRAVENOUS | Status: DC | PRN

## 2019-10-05 MED ORDER — ATROPINE SULFATE 1 % OP SOLN
2.0000 [drp] | OPHTHALMIC | Status: DC | PRN
  Filled 2019-10-05: qty 2

## 2019-10-05 MED ORDER — HYDRALAZINE HCL 20 MG/ML IJ SOLN: 10.0000 mg | Freq: Four times a day (QID) | INTRAMUSCULAR | Status: DC | PRN

## 2019-10-05 MED ORDER — SODIUM CHLORIDE 0.9 % IV SOLN: INTRAVENOUS | Status: DC

## 2019-10-05 MED ORDER — ENOXAPARIN SODIUM 40 MG/0.4ML ~~LOC~~ SOLN
40.0000 mg | SUBCUTANEOUS | Status: DC
  Administered 2019-10-05: 40 mg via SUBCUTANEOUS
  Filled 2019-10-05: qty 0.4

## 2019-10-05 MED ORDER — ONDANSETRON HCL 4 MG/2ML IJ SOLN
4.0000 mg | Freq: Four times a day (QID) | INTRAMUSCULAR | Status: DC | PRN
  Administered 2019-10-05: 4 mg via INTRAVENOUS
  Filled 2019-10-05: qty 2

## 2019-10-05 MED ORDER — PANTOPRAZOLE SODIUM 40 MG IV SOLR
40.0000 mg | INTRAVENOUS | Status: DC
  Administered 2019-10-05 – 2019-10-07 (×3): 40 mg via INTRAVENOUS
  Filled 2019-10-05 (×3): qty 40

## 2019-10-05 MED ORDER — ACETAMINOPHEN 325 MG PO TABS: 650.0000 mg | ORAL_TABLET | Freq: Four times a day (QID) | ORAL | Status: DC | PRN

## 2019-10-05 MED ORDER — IOHEXOL 300 MG/ML  SOLN
100.0000 mL | Freq: Once | INTRAMUSCULAR | Status: AC | PRN
  Administered 2019-10-05: 100 mL via INTRAVENOUS

## 2019-10-05 NOTE — ED Provider Notes (Signed)
[facility] Emergency Department Provider Note  ____________________________________________   First MD Initiated Contact with Patient 10/07/2019 [phone]     (approximate)  I have reviewed the triage vital signs and the nursing notes.  History review of system limited secondary to altered mental status. HISTORY  Chief Complaint Abdominal Pain, Emesis, and Loss of Consciousness    HPI Katelyn [name] is a 86 y.o. female with below list of previous medical conditions  presents to the emergency department via EMS from home secondary to reported generalized abdominal pain.  Patient does states that her belly hurts".  However unable to get any additional history from the patient.        Past Medical History:  Diagnosis Date  . Abdominal pain   . Anxiety   . Atherosclerosis   . Chronic kidney disease    stage 3  . Colon polyps   . Constipation   . Cough with expectoration   . Dependent edema bilateral legs  . Gait abnormality   . Gout   . Hearing loss   . Hearing loss   . Hemorrhoids   . Hypertension   . Incontinence   . Osteoarthritis   . Osteoarthritis   . Osteoarthritis   . Paget's disease of bone   . Peripheral vascular disease ([place])   . Pneumonia   . Proteinuria   . Stroke [facility]     Patient Active Problem List   Diagnosis Date Noted  . SBO (small bowel obstruction) ([place]) 09/15/2019  . AF (paroxysmal atrial fibrillation) ([place]) 09/19/2019  . Bony metastasis ([place]) 10/04/2019  . CKD (chronic kidney disease) stage 4, GFR 15-29 ml/min (HCC) 05/31/2019  . Leukocytosis 05/31/2019  . Atrial fibrillation with RVR ([place]) 05/31/2019  . Acute CVA (cerebrovascular accident) ([place]) 05/31/2019  . Hyperthyroidism 05/31/2019  . Pressure injury of skin 04/22/2018  . CAP (community acquired pneumonia) 04/21/2018  . Altered mental status   . PAD (peripheral artery disease) ([place]) 06/11/2016  . CVA (cerebral vascular accident) ([place]) 12/23/2014  .  CKD (chronic kidney disease) stage 3, GFR 30-59 ml/min 10/20/2014  . Hyperglycemia 10/20/2014  . Anxiety, mild 07/05/2014  . Essential (primary) hypertension 07/05/2014  . Acquired hallux valgus 07/05/2014  . Gastritis, Helicobacter pylori 37/90/2409  . History of colon polyps 07/05/2014  . Arthritis of knee, degenerative 07/05/2014    Past Surgical History:  Procedure Laterality Date  . PARTIAL HYSTERECTOMY    . PERIPHERAL VASCULAR CATHETERIZATION N/A 01/13/2015   Procedure: Abdominal Aortogram w/Lower Extremity;  Surgeon: Algernon Huxley, MD;  Location: [place] [facility];  Service: Cardiovascular;  Laterality: N/A;  . PERIPHERAL VASCULAR CATHETERIZATION  01/13/2015   Procedure: Lower Extremity Intervention;  Surgeon: Algernon Huxley, MD;  Location: [place] [facility];  Service: Cardiovascular;;    Prior to Admission medications   Medication Sig Start Date End Date Taking? Authorizing Provider  acetaminophen (TYLENOL) 650 MG CR tablet Take 1,300 mg by mouth every 8 (eight) hours as needed for pain.   Yes [provider]  amLODipine (NORVASC) 10 MG tablet Take 1 tablet (10 mg total) by mouth daily. 06/03/19  Yes Nolberto Hanlon, MD  apixaban (ELIQUIS) 2.5 MG TABS tablet Take 1 tablet (2.5 mg total) by mouth 2 (two) times daily. 01/13/15  Yes Algernon Huxley, MD  aspirin EC 325 MG EC tablet Take 1 tablet (325 mg total) by mouth daily. 06/03/19  Yes Nolberto Hanlon, MD  famotidine (PEPCID) 20 MG tablet Take 1 tablet (  20 mg total) by mouth daily. 06/03/19  Yes Nolberto Hanlon, MD  fluticasone (FLONASE) 50 MCG/ACT nasal spray Place 2 sprays into both nostrils daily. 09/03/14  Yes Glean Hess, MD  LORazepam (ATIVAN) 0.5 MG tablet Take 0.5 mg by mouth 3 (three) times daily.    Yes [provider]  losartan (COZAAR) 50 MG tablet Take 50 mg by mouth daily.   Yes [provider]  methimazole (TAPAZOLE) 5 MG tablet Take 2.5 mg by mouth 2 (two) times daily.    Yes [provider]  metoprolol succinate (TOPROL-XL) 25 MG 24 hr tablet Take 25 mg by mouth daily.   Yes [provider]  Polyethyl Glycol-Propyl Glycol 0.4-0.3 % SOLN Apply 1 drop to eye every 4 (four) hours as needed (dryness).   Yes [provider]  alum & mag hydroxide-simeth (MAALOX PLUS) 400-400-40 MG/5ML suspension Take 5 mLs by mouth every 4 (four) hours as needed for indigestion.    [provider]  atropine 1 % ophthalmic solution Place 2 drops under the tongue every hour as needed (excessive secretions).    [provider]  guaiFENesin-dextromethorphan (ROBITUSSIN DM) 100-10 MG/5ML syrup Take 5 mLs by mouth every 6 (six) hours as needed for cough. 04/23/18   Henreitta Leber, MD  hydrocortisone (ANUSOL-HC) 2.5 % rectal cream Place 1 application rectally 2 (two) times daily as needed for hemorrhoids.    [provider]  ondansetron (ZOFRAN-ODT) 4 MG disintegrating tablet Take 4 mg by mouth every 4 (four) hours as needed for nausea or vomiting.    [provider]  polyethylene glycol (MIRALAX / GLYCOLAX) packet Take 17 g by mouth daily.    [provider]  sennosides-docusate sodium (SENOKOT-S) 8.6-50 MG tablet Take 1 tablet by mouth 2 (two) times daily as needed for constipation. 04/23/18   Henreitta Leber, MD  Vitamins A & D (VITAMIN A & D) ointment Apply 1 application topically 2 (two) times daily as needed for dry skin (itchy skin).    [provider]  Zinc Oxide 13 % CREA Place 1 application rectally 4 (four) times daily as needed (painful hemorrhoids).    [provider]    Allergies Bee venom  Family History  Problem Relation Age of Onset  . Diabetes Mother   . Colon cancer Father   . Cancer Sister   . Cancer Sister   . Cancer Sister     Social History Social History   Tobacco Use  . Smoking status: Never Smoker  . Smokeless tobacco: Never Used  Substance Use Topics  . Alcohol use: No    Alcohol/week:  0.0 standard drinks  . Drug use: No    Review of Systems Constitutional: No fever/chills Eyes: No visual changes. ENT: No sore throat. Cardiovascular: Denies chest pain. Respiratory: Denies shortness of breath. Gastrointestinal: Positive for abdominal pain.  No nausea, no vomiting.  No diarrhea.  No constipation. Genitourinary: Negative for dysuria. Musculoskeletal: Negative for neck pain.  Negative for back pain. Integumentary: Negative for rash. Neurological: Negative for headaches, focal weakness or numbness.   ____________________________________________   PHYSICAL EXAM:  VITAL SIGNS: ED Triage Vitals  Enc Vitals Group     BP 10/04/19 1650 (!) 134/65     Pulse Rate 10/04/19 1650 72     Resp 10/04/19 1650 16     Temp 10/04/19 1650 98 F (36.7 C)     Temp Source 10/04/19 1650 Oral     SpO2 10/04/19 1650  100 %     Weight 10/04/19 1651 64 kg (141 lb 1.5 oz)     Height 10/04/19 1651 1.727 m (5\' 8" )     Head Circumference --      Peak Flow --      Pain Score --      Pain Loc --      Pain Edu? --      Excl. in [place]? --     Constitutional: Alert and oriented.  Eyes: Conjunctivae are normal.  Head: Atraumatic. Mouth/Throat: Patient is wearing a mask. Neck: No stridor.  No meningeal signs.   Cardiovascular: Normal rate, regular rhythm. Good peripheral circulation. Grossly normal heart sounds. Respiratory: Normal respiratory effort.  No retractions. Gastrointestinal: Generalized tenderness to palpation worse left upper and left lower quadrant.. No distention.   Musculoskeletal: No lower extremity tenderness nor edema. No gross deformities of extremities. Neurologic:  . No gross focal neurologic deficits are appreciated.  Skin:  Skin is warm, dry and intact. Psychiatric: Mood and affect are normal. Speech and behavior are normal.  ____________________________________________   LABS (all labs ordered are listed, but only abnormal results are displayed)  Labs Reviewed    COMPREHENSIVE METABOLIC PANEL - Abnormal; Notable for the following components:      Result Value   Glucose, Bld 143 (*)    Creatinine, Ser 1.08 (*)    AST 14 (*)    GFR calc non Af Amer 44 (*)    GFR calc Af Amer 51 (*)    All other components within normal limits  CBC - Abnormal; Notable for the following components:   RBC 3.70 (*)    Hemoglobin 11.7 (*)    All other components within normal limits  SARS CORONAVIRUS 2 BY RT PCR (HOSPITAL ORDER, [place] [facility])  LIPASE, BLOOD  URINALYSIS, COMPLETE (UACMP) WITH MICROSCOPIC  BASIC METABOLIC PANEL  CBC  TROPONIN I (HIGH SENSITIVITY)   ____________________________________________  EKG  ED ECG REPORT I, [place] Cortne Amara, the attending physician, personally viewed and interpreted this ECG.   Date: 10/04/2019  EKG Time: 5:01 PM  Rate: 68  Rhythm: Sinus rhythm with premature ventricular contraction  Axis: Leftward axis deviation  Intervals:  ST&T Change: None  ____________________________________________  RADIOLOGY I, [place] Ernst Bowler, personally viewed and evaluated these images (plain radiographs) as part of my medical decision making, as well as reviewing the written report by the radiologist.  ED MD interpretation:    Official radiology report(s): CT HEAD WO CONTRAST  Result Date: 09/25/2019 CLINICAL DATA:  Mental status change. EXAM: CT HEAD WITHOUT CONTRAST TECHNIQUE: Contiguous axial images were obtained from the base of the skull through the vertex without intravenous contrast. COMPARISON:  05/31/2019 CT MRI head. FINDINGS: Brain: No acute infarct or intracranial hemorrhage. Sequela of remote infarcts involving the right corona radiata/basal ganglia and left parietooccipital region. Mild diffuse parenchymal volume loss with ex vacuo dilatation. Background scattered and confluent hypodense foci involving the periventricular and deep white matter nonspecific however commonly associated with chronic  microvascular ischemic changes. No mass lesion. No midline shift or extra-axial fluid collection. Vascular: Residual intravenous contrast opacifies the major intracranial vessels. Bilateral carotid siphon atherosclerotic calcifications. Skull: Negative for fracture or focal lesion. Sinuses/Orbits: Normal orbits. Partial right frontal sinus/recess opacification with hyperostosis, unchanged. No mastoid effusion. Other: None. IMPRESSION: No acute intracranial process. Remote left parietooccipital and right corona radiata/basal ganglia insults. Mild cerebral atrophy and chronic microvascular ischemic changes. Electronically Signed   By: Georga Kaufmann  [place] M.D.   On: 09/24/2019 09:30   CT ABDOMEN PELVIS W CONTRAST  Result Date: 09/24/2019 CLINICAL DATA:  Abdominal pain EXAM: CT ABDOMEN AND PELVIS WITH CONTRAST TECHNIQUE: Multidetector CT imaging of the abdomen and pelvis was performed using the standard protocol following bolus administration of intravenous contrast. CONTRAST:  18mL OMNIPAQUE IOHEXOL 300 MG/ML  SOLN COMPARISON:  December 20, 2017 FINDINGS: Lower chest: There is bibasilar atelectatic change. There is lower lobe bronchiectatic change. No lung base consolidation noted. There is a pericardial effusion, incompletely visualized. Hepatobiliary: There is a degree of hepatic steatosis. No focal liver lesions are evident. There is cholelithiasis with an apparent gallstone measuring 2.7 cm. Gallbladder wall does not appear overtly thickened. There is no biliary duct dilatation. Pancreas: No pancreatic mass or inflammatory focus. Spleen: No splenic lesions are evident. Adrenals/Urinary Tract: Adrenals bilaterally appear normal. There is scarring in each kidney. There are cysts in the left kidney, largest measuring 1.2 x 1.2 cm. No appreciable hydronephrosis on either side. There is a calculus in the lower pole of the right kidney measuring 0.9 x 0.9 cm. There is a 3 mm calculus in the posterior mid left kidney.  No ureteral calculus is appreciable on either side. Urinary bladder is midline with wall thickness within normal limits. Stomach/Bowel: Distal sigmoid colon and rectum are distended by stool. There is no appreciable rectal wall thickening. There is small bowel dilatation to the level of the distal ileum consistent with a degree of bowel obstruction. No free air or portal venous air is appreciable on this study. Vascular/Lymphatic: There is no abdominal aortic aneurysm. There is aortic and iliac artery atherosclerosis as well as atherosclerotic calcification in more distal pelvic arterial vessels. Major venous structures appear patent. No evident adenopathy in the abdomen or pelvis. Reproductive: Uterus absent.  No evident pelvic mass. Other: No periappendiceal region inflammation evident. No abscess evident in the abdomen or pelvis. There is mild ascites in the upper abdomen. Musculoskeletal: There are mixed blastic and lytic bone lesions throughout the pelvis consistent with metastatic disease of uncertain etiology. No intramuscular lesions are appreciable. IMPRESSION: 1.  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 2. Small bowel obstruction with transition zone in the distal ileal region. 3. Rectum and distal sigmoid colon distended by stool without appreciable wall thickening in these areas. 4.  Mild ascites. 5.  Cholelithiasis. 6. Calculi in each kidney, nonobstructing. Scarring in each kidney. No hydronephrosis on either side. No ureteral calculi. 7.  Aortic Atherosclerosis (ICD10-I70.0). 8.  Uterus and gallbladder absent. 9.  Incomplete visualization of pericardial effusion. Electronically Signed   By: Lowella Grip III M.D.   On: 09/20/2019 07:05   DG Chest Port 1 View  Result Date: 10/04/2019 CLINICAL DATA:  Abdominal pain EXAM: PORTABLE CHEST 1 VIEW COMPARISON:  05/31/2019 FINDINGS: Cardiomegaly. Chronic upper mediastinal widening with rightward tracheal deviation from a large nodular  thyroid gland with mediastinal extension. Upper normal heart size. There is no edema, consolidation, effusion, or pneumothorax. There is a gas distended structure in the epigastric region which could be stomach or transverse colon. Accounting for the gastric bubble, no suspected pneumoperitoneum. IMPRESSION: No acute finding when compared to prior. Electronically Signed   By: Monte Fantasia M.D.   On: 10/06/2019 04:47    ____________________________________________   PROCEDURES   Procedure(s) performed (including Critical Care):  Procedures   ____________________________________________   INITIAL IMPRESSION / MDM / [place] / ED COURSE  As part of my medical decision making, I  reviewed the following data within the [place] NUMBER  86 year old female presented with above-stated history and physical exam with very broad differential diagnosis including intra-abdominal pathology including but certainly not limited to bowel obstruction cholelithiasis diverticulitis, perforated bowel.  CT scan of the abdomen pelvis performed revealed small bowel obstruction with what appears to be widespread bone metastastic disease.  NG tube was ordered.  Plan to admit the patient for further evaluation and management after official CT scan report obtained.  Patient care transferred to Dr. Charna Archer     Clinical Course as of Oct 05 2235  Mon Oct 05, 2019  0339 WBC: 9.1 [JJ]    Clinical Course User Index [JJ] Candis Shine, Student-PA     ____________________________________________  FINAL CLINICAL IMPRESSION(S) / ED DIAGNOSES  Small bowel obstruction Metastatic disease  MEDICATIONS GIVEN DURING THIS VISIT:  Medications  sodium chloride flush (NS) 0.9 % injection 3 mL (has no administration in time range)  atropine 1 % ophthalmic solution 2 drop (has no administration in time range)  enoxaparin (LOVENOX) injection 40 mg (40 mg Subcutaneous Given 09/11/2019 1020)  0.9 %   sodium chloride infusion ( Intravenous New Bag/Given 09/13/2019 2226)  ondansetron (ZOFRAN) tablet 4 mg ( Oral See Alternative 10/06/2019 1001)    Or  ondansetron (ZOFRAN) injection 4 mg (4 mg Intravenous Given 10/08/2019 1001)  acetaminophen (TYLENOL) tablet 650 mg (has no administration in time range)    Or  acetaminophen (TYLENOL) suppository 650 mg (has no administration in time range)  pantoprazole (PROTONIX) injection 40 mg (40 mg Intravenous Given 09/22/2019 1020)  hydrALAZINE (APRESOLINE) injection 10 mg (has no administration in time range)  morphine 2 MG/ML injection 1 mg (has no administration in time range)  iohexol (OMNIPAQUE) 300 MG/ML solution 100 mL (100 mLs Intravenous Contrast Given 09/14/2019 0634)  morphine 4 MG/ML injection 4 mg (4 mg Intravenous Given 10/07/2019 1002)     ED Discharge Orders    None      *Please note:  Uchechi Denison Costin was evaluated in Emergency Department on 09/23/2019 for the symptoms described in the history of present illness. She was evaluated in the context of the global COVID-19 pandemic, which necessitated consideration that the patient might be at risk for infection with the SARS-CoV-2 virus that causes COVID-19. Institutional protocols and algorithms that pertain to the evaluation of patients at risk for COVID-19 are in a state of rapid change based on information released by regulatory bodies including the CDC and federal and state organizations. These policies and algorithms were followed during the patient's care in the ED.  Some ED evaluations and interventions may be delayed as a result of limited staffing during and after the pandemic.*  Note:  This document was prepared using Dragon voice recognition software and may include unintentional dictation errors.   Gregor Hams, MD 09/17/2019 2240

## 2019-10-05 NOTE — ED Notes (Signed)
No vomiting noted. Pt resting calmly in bed. Bed locked low. Rails up. Call bell within reach.

## 2019-10-05 NOTE — Consult Note (Signed)
[facility] SURGICAL CONSULTATION NOTE (initial) - cpt: [id number]   HISTORY OF PRESENT ILLNESS (HPI):  86 y.o. female presented to [facility] ED today for evaluation of abdominal pain and emesis. Patient has a history of dementia which significantly limits the history. From chart review, it appears she has had periumbilical abdominal pain since last night which started after dinner accompanied with emesis. Uncertain of any additional symptoms or history of similar. Only previous surgical history on record is abdominal hysterectomy, which daughter confirmed. Work up in the ED revealed relatively reassuring labs with baseline mild renal disease appreciated, however, on CT Abdomen/Pelvis was concerning for small bowel dilation consistent with obstructive picture, significant rectal stool burden, and widespread bony metastatic disease.   Surgery is consulted by hospitalist physician Dr. Clarnce Flock, MD in this context for evaluation and management of small bowel obstruction.   PAST MEDICAL HISTORY (PMH):  Past Medical History:  Diagnosis Date  . Abdominal pain   . Anxiety   . Atherosclerosis   . Chronic kidney disease    stage 3  . Colon polyps   . Constipation   . Cough with expectoration   . Dependent edema bilateral legs  . Gait abnormality   . Gout   . Hearing loss   . Hearing loss   . Hemorrhoids   . Hypertension   . Incontinence   . Osteoarthritis   . Osteoarthritis   . Osteoarthritis   . Paget's disease of bone   . Peripheral vascular disease ([place])   . Pneumonia   . Proteinuria   . Stroke [facility])      PAST SURGICAL HISTORY ([place]):  Past Surgical History:  Procedure Laterality Date  . PARTIAL HYSTERECTOMY    . PERIPHERAL VASCULAR CATHETERIZATION N/A 01/13/2015   Procedure: Abdominal Aortogram w/Lower Extremity;  Surgeon: Algernon Huxley, MD;  Location: [place] [facility];  Service: Cardiovascular;  Laterality: N/A;  . PERIPHERAL VASCULAR CATHETERIZATION  01/13/2015    Procedure: Lower Extremity Intervention;  Surgeon: Algernon Huxley, MD;  Location: [place] [facility];  Service: Cardiovascular;;     MEDICATIONS:  Prior to Admission medications   Medication Sig Start Date End Date Taking? Authorizing Provider  acetaminophen (TYLENOL) 325 MG tablet Take 2 tablets (650 mg total) by mouth every 6 (six) hours as needed for mild pain (or Fever >/= 101). Patient not taking: Reported on 04/21/2018 01/29/17   Gladstone Lighter, MD  alum & mag hydroxide-simeth (MAALOX PLUS) 400-400-40 MG/5ML suspension Take 5 mLs by mouth every 4 (four) hours as needed for indigestion.    [provider]  amLODipine (NORVASC) 10 MG tablet Take 1 tablet (10 mg total) by mouth daily. 06/03/19   Nolberto Hanlon, MD  apixaban (ELIQUIS) 2.5 MG TABS tablet Take 1 tablet (2.5 mg total) by mouth 2 (two) times daily. 01/13/15   Algernon Huxley, MD  aspirin EC 325 MG EC tablet Take 1 tablet (325 mg total) by mouth daily. 06/03/19   Nolberto Hanlon, MD  atorvastatin (LIPITOR) 40 MG tablet Take 1 tablet (40 mg total) by mouth daily at 6 PM. 01/29/17   Gladstone Lighter, MD  atropine 1 % ophthalmic solution Place 2 drops under the tongue every hour as needed (excessive secretions).    [provider]  donepezil (ARICEPT) 10 MG tablet Take 1 tablet (10 mg total) by mouth daily. 04/23/18   Henreitta Leber, MD  famotidine (PEPCID) 20 MG tablet Take 1 tablet (20 mg total) by mouth daily.  06/03/19   Nolberto Hanlon, MD  fluticasone (FLONASE) 50 MCG/ACT nasal spray Place 2 sprays into both nostrils daily. 09/03/14   Glean Hess, MD  guaiFENesin-dextromethorphan (ROBITUSSIN DM) 100-10 MG/5ML syrup Take 5 mLs by mouth every 6 (six) hours as needed for cough. 04/23/18   Henreitta Leber, MD  hydrocortisone (ANUSOL-HC) 2.5 % rectal cream Place 1 application rectally 2 (two) times daily as needed for hemorrhoids.    [provider]  LORazepam (ATIVAN) 0.5 MG tablet Take 0.5 mg by mouth 3  (three) times daily.     [provider]  losartan (COZAAR) 50 MG tablet Take 50 mg by mouth daily.    [provider]  methimazole (TAPAZOLE) 5 MG tablet Take 2.5 mg by mouth 2 (two) times daily.     [provider]  metoprolol succinate (TOPROL-XL) 25 MG 24 hr tablet Take 25 mg by mouth daily.    [provider]  ondansetron (ZOFRAN-ODT) 4 MG disintegrating tablet Take 4 mg by mouth every 4 (four) hours as needed for nausea or vomiting.    [provider]  polyethylene glycol (MIRALAX / GLYCOLAX) packet Take 17 g by mouth daily.    [provider]  sennosides-docusate sodium (SENOKOT-S) 8.6-50 MG tablet Take 1 tablet by mouth 2 (two) times daily as needed for constipation. 04/23/18   Henreitta Leber, MD  Vitamins A & D (VITAMIN A & D) ointment Apply 1 application topically 2 (two) times daily as needed for dry skin (itchy skin).    [provider]  Zinc Oxide 13 % CREA Place 1 application rectally 4 (four) times daily as needed (painful hemorrhoids).    [provider]     ALLERGIES:  Allergies  Allergen Reactions  . Bee Venom Anaphylaxis     SOCIAL HISTORY:  Social History   Socioeconomic History  . Marital status: Widowed    Spouse name: Not on file  . Number of children: Not on file  . Years of education: Not on file  . Highest education level: Not on file  Occupational History  . Not on file  Tobacco Use  . Smoking status: Never Smoker  . Smokeless tobacco: Never Used  Substance and Sexual Activity  . Alcohol use: No    Alcohol/week: 0.0 standard drinks  . Drug use: No  . Sexual activity: Not on file  Other Topics Concern  . Not on file  Social History Narrative   Lives at home with family.  Wheelchair-bound at baseline   Social Determinants of Radio broadcast assistant Strain:   . Difficulty of Paying Living Expenses:   Food Insecurity:   . Worried About Charity fundraiser in the Last Year:    . Arboriculturist in the Last Year:   Transportation Needs:   . Film/video editor (Medical):   Marland Kitchen Lack of Transportation (Non-Medical):   Physical Activity:   . Days of Exercise per Week:   . Minutes of Exercise per Session:   Stress:   . Feeling of Stress :   Social Connections:   . Frequency of Communication with Friends and Family:   . Frequency of Social Gatherings with Friends and Family:   . Attends Religious Services:   . Active Member of Clubs or Organizations:   . Attends Archivist Meetings:   Marland Kitchen Marital Status:   Intimate Partner Violence:   . Fear of Current or Ex-Partner:   . Emotionally Abused:   .  Physically Abused:   . Sexually Abused:      FAMILY HISTORY:  Family History  Problem Relation Age of Onset  . Diabetes Mother   . Colon cancer Father   . Cancer Sister   . Cancer Sister   . Cancer Sister       REVIEW OF SYSTEMS:  Review of Systems  Unable to perform ROS: Dementia  Gastrointestinal: Positive for abdominal pain and vomiting.    VITAL SIGNS:  Temp:  [97.7 F (36.5 C)-98.9 F (37.2 C)] 98.9 F (37.2 C) (07/26 0109) Pulse Rate:  [65-90] 90 (07/26 0617) Resp:  [15-23] 16 (07/26 0617) BP: (122-138)/(56-109) 138/68 (07/26 0617) SpO2:  [92 %-100 %] 100 % (07/26 0617) Weight:  [64 kg] 64 kg (07/25 1651)     Height: 5\' 8"  (172.7 cm) Weight: 64 kg BMI (Calculated): 21.46   INTAKE/OUTPUT:  No intake/output data recorded.  PHYSICAL EXAM:  Physical Exam Vitals and nursing note reviewed. Exam conducted with a chaperone present.  Constitutional:      General: She is not in acute distress.    Appearance: She is well-developed. She is obese. She is not ill-appearing.     Comments: Patient resting in bed, daughter at bedside, she responds to verbal stimuli but does not open eyes  HENT:     Head: Normocephalic and atraumatic.  Cardiovascular:     Rate and Rhythm: Normal rate and regular rhythm.     Heart sounds: Normal heart  sounds. No murmur heard.   Pulmonary:     Effort: Pulmonary effort is normal. No respiratory distress.  Abdominal:     General: A surgical scar is present. There is no distension.     Palpations: Abdomen is soft.     Tenderness: There is abdominal tenderness in the periumbilical area. There is no guarding or rebound.     Comments: Abdomen is soft, states "it hurts around my navel" with palpation, no rebound/guarding, non-distended  Genitourinary:    Comments: Deferred Skin:    General: Skin is warm and dry.     Coloration: Skin is not jaundiced or pale.  Neurological:     Mental Status: She is alert.     Comments: Unable to reliably assess  Psychiatric:     Comments: Unable to reliably assess      Labs:  CBC Latest Ref Rng & Units 10/04/2019 06/02/2019 05/31/2019  WBC 4.0 - 10.5 K/uL 9.1 6.5 16.4(H)  Hemoglobin 12.0 - 15.0 g/dL 11.7(L) 11.0(L) 11.5(L)  Hematocrit 36 - 46 % 36.2 34.9(L) 35.8(L)  Platelets 150 - 400 K/uL 275 203 222   CMP Latest Ref Rng & Units 10/04/2019 05/31/2019 04/23/2018  Glucose 70 - 99 mg/dL 143(H) 176(H) 97  BUN 8 - 23 mg/dL 22 27(H) 18  Creatinine 0.44 - 1.00 mg/dL 1.08(H) 1.25(H) 0.83  Sodium 135 - 145 mmol/L 136 140 139  Potassium 3.5 - 5.1 mmol/L 4.5 3.8 3.7  Chloride 98 - 111 mmol/L 102 106 108  CO2 22 - 32 mmol/L 24 24 24   Calcium 8.9 - 10.3 mg/dL 9.4 9.1 8.5(L)  Total Protein 6.5 - 8.1 g/dL 7.3 7.2 -  Total Bilirubin 0.3 - 1.2 mg/dL 0.7 0.6 -  Alkaline Phos 38 - 126 U/L 62 57 -  AST 15 - 41 U/L 14(L) 21 -  ALT 0 - 44 U/L 8 8 -    Imaging studies:   CT Abdomen/Pelvis (09/30/2019) personally reviewed showing small bowel dilation concerning for small bowel obstruction and significant  stool burden in the rectum, and radiologist report reviewed: IMPRESSION: 1.  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  2. Small bowel obstruction with transition zone in the distal ileal region.  3. Rectum and distal sigmoid colon distended  by stool without appreciable wall thickening in these areas.  4.  Mild ascites.  5.  Cholelithiasis.  6. Calculi in each kidney, nonobstructing. Scarring in each kidney. No hydronephrosis on either side. No ureteral calculi.  7.  Aortic Atherosclerosis (ICD10-I70.0).  8.  Uterus and gallbladder absent.  9.  Incomplete visualization of pericardial effusion.   Assessment/Plan: (ICD-10's: K47.609) 86 y.o. female with abdominal pain and emesis found to have likely small bowel obstruction secondary to post-surgical adhesive disease, complicated by pertinent comorbidities including history of dementia now found to have widespread bony metastatic disease.   - Discussed role for NGT in these cases with the patient's daughter. At this time, as she is without emesis and resting comfortably, we will hold off on this. Patient's daughter understands the risk of aspiration should she vomit. I imagine maintaining a NGT will be challenging, but we will pursue this should her symptoms return  - Recommend remain NPO   - IVF Resuscitation   - Pain control prn; antiemetics prn  - Monitor abdominal examination; I would recommend serial KUBs given the lack of reliable subjective data we will be able to obtain from the patient.    - No surgical intervention; Patient's daughter is not interested in pursuing any surgical intervention   - Further management per primary service; we will follow   All of the above findings and recommendations were discussed with the patient's daughter, and all of the patient's daughter's questions were answered to her expressed satisfaction.  Thank you for the opportunity to participate in this patient's care.   -- Edison Simon, PA-C [facility] 10/08/2019, 8:51 AM [phone] M-F: 7am - 4pm

## 2019-10-05 NOTE — ED Notes (Signed)
Currently unable to find bladder scanner as checked briefs and purewick canister and no urine noted. Notified floor nurse of need for 2nd bladder scan.

## 2019-10-05 NOTE — ED Notes (Signed)
Family remains with pt. Denies any needs currently.

## 2019-10-05 NOTE — ED Notes (Signed)
Wells Guiles, RN attempted to I&O pt with verbal from provider. No urine was obtained. Provider notified. Wells Guiles, RN stated that when pt was first brought back she was saturated in urine and they changed here. ER Provider notified. Pt back in bed. Pulse ox and cardiac monitor on. Pure wick in place.

## 2019-10-05 NOTE — ED Notes (Signed)
Pt resting in bed. Daughter remains at bedside.

## 2019-10-05 NOTE — ED Notes (Signed)
Family member remains with pt. Denies any needs. Pt resting calmly in bed. Asleep but easily woken. Bed locked low. Rails up. Call bell within reach.

## 2019-10-05 NOTE — ED Notes (Signed)
Pt asleep. Will bladder scan soon as no urine noted in canister on wall since purewick has been in place.

## 2019-10-05 NOTE — ED Notes (Signed)
Delay on in-pt room assignment explained to family. Understanding.

## 2019-10-05 NOTE — ED Notes (Signed)
Pt noted to have a superficial abrasion to the vaginal area. MD notified. No new orders.

## 2019-10-05 NOTE — ED Notes (Addendum)
Family remains at bedside with pt; watching tv. Pt snoring. BP cuff placed on L arm. Previous bag of fluids continues to run. Will switch to new bag/fluid once current one completed.

## 2019-10-05 NOTE — ED Notes (Signed)
Called floor and left name and ascom number. Floor RN to call this RN back in about 54mins.

## 2019-10-05 NOTE — ED Provider Notes (Addendum)
-----------------------------------------   7:08 AM on 09/17/2019 -----------------------------------------  Blood pressure (!) 138/68, pulse 90, temperature 98.9 F (37.2 C), temperature source Oral, resp. rate 16, height 5\' 8"  (1.727 m), weight 64 kg, SpO2 100 %.  Assuming care from Dr. Owens Shark.  In short, Katelyn [name] is a 86 y.o. female with a chief complaint of Abdominal Pain, Emesis, and Loss of Consciousness .  Refer to the original H&P for additional details.  The current plan of care is to follow-up CT results, anticipate admission for SBO.  ----------------------------------------- 7:38 AM on 09/10/2019 -----------------------------------------  CT scan is consistent with small bowel obstruction, also concerning for widespread bony metastatic disease with unclear primary.  Reviewing patient's chart, she does not appear to have any history of cancer.  Case discussed with hospitalist for admission and patient's sister updated, I was unable to reach either of her daughter's by phone.   [name], MD 10/08/2019 [phone]

## 2019-10-05 NOTE — ED Notes (Signed)
Pt in room. Pt on cardiac, bp and pulse ox monitor. Pt is not answering questions, but will move in bed.

## 2019-10-05 NOTE — ED Notes (Addendum)
Max of 289cc urine noted on bladder scan. [place] notified.

## 2019-10-05 NOTE — H&P (Signed)
History and Physical    Vessie Olmsted Baugh SWF:[id number] DOB: 02-26-1924 DOA: 09/30/2019  PCP: Abelardo Diesel, NP (Inactive)   Patient coming from: Home  I have personally briefly reviewed patient's old medical records in [place]  Chief Complaint: Abdominal Pain Most of the history is obtained from the ER notes and her daughter at the bedside.  Patient is unable to provide any history  HPI: Lluvia Gwynne Bruna is a 86 y.o. female with medical history significant for dementia, chronic kidney disease stage III, peripheral vascular disease, A. fib and CVA who presented to the ER via EMS for evaluation of abdominal pain.  Pain is mostly in the periumbilical area and started after patient had a large meal.  I am unable to get any further history as to the character of the pain but patient moans when you palpate her abdomen especially around the periumbilical area.  I am unable to do a review of systems on this patient as she does not respond to any verbal stimuli. Her labs are within normal limits CT scan of abdomen and pelvis shows a small bowel obstruction with transition zone in the distal ileal region.  Rectum and distal sigmoid colon distended by stool.  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 Per patient's daughter she has had a hysterectomy in the past and she is unsure of any other prior surgeries. Discussed with patient's daughter the CT scan findings of bony metastatic disease and patient does not have a known history of any primary malignancy.  ED Course: Patient is a 86 year old female who presents to the emergency room via EMS for evaluation of abdominal pain.  Imaging is suggestive of a small bowel obstruction and patient is also noted to have widespread bony metastasis.  She does not have a known primary malignancy.  Review of Systems: As per HPI otherwise 10 point review of systems negative.    Past Medical History:  Diagnosis Date  . Abdominal pain    . Anxiety   . Atherosclerosis   . Chronic kidney disease    stage 3  . Colon polyps   . Constipation   . Cough with expectoration   . Dependent edema bilateral legs  . Gait abnormality   . Gout   . Hearing loss   . Hearing loss   . Hemorrhoids   . Hypertension   . Incontinence   . Osteoarthritis   . Osteoarthritis   . Osteoarthritis   . Paget's disease of bone   . Peripheral vascular disease ([place])   . Pneumonia   . Proteinuria   . Stroke [facility])     Past Surgical History:  Procedure Laterality Date  . PARTIAL HYSTERECTOMY    . PERIPHERAL VASCULAR CATHETERIZATION N/A 01/13/2015   Procedure: Abdominal Aortogram w/Lower Extremity;  Surgeon: Algernon Huxley, MD;  Location: [facility];  Service: Cardiovascular;  Laterality: N/A;  . PERIPHERAL VASCULAR CATHETERIZATION  01/13/2015   Procedure: Lower Extremity Intervention;  Surgeon: Algernon Huxley, MD;  Location: [place] [facility];  Service: Cardiovascular;;     reports that she has never smoked. She has never used smokeless tobacco. She reports that she does not drink alcohol and does not use drugs.  Allergies  Allergen Reactions  . Bee Venom Anaphylaxis    Family History  Problem Relation Age of Onset  . Diabetes Mother   . Colon cancer Father   . Cancer Sister   . Cancer Sister   . Cancer Sister  Prior to Admission medications   Medication Sig Start Date End Date Taking? Authorizing Provider  acetaminophen (TYLENOL) 325 MG tablet Take 2 tablets (650 mg total) by mouth every 6 (six) hours as needed for mild pain (or Fever >/= 101). Patient not taking: Reported on 04/21/2018 01/29/17   Gladstone Lighter, MD  alum & mag hydroxide-simeth (MAALOX PLUS) 400-400-40 MG/5ML suspension Take 5 mLs by mouth every 4 (four) hours as needed for indigestion.    [provider]  amLODipine (NORVASC) 10 MG tablet Take 1 tablet (10 mg total) by mouth daily. 06/03/19   Nolberto Hanlon, MD  apixaban (ELIQUIS) 2.5 MG TABS  tablet Take 1 tablet (2.5 mg total) by mouth 2 (two) times daily. 01/13/15   Algernon Huxley, MD  aspirin EC 325 MG EC tablet Take 1 tablet (325 mg total) by mouth daily. 06/03/19   Nolberto Hanlon, MD  atorvastatin (LIPITOR) 40 MG tablet Take 1 tablet (40 mg total) by mouth daily at 6 PM. 01/29/17   Gladstone Lighter, MD  atropine 1 % ophthalmic solution Place 2 drops under the tongue every hour as needed (excessive secretions).    [provider]  donepezil (ARICEPT) 10 MG tablet Take 1 tablet (10 mg total) by mouth daily. 04/23/18   Henreitta Leber, MD  famotidine (PEPCID) 20 MG tablet Take 1 tablet (20 mg total) by mouth daily. 06/03/19   Nolberto Hanlon, MD  fluticasone (FLONASE) 50 MCG/ACT nasal spray Place 2 sprays into both nostrils daily. 09/03/14   Glean Hess, MD  guaiFENesin-dextromethorphan (ROBITUSSIN DM) 100-10 MG/5ML syrup Take 5 mLs by mouth every 6 (six) hours as needed for cough. 04/23/18   Henreitta Leber, MD  hydrocortisone (ANUSOL-HC) 2.5 % rectal cream Place 1 application rectally 2 (two) times daily as needed for hemorrhoids.    [provider]  LORazepam (ATIVAN) 0.5 MG tablet Take 0.5 mg by mouth 3 (three) times daily.     [provider]  losartan (COZAAR) 50 MG tablet Take 50 mg by mouth daily.    [provider]  methimazole (TAPAZOLE) 5 MG tablet Take 2.5 mg by mouth 2 (two) times daily.     [provider]  metoprolol succinate (TOPROL-XL) 25 MG 24 hr tablet Take 25 mg by mouth daily.    [provider]  ondansetron (ZOFRAN-ODT) 4 MG disintegrating tablet Take 4 mg by mouth every 4 (four) hours as needed for nausea or vomiting.    [provider]  polyethylene glycol (MIRALAX / GLYCOLAX) packet Take 17 g by mouth daily.    [provider]  sennosides-docusate sodium (SENOKOT-S) 8.6-50 MG tablet Take 1 tablet by mouth 2 (two) times daily as needed for constipation. 04/23/18   Henreitta Leber, MD  Vitamins  A & D (VITAMIN A & D) ointment Apply 1 application topically 2 (two) times daily as needed for dry skin (itchy skin).    [provider]  Zinc Oxide 13 % CREA Place 1 application rectally 4 (four) times daily as needed (painful hemorrhoids).    [provider]    Physical Exam: Vitals:   09/23/2019 0109 09/25/2019 0400 09/16/2019 0430 09/18/2019 0617  BP: (!) 125/56 128/69 (!) 134/65 (!) 138/68  Pulse: 71 84  90  Resp: 18  23 16   Temp: 98.9 F (37.2 C)     TempSrc: Oral     SpO2: 96% 92%  100%  Weight:      Height:  Vitals:   10/01/2019 0109 10/04/2019 0400 09/20/2019 0430 09/25/2019 0617  BP: (!) 125/56 128/69 (!) 134/65 (!) 138/68  Pulse: 71 84  90  Resp: 18  23 16   Temp: 98.9 F (37.2 C)     TempSrc: Oral     SpO2: 96% 92%  100%  Weight:      Height:        Constitutional: Lethargic Eyes: PERRL, lids and conjunctivae normal ENMT: Mucous membranes are dry.  Neck: normal, supple, no masses, no thyromegaly Respiratory: clear to auscultation bilaterally, no wheezing, no crackles. Normal respiratory effort. No accessory muscle use.  Cardiovascular: Regular rate and rhythm, no murmurs / rubs / gallops. No extremity edema. 2+ pedal pulses. No carotid bruits.  Abdomen: Periumbilical tenderness, no masses palpated. No hepatosplenomegaly. Bowel sounds positive.  Musculoskeletal: no clubbing / cyanosis. No joint deformity upper and lower extremities.  Skin: no rashes, lesions, ulcers.  Neurologic: Unable to assess Psychiatric: Normal mood and affect.   Labs on Admission: I have personally reviewed following labs and imaging studies  CBC: Recent Labs  Lab 10/04/19 1705  WBC 9.1  HGB 11.7*  HCT 36.2  MCV 97.8  PLT 161   Basic Metabolic Panel: Recent Labs  Lab 10/04/19 1705  NA 136  K 4.5  CL 102  CO2 24  GLUCOSE 143*  BUN 22  CREATININE 1.08*  CALCIUM 9.4   GFR: Estimated Creatinine Clearance: 32.8 mL/min (A) (by C-G formula based on SCr of 1.08  mg/dL (H)). Liver Function Tests: Recent Labs  Lab 10/04/19 1705  AST 14*  ALT 8  ALKPHOS 62  BILITOT 0.7  PROT 7.3  ALBUMIN 4.0   Recent Labs  Lab 10/04/19 1705  LIPASE 17   No results for input(s): AMMONIA in the last 168 hours. Coagulation Profile: No results for input(s): INR, PROTIME in the last 168 hours. Cardiac Enzymes: No results for input(s): CKTOTAL, CKMB, CKMBINDEX, TROPONINI in the last 168 hours. BNP (last 3 results) No results for input(s): PROBNP in the last 8760 hours. HbA1C: No results for input(s): HGBA1C in the last 72 hours. CBG: No results for input(s): GLUCAP in the last 168 hours. Lipid Profile: No results for input(s): CHOL, HDL, LDLCALC, TRIG, CHOLHDL, LDLDIRECT in the last 72 hours. Thyroid Function Tests: No results for input(s): TSH, T4TOTAL, FREET4, T3FREE, THYROIDAB in the last 72 hours. Anemia Panel: No results for input(s): VITAMINB12, FOLATE, FERRITIN, TIBC, IRON, RETICCTPCT in the last 72 hours. Urine analysis:    Component Value Date/Time   COLORURINE YELLOW (A) 05/31/2019 0655   APPEARANCEUR CLEAR (A) 05/31/2019 0655   APPEARANCEUR Clear 06/06/2014 1015   LABSPEC 1.017 05/31/2019 0655   LABSPEC 1.017 06/06/2014 1015   PHURINE 6.0 05/31/2019 0655   GLUCOSEU NEGATIVE 05/31/2019 0655   GLUCOSEU Negative 06/06/2014 1015   HGBUR LARGE (A) 05/31/2019 0655   BILIRUBINUR NEGATIVE 05/31/2019 0655   BILIRUBINUR neg 10/20/2014 1001   BILIRUBINUR Negative 06/06/2014 1015   KETONESUR NEGATIVE 05/31/2019 0655   PROTEINUR 30 (A) 05/31/2019 0655   UROBILINOGEN 0.2 10/20/2014 1001   NITRITE NEGATIVE 05/31/2019 0655   LEUKOCYTESUR NEGATIVE 05/31/2019 0655   LEUKOCYTESUR Negative 06/06/2014 1015    Radiological Exams on Admission: CT ABDOMEN PELVIS W CONTRAST  Result Date: 09/24/2019 CLINICAL DATA:  Abdominal pain EXAM: CT ABDOMEN AND PELVIS WITH CONTRAST TECHNIQUE: Multidetector CT imaging of the abdomen and pelvis was performed using the  standard protocol following bolus administration of intravenous contrast. CONTRAST:  137mL OMNIPAQUE IOHEXOL 300  MG/ML  SOLN COMPARISON:  December 20, 2017 FINDINGS: Lower chest: There is bibasilar atelectatic change. There is lower lobe bronchiectatic change. No lung base consolidation noted. There is a pericardial effusion, incompletely visualized. Hepatobiliary: There is a degree of hepatic steatosis. No focal liver lesions are evident. There is cholelithiasis with an apparent gallstone measuring 2.7 cm. Gallbladder wall does not appear overtly thickened. There is no biliary duct dilatation. Pancreas: No pancreatic mass or inflammatory focus. Spleen: No splenic lesions are evident. Adrenals/Urinary Tract: Adrenals bilaterally appear normal. There is scarring in each kidney. There are cysts in the left kidney, largest measuring 1.2 x 1.2 cm. No appreciable hydronephrosis on either side. There is a calculus in the lower pole of the right kidney measuring 0.9 x 0.9 cm. There is a 3 mm calculus in the posterior mid left kidney. No ureteral calculus is appreciable on either side. Urinary bladder is midline with wall thickness within normal limits. Stomach/Bowel: Distal sigmoid colon and rectum are distended by stool. There is no appreciable rectal wall thickening. There is small bowel dilatation to the level of the distal ileum consistent with a degree of bowel obstruction. No free air or portal venous air is appreciable on this study. Vascular/Lymphatic: There is no abdominal aortic aneurysm. There is aortic and iliac artery atherosclerosis as well as atherosclerotic calcification in more distal pelvic arterial vessels. Major venous structures appear patent. No evident adenopathy in the abdomen or pelvis. Reproductive: Uterus absent.  No evident pelvic mass. Other: No periappendiceal region inflammation evident. No abscess evident in the abdomen or pelvis. There is mild ascites in the upper abdomen. Musculoskeletal:  There are mixed blastic and lytic bone lesions throughout the pelvis consistent with metastatic disease of uncertain etiology. No intramuscular lesions are appreciable. IMPRESSION: 1.  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 2. Small bowel obstruction with transition zone in the distal ileal region. 3. Rectum and distal sigmoid colon distended by stool without appreciable wall thickening in these areas. 4.  Mild ascites. 5.  Cholelithiasis. 6. Calculi in each kidney, nonobstructing. Scarring in each kidney. No hydronephrosis on either side. No ureteral calculi. 7.  Aortic Atherosclerosis (ICD10-I70.0). 8.  Uterus and gallbladder absent. 9.  Incomplete visualization of pericardial effusion. Electronically Signed   By: Lowella Grip III M.D.   On: 09/17/2019 07:05   DG Chest Port 1 View  Result Date: 09/18/2019 CLINICAL DATA:  Abdominal pain EXAM: PORTABLE CHEST 1 VIEW COMPARISON:  05/31/2019 FINDINGS: Cardiomegaly. Chronic upper mediastinal widening with rightward tracheal deviation from a large nodular thyroid gland with mediastinal extension. Upper normal heart size. There is no edema, consolidation, effusion, or pneumothorax. There is a gas distended structure in the epigastric region which could be stomach or transverse colon. Accounting for the gastric bubble, no suspected pneumoperitoneum. IMPRESSION: No acute finding when compared to prior. Electronically Signed   By: Monte Fantasia M.D.   On: 10/08/2019 04:47    EKG: Independently reviewed.  Sinus rhythm Incomplete right bundle branch block Left axis deviation LVH  Assessment/Plan Principal Problem:   SBO (small bowel obstruction) (HCC) Active Problems:   Essential (primary) hypertension   PAD (peripheral artery disease) (HCC)   Altered mental status   CKD (chronic kidney disease) stage 4, GFR 15-29 ml/min (HCC)   AF (paroxysmal atrial fibrillation) (HCC)   Bony metastasis (HCC)    Small bowel obstruction Most  likely secondary to adhesions Patient presents for evaluation of abdominal pain mostly in the periumbilical area CT scan of  abdomen and pelvis shows small bowel obstruction with transition zone in the distal ileal region. Will manage patient conservatively for now Keep patient n.p.o., pain control and IV fluid hydration If she has emesis we will insert a nasogastric tube Place patient on IV Protonix We will request surgical consult   Paroxysmal atrial fibrillation Patient is rate controlled Hold apixaban for now Hold beta-blockers for now   Hypertension with complications of chronic kidney disease stage III Blood pressure stable We will place patient on IV hydralazine for systolic blood pressure greater than 168mmHg    Bony metastasis Incidental finding on CT scan of abdomen and pelvis Patient does not have a known primary Discussed CT scan findings with patient's daughter Bonnita Nasuti at the bedside who does not want any further work-up/investigation at this time since her mother is very frail and may not be able to tolerate any treatments for cancer   DVT prophylaxis: Lovenox Code Status: Full code Family Communication: Greater than 50% of time was spent discussing plan of care with patient's daughter at the bedside.  All questions and concerns have been addressed. Disposition Plan: Back to previous home environment Consults called: Surgery    Cylie Dor MD [facility]     10/10/2019, 9:01 AM

## 2019-10-06 ENCOUNTER — Inpatient Hospital Stay: Payer: Medicare (Managed Care)

## 2019-10-06 DIAGNOSIS — N3001 Acute cystitis with hematuria: Secondary | ICD-10-CM

## 2019-10-06 DIAGNOSIS — G9341 Metabolic encephalopathy: Secondary | ICD-10-CM

## 2019-10-06 DIAGNOSIS — C7951 Secondary malignant neoplasm of bone: Secondary | ICD-10-CM

## 2019-10-06 LAB — URINALYSIS, COMPLETE (UACMP) WITH MICROSCOPIC
Bilirubin Urine: NEGATIVE
Glucose, UA: NEGATIVE mg/dL
Ketones, ur: NEGATIVE mg/dL
Nitrite: NEGATIVE
Protein, ur: NEGATIVE mg/dL
RBC / HPF: >50 RBC/hpf — ABNORMAL HIGH (ref 0–5)
Specific Gravity, Urine: >1.046 — ABNORMAL HIGH (ref 1.005–1.030)
pH: 5 (ref 5.0–8.0)

## 2019-10-06 LAB — CBC
HCT: 34.7 % — ABNORMAL LOW (ref 36.0–46.0)
Hemoglobin: 11.5 g/dL — ABNORMAL LOW (ref 12.0–15.0)
MCH: 32.2 pg (ref 26.0–34.0)
MCHC: 33.1 g/dL (ref 30.0–36.0)
MCV: 97.2 fL (ref 80.0–100.0)
Platelets: 275 10*3/uL (ref 150–400)
RBC: 3.57 MIL/uL — ABNORMAL LOW (ref 3.87–5.11)
RDW: 14.7 % (ref 11.5–15.5)
WBC: 5 10*3/uL (ref 4.0–10.5)
nRBC: 0 % (ref 0.0–0.2)

## 2019-10-06 LAB — BASIC METABOLIC PANEL
Anion gap: 13 (ref 5–15)
BUN: 41 mg/dL — ABNORMAL HIGH (ref 8–23)
CO2: 24 mmol/L (ref 22–32)
Calcium: 8.6 mg/dL — ABNORMAL LOW (ref 8.9–10.3)
Chloride: 102 mmol/L (ref 98–111)
Creatinine, Ser: 1.68 mg/dL — ABNORMAL HIGH (ref 0.44–1.00)
GFR calc Af Amer: 30 mL/min — ABNORMAL LOW (ref >60)
GFR calc non Af Amer: 26 mL/min — ABNORMAL LOW (ref >60)
Glucose, Bld: 119 mg/dL — ABNORMAL HIGH (ref 70–99)
Potassium: 5 mmol/L (ref 3.5–5.1)
Sodium: 139 mmol/L (ref 135–145)

## 2019-10-06 MED ORDER — FLEET ENEMA 7-19 GM/118ML RE ENEM: 1.0000 | ENEMA | Freq: Every day | RECTAL | Status: DC | PRN

## 2019-10-06 MED ORDER — SODIUM CHLORIDE 0.9 % IV SOLN
1.0000 g | INTRAVENOUS | Status: DC
  Administered 2019-10-06 – 2019-10-07 (×2): 1 g via INTRAVENOUS
  Filled 2019-10-06: qty 1
  Filled 2019-10-06: qty 10

## 2019-10-06 MED ORDER — ENOXAPARIN SODIUM 30 MG/0.3ML ~~LOC~~ SOLN
30.0000 mg | SUBCUTANEOUS | Status: DC
  Administered 2019-10-06 – 2019-10-07 (×2): 30 mg via SUBCUTANEOUS
  Filled 2019-10-06 (×2): qty 0.3

## 2019-10-06 MED ORDER — SODIUM CHLORIDE 0.9 % IV SOLN
INTRAVENOUS | Status: DC | PRN
  Administered 2019-10-06: 250 mL via INTRAVENOUS

## 2019-10-06 MED ORDER — MORPHINE SULFATE (PF) 2 MG/ML IV SOLN
1.0000 mg | INTRAVENOUS | Status: DC | PRN
  Administered 2019-10-06: 1 mg via INTRAVENOUS
  Filled 2019-10-06: qty 1

## 2019-10-06 MED ORDER — BISACODYL 10 MG RE SUPP
10.0000 mg | Freq: Every day | RECTAL | Status: DC
  Administered 2019-10-06: 10 mg via RECTAL
  Filled 2019-10-06 (×2): qty 1

## 2019-10-06 MED ORDER — IPRATROPIUM-ALBUTEROL 0.5-2.5 (3) MG/3ML IN SOLN
3.0000 mL | Freq: Four times a day (QID) | RESPIRATORY_TRACT | Status: DC
  Administered 2019-10-06 – 2019-10-07 (×4): 3 mL via RESPIRATORY_TRACT
  Filled 2019-10-06 (×4): qty 3

## 2019-10-06 NOTE — Progress Notes (Signed)
Attempted NG placement. Patient would not follow commands and resisted. Unable to place NGT at this time. Will alert MD.   Fuller Mandril, RN

## 2019-10-06 NOTE — Progress Notes (Signed)
Patient ID: Katelyn [name], female   DOB: 1923/04/17, 86 y.o.   MRN: [id number] [facility] PROGRESS NOTE  Katelyn [name] BDZ:[id number] DOB: May 19, 1923 DOA: 10/10/2019 PCP: Abelardo Diesel, NP (Inactive)  HPI/Subjective: Patient admitted with small bowel obstruction.  Patient just looked at me when I came into the room.  She was unable to talk with me.  When I pressed her abdomen she grabbed my hand and grimaced.  Objective: Vitals:   10/06/19 0002 10/06/19 0503  BP: (!) 113/31 (!) 139/48  Pulse: 90 97  Resp: 18 18  Temp: 97.9 F (36.6 C) 98 F (36.7 C)  SpO2: 99% 100%    Intake/Output Summary (Last 24 hours) at 10/06/2019 1047 Last data filed at 10/06/2019 0500 Gross per 24 hour  Intake 0 ml  Output 150 ml  Net -150 ml   Filed Weights   10/04/19 1651  Weight: 64 kg    ROS: Review of Systems  Unable to perform ROS: Acuity of condition   Exam: Physical Exam HENT:     Head: Normocephalic.     Nose: No mucosal edema.     Mouth/Throat:     Comments: Unable to look into mouth. Eyes:     General: Lids are normal.     Conjunctiva/sclera: Conjunctivae normal.     Pupils: Pupils are equal, round, and reactive to light.  Cardiovascular:     Rate and Rhythm: Normal rate and regular rhythm.     Heart sounds: Normal heart sounds, S1 normal and S2 normal.  Pulmonary:     Breath sounds: No decreased breath sounds, wheezing, rhonchi or rales.  Abdominal:     General: Bowel sounds are decreased.     Palpations: Abdomen is soft.     Tenderness: There is generalized abdominal tenderness.  Musculoskeletal:     Right ankle: No swelling.     Left ankle: No swelling.  Skin:    General: Skin is warm.     Findings: No rash.  Neurological:     Mental Status: She is alert.     Comments: Moves her hands on her own.       Data Reviewed: Basic Metabolic Panel: Recent Labs  Lab 10/04/19 1705 10/06/19 0413  NA 136 139  K 4.5 5.0  CL 102 102  CO2 24 24   GLUCOSE 143* 119*  BUN 22 41*  CREATININE 1.08* 1.68*  CALCIUM 9.4 8.6*   Liver Function Tests: Recent Labs  Lab 10/04/19 1705  AST 14*  ALT 8  ALKPHOS 62  BILITOT 0.7  PROT 7.3  ALBUMIN 4.0   Recent Labs  Lab 10/04/19 1705  LIPASE 17   No results for input(s): AMMONIA in the last 168 hours. CBC: Recent Labs  Lab 10/04/19 1705 10/06/19 0413  WBC 9.1 5.0  HGB 11.7* 11.5*  HCT 36.2 34.7*  MCV 97.8 97.2  PLT 275 275   Cardiac Enzymes: No results for input(s): CKTOTAL, CKMB, CKMBINDEX, TROPONINI in the last 168 hours. BNP (last 3 results) No results for input(s): BNP in the last 8760 hours.  ProBNP (last 3 results) No results for input(s): PROBNP in the last 8760 hours.  CBG: No results for input(s): GLUCAP in the last 168 hours.  Recent Results (from the past 240 hour(s))  SARS Coronavirus 2 by RT PCR (hospital order, performed in [facility] hospital lab) Nasopharyngeal Nasopharyngeal Swab     Status: None   Collection Time: 09/23/2019 10:23 AM   Specimen: Nasopharyngeal Swab  Result Value Ref Range Status   SARS Coronavirus 2 NEGATIVE NEGATIVE Final    Comment: (NOTE) SARS-CoV-2 target nucleic acids are NOT DETECTED.  The SARS-CoV-2 RNA is generally detectable in upper and lower respiratory specimens during the acute phase of infection. The lowest concentration of SARS-CoV-2 viral copies this assay can detect is 250 copies / mL. A negative result does not preclude SARS-CoV-2 infection and should not be used as the sole basis for treatment or other patient management decisions.  A negative result may occur with improper specimen collection / handling, submission of specimen other than nasopharyngeal swab, presence of viral mutation(s) within the areas targeted by this assay, and inadequate number of viral copies (<250 copies / mL). A negative result must be combined with clinical observations, patient history, and epidemiological information.  Fact Sheet  for Patients:   StrictlyIdeas.no  Fact Sheet for Healthcare Providers: BankingDealers.co.za  This test is not yet approved or  cleared by the [place] FDA and has been authorized for detection and/or diagnosis of SARS-CoV-2 by FDA under an Emergency Use Authorization (EUA).  This EUA will remain in effect (meaning this test can be used) for the duration of the COVID-19 declaration under Section 564(b)(1) of the Act, 21 U.S.C. section 360bbb-3(b)(1), unless the authorization is terminated or revoked sooner.  Performed at [facility], [address], [place], [place] [address]      Studies: CT HEAD WO CONTRAST  Result Date: 09/29/2019 CLINICAL DATA:  Mental status change. EXAM: CT HEAD WITHOUT CONTRAST TECHNIQUE: Contiguous axial images were obtained from the base of the skull through the vertex without intravenous contrast. COMPARISON:  05/31/2019 CT MRI head. FINDINGS: Brain: No acute infarct or intracranial hemorrhage. Sequela of remote infarcts involving the right corona radiata/basal ganglia and left parietooccipital region. Mild diffuse parenchymal volume loss with ex vacuo dilatation. Background scattered and confluent hypodense foci involving the periventricular and deep white matter nonspecific however commonly associated with chronic microvascular ischemic changes. No mass lesion. No midline shift or extra-axial fluid collection. Vascular: Residual intravenous contrast opacifies the major intracranial vessels. Bilateral carotid siphon atherosclerotic calcifications. Skull: Negative for fracture or focal lesion. Sinuses/Orbits: Normal orbits. Partial right frontal sinus/recess opacification with hyperostosis, unchanged. No mastoid effusion. Other: None. IMPRESSION: No acute intracranial process. Remote left parietooccipital and right corona radiata/basal ganglia insults. Mild cerebral atrophy and chronic microvascular ischemic  changes. Electronically Signed   By: Primitivo Gauze M.D.   On: 09/28/2019 09:30   CT ABDOMEN PELVIS W CONTRAST  Result Date: 09/20/2019 CLINICAL DATA:  Abdominal pain EXAM: CT ABDOMEN AND PELVIS WITH CONTRAST TECHNIQUE: Multidetector CT imaging of the abdomen and pelvis was performed using the standard protocol following bolus administration of intravenous contrast. CONTRAST:  147mL OMNIPAQUE IOHEXOL 300 MG/ML  SOLN COMPARISON:  December 20, 2017 FINDINGS: Lower chest: There is bibasilar atelectatic change. There is lower lobe bronchiectatic change. No lung base consolidation noted. There is a pericardial effusion, incompletely visualized. Hepatobiliary: There is a degree of hepatic steatosis. No focal liver lesions are evident. There is cholelithiasis with an apparent gallstone measuring 2.7 cm. Gallbladder wall does not appear overtly thickened. There is no biliary duct dilatation. Pancreas: No pancreatic mass or inflammatory focus. Spleen: No splenic lesions are evident. Adrenals/Urinary Tract: Adrenals bilaterally appear normal. There is scarring in each kidney. There are cysts in the left kidney, largest measuring 1.2 x 1.2 cm. No appreciable hydronephrosis on either side. There is a calculus in the lower pole of the right  kidney measuring 0.9 x 0.9 cm. There is a 3 mm calculus in the posterior mid left kidney. No ureteral calculus is appreciable on either side. Urinary bladder is midline with wall thickness within normal limits. Stomach/Bowel: Distal sigmoid colon and rectum are distended by stool. There is no appreciable rectal wall thickening. There is small bowel dilatation to the level of the distal ileum consistent with a degree of bowel obstruction. No free air or portal venous air is appreciable on this study. Vascular/Lymphatic: There is no abdominal aortic aneurysm. There is aortic and iliac artery atherosclerosis as well as atherosclerotic calcification in more distal pelvic arterial vessels.  Major venous structures appear patent. No evident adenopathy in the abdomen or pelvis. Reproductive: Uterus absent.  No evident pelvic mass. Other: No periappendiceal region inflammation evident. No abscess evident in the abdomen or pelvis. There is mild ascites in the upper abdomen. Musculoskeletal: There are mixed blastic and lytic bone lesions throughout the pelvis consistent with metastatic disease of uncertain etiology. No intramuscular lesions are appreciable. IMPRESSION: 1.  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 2. Small bowel obstruction with transition zone in the distal ileal region. 3. Rectum and distal sigmoid colon distended by stool without appreciable wall thickening in these areas. 4.  Mild ascites. 5.  Cholelithiasis. 6. Calculi in each kidney, nonobstructing. Scarring in each kidney. No hydronephrosis on either side. No ureteral calculi. 7.  Aortic Atherosclerosis (ICD10-I70.0). 8.  Uterus and gallbladder absent. 9.  Incomplete visualization of pericardial effusion. Electronically Signed   By: Lowella Grip III M.D.   On: 10/02/2019 07:05   DG Chest Port 1 View  Result Date: 09/12/2019 CLINICAL DATA:  Abdominal pain EXAM: PORTABLE CHEST 1 VIEW COMPARISON:  05/31/2019 FINDINGS: Cardiomegaly. Chronic upper mediastinal widening with rightward tracheal deviation from a large nodular thyroid gland with mediastinal extension. Upper normal heart size. There is no edema, consolidation, effusion, or pneumothorax. There is a gas distended structure in the epigastric region which could be stomach or transverse colon. Accounting for the gastric bubble, no suspected pneumoperitoneum. IMPRESSION: No acute finding when compared to prior. Electronically Signed   By: Monte Fantasia M.D.   On: 09/27/2019 04:47   DG Abd Portable 1V  Result Date: 10/06/2019 CLINICAL DATA:  Small-bowel obstruction. EXAM: PORTABLE ABDOMEN - 1 VIEW COMPARISON:  CT 10/01/2019. FINDINGS: Surgical clips  noted in the abdomen. Persistent distention of small bowel loops with a relative paucity of intra colonic gas again noted consistent with persistent small-bowel obstruction. No free air identified. Diffuse osteopenia. Degenerative changes scoliosis thoracic spine. Degenerative changes both hips. Multiple mixed bony lesions are again noted particularly in the pelvis consistent with a possibility of metastatic disease. These are best seen by prior CT contrast from prior CT noted the bladder. IMPRESSION: 1. Persistent distention of small bowel consistent with persistent small-bowel obstruction. No free air. 2. Multiple makes bony lesions are again noted particularly in the pelvis consistent possibility of metastatic disease. These are best seen by prior CT. Electronically Signed   By: Marcello Moores  Register   On: 10/06/2019 08:27    Scheduled Meds: . enoxaparin (LOVENOX) injection  30 mg Subcutaneous Q24H  . ipratropium-albuterol  3 mL Nebulization Q6H  . pantoprazole (PROTONIX) IV  40 mg Intravenous Q24H  . sodium chloride flush  3 mL Intravenous Once   Continuous Infusions: . sodium chloride 75 mL/hr at 10/08/2019 2226  . cefTRIAXone (ROCEPHIN)  IV      Assessment/Plan:  1. Small bowel obstruction.  Case discussed with general surgery and daughter on the phone.  They are not looking for a surgical option.  General surgery to speak with family about NG tube or not.  Currently patient not vomiting.  Continue IV fluid hydration and as needed nausea and pain medications. 2. Bony metastases seen on CT scan of the abdomen and pelvis.  Family not interested in doing further work-up for this and interested in pain control.  Continue morphine as needed.  We will get palliative care consultation.  Patient is a DNR. 3. Acute metabolic encephalopathy.  Normally the patient is able to hold a conversation but today just looking at me. 4. Acute cystitis.  Send off urine culture.  Start Rocephin 5. Paroxysmal atrial  fibrillation. oral meds currently on hold.  Patient does take Eliquis at home.  6. Chronic kidney disease stage IIIb borderline stage IV.  IV fluid hydration.     Code Status:     Code Status Orders  (From admission, onward)         Start     Ordered   09/12/2019 0802  Do not attempt resuscitation (DNR)  Continuous       Question Answer Comment  In the event of cardiac or respiratory ARREST Do not call a "code blue"   In the event of cardiac or respiratory ARREST Do not perform Intubation, CPR, defibrillation or ACLS   In the event of cardiac or respiratory ARREST Use medication by any route, position, wound care, and other measures to relive pain and suffering. May use oxygen, suction and manual treatment of airway obstruction as needed for comfort.   Comments Discussed code status with patient's daughter at the bedside. She wants her to be a DNR      09/26/2019 0802        Code Status History    Date Active Date Inactive Code Status Order ID Comments User Context   05/31/2019 0910 06/02/2019 1825 DNR [id number]  Collier Bullock, MD ED   04/21/2018 2102 04/23/2018 2013 DNR [id number]  Gladstone Lighter, MD Inpatient   01/27/2017 1143 01/29/2017 1603 DNR [id number]  Idelle Crouch, MD Inpatient   01/13/2015 1202 01/13/2015 1649 Full Code [id number]  Algernon Huxley, MD Inpatient   12/23/2014 1412 12/27/2014 2142 Full Code [id number]  Aldean Jewett, MD Inpatient   Advance Care Planning Activity    Advance Directive Documentation     Most Recent Value  Type of Advance Directive Out of facility DNR (pink MOST or yellow form)  Pre-existing out of facility DNR order (yellow form or pink MOST form) Yellow form placed in chart (order not valid for inpatient use)  "MOST" Form in Place? --     Family Communication: Spoke with daughter on the phone and outside the room. Disposition Plan: Status is: Inpatient  Dispo: The patient is from: Home              Anticipated d/c is to: Yet to be  determined              Anticipated d/c date is: Sometimes small bowel obstruction takes 5 days to improve              Patient currently being treated for small bowel obstruction, acute metabolic encephalopathy and UTI  Consultants:  General surgery  Palliative care  Antibiotics:  Rocephin  Time spent: 28 minutes  [place]

## 2019-10-06 NOTE — Progress Notes (Signed)
[facility] SURGICAL PROGRESS NOTE (cpt [id number])  Hospital Day(s): 1.   Interval History:  History limited secondary to dementia Patient seen and examined no acute events or new complaints overnight.  No leukocytosis on CBC Renal function slightly worse, sCr - 1.68 (baseline is 1.1 - 1.2), UO low, suspected some degree of volume depletion  No significant electrolyte derangements She has remained NPO  Review of Systems:  Unable to reliably preform secondary to dementia   Vital signs in last 24 hours: [min-max] current  Temp:  [97.9 F (36.6 C)-99.3 F (37.4 C)] 98 F (36.7 C) (07/27 0503) Pulse Rate:  [83-98] 97 (07/27 0503) Resp:  [13-24] 18 (07/27 0503) BP: (95-139)/(31-71) 139/48 (07/27 0503) SpO2:  [94 %-100 %] 100 % (07/27 0503)     Height: 5\' 8"  (172.7 cm) Weight: 64 kg BMI (Calculated): 21.46   Intake/Output last 2 shifts:  07/26 0701 - 07/27 0700 In: 0  Out: 150 [Urine:150]   Physical Exam:  Constitutional: resting in bed, does not arouse to verbal stimuli, groans with painful stimuli, no family at bedsdie  HENT: normocephalic without obvious abnormality  Respiratory: breathing non-labored at rest although she does have upper airway secretions Cardiovascular: regular rate and sinus rhythm  Gastrointestinal: Soft, unable to reliably assess tenderness however she groans with palpation to periumbilical region, and non-distended but tympanic  Musculoskeletal: no edema or wounds, motor and sensation grossly intact, NT    Labs:  CBC Latest Ref Rng & Units 10/06/2019 10/04/2019 06/02/2019  WBC 4.0 - 10.5 K/uL 5.0 9.1 6.5  Hemoglobin 12.0 - 15.0 g/dL 11.5(L) 11.7(L) 11.0(L)  Hematocrit 36 - 46 % 34.7(L) 36.2 34.9(L)  Platelets 150 - 400 K/uL 275 275 203   CMP Latest Ref Rng & Units 10/06/2019 10/04/2019 05/31/2019  Glucose 70 - 99 mg/dL 119(H) 143(H) 176(H)  BUN 8 - 23 mg/dL 41(H) 22 27(H)  Creatinine 0.44 - 1.00 mg/dL 1.68(H) 1.08(H) 1.25(H)  Sodium 135 -  145 mmol/L 139 136 140  Potassium 3.5 - 5.1 mmol/L 5.0 4.5 3.8  Chloride 98 - 111 mmol/L 102 102 106  CO2 22 - 32 mmol/L 24 24 24   Calcium 8.9 - 10.3 mg/dL 8.6(L) 9.4 9.1  Total Protein 6.5 - 8.1 g/dL - 7.3 7.2  Total Bilirubin 0.3 - 1.2 mg/dL - 0.7 0.6  Alkaline Phos 38 - 126 U/L - 62 57  AST 15 - 41 U/L - 14(L) 21  ALT 0 - 44 U/L - 8 8     Imaging studies:   KUB (09/26/2019) personally reviewed showing persistent small bowel dilation and gastric dilation without pneumoperitoneum, and radiologist report reviewed:  IMPRESSION: 1. Persistent distention of small bowel consistent with persistent small-bowel obstruction. No free air.  2. Multiple makes bony lesions are again noted particularly in the pelvis consistent possibility of metastatic disease. These are best seen by prior CT.    Assessment/Plan: (ICD-10's: K49.609) 86 y.o. very frail female with persistent small bowel obstruction secondary to post-surgical adhesive disease, complicated by pertinent comorbidities including history of dementia now found to have widespread bony metastatic disease.   -Continue NPO  - I do feel it may be in her best interest for NGT placement to limit risk of aspiration given continued gastric distension on KUB; however, I will leave this up to family preference  - We should consider enema +/- disimpaction given significant stool burden on CT  - Continue IVF Resuscitation                  -  Pain control prn; antiemetics prn  - Monitor electrolytes, renal function --> morning labs              - Monitor abdominal examination; I would recommend serial KUBs given the lack of reliable subjective data we will be able to obtain from the patient.               - No surgical intervention; Patient's daughter is not interested in pursuing any surgical intervention regardless              - Further management per primary service; we will follow   All of the above findings and recommendations were  discussed with the medical team  -- Edison Simon, PA-C [facility] 10/06/2019, 7:49 AM [phone] M-F: 7am - 4pm

## 2019-10-07 ENCOUNTER — Inpatient Hospital Stay: Payer: Medicare (Managed Care)

## 2019-10-07 ENCOUNTER — Encounter: Payer: Self-pay | Admitting: Internal Medicine

## 2019-10-07 DIAGNOSIS — Z515 Encounter for palliative care: Secondary | ICD-10-CM

## 2019-10-07 DIAGNOSIS — Z7189 Other specified counseling: Secondary | ICD-10-CM

## 2019-10-07 DIAGNOSIS — Z66 Do not resuscitate: Secondary | ICD-10-CM

## 2019-10-07 LAB — BASIC METABOLIC PANEL
Anion gap: 13 (ref 5–15)
BUN: 52 mg/dL — ABNORMAL HIGH (ref 8–23)
CO2: 24 mmol/L (ref 22–32)
Calcium: 8.2 mg/dL — ABNORMAL LOW (ref 8.9–10.3)
Chloride: 102 mmol/L (ref 98–111)
Creatinine, Ser: 1.73 mg/dL — ABNORMAL HIGH (ref 0.44–1.00)
GFR calc Af Amer: 29 mL/min — ABNORMAL LOW (ref >60)
GFR calc non Af Amer: 25 mL/min — ABNORMAL LOW (ref >60)
Glucose, Bld: 124 mg/dL — ABNORMAL HIGH (ref 70–99)
Potassium: 4 mmol/L (ref 3.5–5.1)
Sodium: 139 mmol/L (ref 135–145)

## 2019-10-07 LAB — URINE CULTURE

## 2019-10-07 LAB — MAGNESIUM: Magnesium: 2.3 mg/dL (ref 1.7–2.4)

## 2019-10-07 MED ORDER — LORAZEPAM 2 MG/ML IJ SOLN: 1.0000 mg | INTRAMUSCULAR | Status: DC | PRN

## 2019-10-07 MED ORDER — MORPHINE SULFATE (PF) 2 MG/ML IV SOLN
1.0000 mg | INTRAVENOUS | Status: DC | PRN
  Administered 2019-10-07 (×3): 1 mg via INTRAVENOUS
  Filled 2019-10-07 (×3): qty 1

## 2019-10-07 MED ORDER — GLYCOPYRROLATE 1 MG PO TABS
1.0000 mg | ORAL_TABLET | ORAL | Status: DC | PRN
  Filled 2019-10-07: qty 1

## 2019-10-07 MED ORDER — BIOTENE DRY MOUTH MT LIQD: 15.0000 mL | OROMUCOSAL | Status: DC | PRN

## 2019-10-07 MED ORDER — LORAZEPAM 2 MG/ML PO CONC
1.0000 mg | ORAL | Status: DC | PRN
  Filled 2019-10-07: qty 0.5

## 2019-10-07 MED ORDER — MORPHINE SULFATE (PF) 2 MG/ML IV SOLN
1.0000 mg | INTRAVENOUS | Status: DC | PRN
  Administered 2019-10-07: 1 mg via INTRAVENOUS
  Filled 2019-10-07: qty 1

## 2019-10-07 MED ORDER — GLYCOPYRROLATE 0.2 MG/ML IJ SOLN
0.2000 mg | INTRAMUSCULAR | Status: DC | PRN
  Filled 2019-10-07: qty 1

## 2019-10-07 MED ORDER — HALOPERIDOL LACTATE 2 MG/ML PO CONC
0.5000 mg | ORAL | Status: DC | PRN
  Filled 2019-10-07: qty 0.3

## 2019-10-07 MED ORDER — POLYVINYL ALCOHOL 1.4 % OP SOLN
1.0000 [drp] | Freq: Four times a day (QID) | OPHTHALMIC | Status: DC | PRN
  Filled 2019-10-07: qty 15

## 2019-10-07 MED ORDER — MORPHINE SULFATE (PF) 2 MG/ML IV SOLN: 2.0000 mg | INTRAVENOUS | Status: DC | PRN

## 2019-10-07 MED ORDER — HALOPERIDOL 0.5 MG PO TABS
0.5000 mg | ORAL_TABLET | ORAL | Status: DC | PRN
  Filled 2019-10-07: qty 1

## 2019-10-07 MED ORDER — GLYCOPYRROLATE 0.2 MG/ML IJ SOLN
0.2000 mg | INTRAMUSCULAR | Status: DC | PRN
  Administered 2019-10-07 (×2): 0.2 mg via INTRAVENOUS
  Filled 2019-10-07 (×5): qty 1

## 2019-10-07 MED ORDER — BOOST / RESOURCE BREEZE PO LIQD CUSTOM
1.0000 | ORAL | Status: AC
  Administered 2019-10-07: 1 via ORAL

## 2019-10-07 MED ORDER — LORAZEPAM 1 MG PO TABS: 1.0000 mg | ORAL_TABLET | ORAL | Status: DC | PRN

## 2019-10-07 MED ORDER — HALOPERIDOL LACTATE 5 MG/ML IJ SOLN: 0.5000 mg | INTRAMUSCULAR | Status: DC | PRN

## 2019-10-07 MED ORDER — MORPHINE SULFATE (PF) 2 MG/ML IV SOLN
2.0000 mg | INTRAVENOUS | Status: DC | PRN
  Administered 2019-10-07 – 2019-10-09 (×8): 2 mg via INTRAVENOUS
  Filled 2019-10-07 (×8): qty 1

## 2019-10-07 NOTE — Progress Notes (Signed)
[facility] PROGRESS NOTE (cpt [phone])  Hospital Day(s): 2.   Interval History:  History limited secondary to dementia Patient seen and examined no acute events or new complaints overnight Unable to have NGT placed yesterday secondary to inability to follow commands Renal function continues to worsens, sCr - 1.73, UO unmeasured No electrolyte derangement seen KUB pending  Review of Systems:  Unable to reliably preform secondary to dementia   Vital signs in last 24 hours: [min-max] current  Temp:  [97.8 F (36.6 C)-99.6 F (37.6 C)] 99.6 F (37.6 C) (07/28 0412) Pulse Rate:  [79-103] 103 (07/28 0412) Resp:  [16-20] 20 (07/28 0412) BP: (111-138)/(54-61) 111/54 (07/28 0412) SpO2:  [91 %-93 %] 93 % (07/28 0412)     Height: 5\' 8"  (172.7 cm) Weight: 64 kg BMI (Calculated): 21.46   Intake/Output last 2 shifts:  07/27 0701 - 07/28 0700 In: 1290.8 [I.V.:1256.1; IV Piggyback:34.7] Out: 0    Physical Exam:  Constitutional: resting in bed, does not arouse to verbal stimuli, daughter at bedside HENT: normocephalic without obvious abnormality  Respiratory: breathing non-labored at rest Cardiovascular: regular rate and sinus rhythm  Gastrointestinal: Soft, she does not appear to have any tenderness this morning, mild distension, tympanic, no evidence of peritonitis Musculoskeletal: no edema or wounds, motor and sensation grossly intact, NT    Labs:  CBC Latest Ref Rng & Units 10/06/2019 10/04/2019 06/02/2019  WBC 4.0 - 10.5 K/uL 5.0 9.1 6.5  Hemoglobin 12.0 - 15.0 g/dL 11.5(L) 11.7(L) 11.0(L)  Hematocrit 36 - 46 % 34.7(L) 36.2 34.9(L)  Platelets 150 - 400 K/uL 275 275 203   CMP Latest Ref Rng & Units 10/07/2019 10/06/2019 10/04/2019  Glucose 70 - 99 mg/dL 124(H) 119(H) 143(H)  BUN 8 - 23 mg/dL 52(H) 41(H) 22  Creatinine 0.44 - 1.00 mg/dL 1.73(H) 1.68(H) 1.08(H)  Sodium 135 - 145 mmol/L 139 139 136  Potassium 3.5 - 5.1 mmol/L 4.0 5.0 4.5  Chloride 98 - 111  mmol/L 102 102 102  CO2 22 - 32 mmol/L 24 24 24   Calcium 8.9 - 10.3 mg/dL 8.2(L) 8.6(L) 9.4  Total Protein 6.5 - 8.1 g/dL - - 7.3  Total Bilirubin 0.3 - 1.2 mg/dL - - 0.7  Alkaline Phos 38 - 126 U/L - - 62  AST 15 - 41 U/L - - 14(L)  ALT 0 - 44 U/L - - 8    Imaging studies:  KUB (10/07/2019) pending  Assessment/Plan: (ICD-10's: K45.609) 86 y.o. female with likely persistent small bowel obstruction secondary to post-surgical adhesive disease, complicated by pertinent comorbidities includinghistory of dementia now found to have widespread bony metastatic disease.   - I do think it is reasonable to involve palliative care in this case to discuss GOCs and establish treatment parameters. I had a discussion with the patient's daughter this morning that our options are extremely limited without surgery, and I do agree with her that surgery her mother's case should not be pursued given her condition and comorbidities. I did explain to her that if she is only interested in keeping her mother comfortable we could allow her to have PO intake for comfort understanding that she may get sick/vomit/aspirated. She was understanding of this. I will defer any further discussion or decision making until after palliative care sees patient.    - Continue NPO for now              - She was unable to tolerate NGT yesterday. Daughter does not want to continue to  pursue this.              - We should consider enema +/- disimpaction given significant stool burden on CT             - Continue IVF Resuscitation - Pain control prn; antiemetics prn             - Monitor electrolytes, renal function --> morning labs  - Monitor abdominal examination; I would recommend serial KUBs given the lack of reliable subjective data we will be able to obtain from the patient.  - No surgical intervention; Patient's daughter is not interested in pursuing any surgical intervention  regardless - Further management per primary service; we will follow   All of the above findings and recommendations were discussed with the patient's daughter and all questions and concerns were addressed and answered.   -- Edison Simon, PA-C [facility] 10/07/2019, 7:25 AM [phone] M-F: 7am - 4pm

## 2019-10-07 NOTE — Progress Notes (Signed)
PROGRESS NOTE    Katelyn [name]  AYT:[id number] DOB: 23-Jun-1923 DOA: 10/06/2019 PCP: Abelardo Diesel, NP (Inactive)  Brief Narrative:  Patient had a small bowel obstruction.  Not a surgical candidate.  Did not tolerate NG tube placement.  Daughter was in the room this morning.  Had a lengthy discussion regarding lack of options and overall clinical decline.  Daughter agreed.  Palliative care has evaluated patient.  Plan to proceed with comfort measures while in the hospital and potentially dispo to inpatient hospice unit.     Assessment & Plan:   Principal Problem:   SBO (small bowel obstruction) (HCC) Active Problems:   Essential (primary) hypertension   PAD (peripheral artery disease) (HCC)   Altered mental status   CKD (chronic kidney disease) stage 4, GFR 15-29 ml/min (HCC)   AF (paroxysmal atrial fibrillation) (HCC)   Bony metastasis (HCC)   Acute metabolic encephalopathy   Acute cystitis with hematuria   Goals of care, counseling/discussion   Palliative care by specialist   DNR (do not resuscitate)   Comfort measures only status  Small bowel obstruction General surgery has been following patient.  Not a surgical option.  Attempted NG tube.  Patient cannot tolerate.  Currently patient not vomiting.  Bili nondistended.  No further intervention from surgery standpoint  Evidence of bony metastases on CAT scan of abdomen pelvis Family not interested in further work-up.  Focus on pain control.  Comfort measures only.  DNR status  Acute metabolic encephalopathy Likely multifactorial but appears the patient has declined substantially.  Unable to verbally respond to me  Comfort measures only status Appreciate palliative care help.  Proceed with comfort measures only while in house with plans to dispo to inpatient hospice unit if clinical situation allows.  DVT prophylaxis: None  code Status: DNR Family Communication: Daughter at bedside  disposition Plan: Status is:  Inpatient  Remains inpatient appropriate because:IV treatments appropriate due to intensity of illness or inability to take PO   Dispo: The patient is from: Home              Anticipated d/c is to: Hospice              Anticipated d/c date is: 1 day              Patient currently is not medically stable to d/c.  Currently on comfort measures only status after discussion with IM, palliative care, general surgery.  All orders focused on patient comfort.  Anticipate either in hospital death or disposition to an inpatient hospice unit.   Consultants:   Palliative care  General surgery  Procedures:   None  Antimicrobials:  None   Subjective: Patient seen and examined.  Unable to respond verbally.  Objective: Vitals:   10/06/19 1950 10/06/19 2015 10/07/19 0412 10/07/19 0751  BP: (!) 115/60  (!) 111/54   Pulse: 79  103   Resp: 16  20   Temp: 97.8 F (36.6 C)  99.6 F (37.6 C)   TempSrc:   Oral   SpO2: 91% 92% 93% 94%  Weight:      Height:        Intake/Output Summary (Last 24 hours) at 10/07/2019 1615 Last data filed at 10/07/2019 0433 Gross per 24 hour  Intake --  Output 0 ml  Net 0 ml   Filed Weights   10/04/19 1651  Weight: 64 kg    Examination:  Exam deferred given comfort measures status    Data Reviewed:  I have personally reviewed following labs and imaging studies  CBC: Recent Labs  Lab 10/04/19 1705 10/06/19 0413  WBC 9.1 5.0  HGB 11.7* 11.5*  HCT 36.2 34.7*  MCV 97.8 97.2  PLT 275 102   Basic Metabolic Panel: Recent Labs  Lab 10/04/19 1705 10/06/19 0413 10/07/19 0458  NA 136 139 139  K 4.5 5.0 4.0  CL 102 102 102  CO2 24 24 24   GLUCOSE 143* 119* 124*  BUN 22 41* 52*  CREATININE 1.08* 1.68* 1.73*  CALCIUM 9.4 8.6* 8.2*  MG  --   --  2.3   GFR: Estimated Creatinine Clearance: 20.1 mL/min (A) (by C-G formula based on SCr of 1.73 mg/dL (H)). Liver Function Tests: Recent Labs  Lab 10/04/19 1705  AST 14*  ALT 8  ALKPHOS 62   BILITOT 0.7  PROT 7.3  ALBUMIN 4.0   Recent Labs  Lab 10/04/19 1705  LIPASE 17   No results for input(s): AMMONIA in the last 168 hours. Coagulation Profile: No results for input(s): INR, PROTIME in the last 168 hours. Cardiac Enzymes: No results for input(s): CKTOTAL, CKMB, CKMBINDEX, TROPONINI in the last 168 hours. BNP (last 3 results) No results for input(s): PROBNP in the last 8760 hours. HbA1C: No results for input(s): HGBA1C in the last 72 hours. CBG: No results for input(s): GLUCAP in the last 168 hours. Lipid Profile: No results for input(s): CHOL, HDL, LDLCALC, TRIG, CHOLHDL, LDLDIRECT in the last 72 hours. Thyroid Function Tests: No results for input(s): TSH, T4TOTAL, FREET4, T3FREE, THYROIDAB in the last 72 hours. Anemia Panel: No results for input(s): VITAMINB12, FOLATE, FERRITIN, TIBC, IRON, RETICCTPCT in the last 72 hours. Sepsis Labs: No results for input(s): PROCALCITON, LATICACIDVEN in the last 168 hours.  Recent Results (from the past 240 hour(s))  SARS Coronavirus 2 by RT PCR (hospital order, performed in [facility] hospital lab) Nasopharyngeal Nasopharyngeal Swab     Status: None   Collection Time: 09/23/2019 10:23 AM   Specimen: Nasopharyngeal Swab  Result Value Ref Range Status   SARS Coronavirus 2 NEGATIVE NEGATIVE Final    Comment: (NOTE) SARS-CoV-2 target nucleic acids are NOT DETECTED.  The SARS-CoV-2 RNA is generally detectable in upper and lower respiratory specimens during the acute phase of infection. The lowest concentration of SARS-CoV-2 viral copies this assay can detect is 250 copies / mL. A negative result does not preclude SARS-CoV-2 infection and should not be used as the sole basis for treatment or other patient management decisions.  A negative result may occur with improper specimen collection / handling, submission of specimen other than nasopharyngeal swab, presence of viral mutation(s) within the areas targeted by this assay,  and inadequate number of viral copies (<250 copies / mL). A negative result must be combined with clinical observations, patient history, and epidemiological information.  Fact Sheet for Patients:   StrictlyIdeas.no  Fact Sheet for Healthcare Providers: BankingDealers.co.za  This test is not yet approved or  cleared by the [place] FDA and has been authorized for detection and/or diagnosis of SARS-CoV-2 by FDA under an Emergency Use Authorization (EUA).  This EUA will remain in effect (meaning this test can be used) for the duration of the COVID-19 declaration under Section 564(b)(1) of the Act, 21 U.S.C. section 360bbb-3(b)(1), unless the authorization is terminated or revoked sooner.  Performed at [facility], [address], [place], [place] [address]   Urine Culture     Status: Abnormal   Collection Time: 10/06/19  5:26 AM   Specimen: Urine, Random  Result Value Ref Range Status   Specimen Description   Final    URINE, RANDOM Performed at [facility], [address] [place], [place] [address]    Special Requests   Final    NONE Performed at [facility], [address] [place], [place] [address]    Culture MULTIPLE SPECIES PRESENT, SUGGEST RECOLLECTION (A)  Final   Report Status 10/07/2019 FINAL  Final         Radiology Studies: DG Abd 1 View  Result Date: 10/07/2019 CLINICAL DATA:  Small-bowel obstruction, history of constipation and incontinence EXAM: ABDOMEN - 1 VIEW COMPARISON:  October 06, 2019 FINDINGS: Abundant stool in the rectum. Persistent small bowel distension in the upper abdomen. Lower pole calculus on the RIGHT 6 mm. Similar to the recent CT evaluation. Degenerative changes and signs of lumbar spine compression fractures not well evaluated but grossly similar to prior imaging. Heterogeneous appearance of visualized bones again suggestive of bony metastatic disease  particularly about the pelvis. IMPRESSION: 1. Dilation of small bowel again compatible with small bowel obstruction. 2. Abundant stool in the rectum but without colonic distension proximal to the rectum. 3. Heterogeneous appearance of visualized bones again suggestive of bony metastatic disease. Electronically Signed   By: Zetta Bills M.D.   On: 10/07/2019 09:51   DG Abd Portable 1V  Result Date: 10/06/2019 CLINICAL DATA:  Small-bowel obstruction. EXAM: PORTABLE ABDOMEN - 1 VIEW COMPARISON:  CT 09/11/2019. FINDINGS: Surgical clips noted in the abdomen. Persistent distention of small bowel loops with a relative paucity of intra colonic gas again noted consistent with persistent small-bowel obstruction. No free air identified. Diffuse osteopenia. Degenerative changes scoliosis thoracic spine. Degenerative changes both hips. Multiple mixed bony lesions are again noted particularly in the pelvis consistent with a possibility of metastatic disease. These are best seen by prior CT contrast from prior CT noted the bladder. IMPRESSION: 1. Persistent distention of small bowel consistent with persistent small-bowel obstruction. No free air. 2. Multiple makes bony lesions are again noted particularly in the pelvis consistent possibility of metastatic disease. These are best seen by prior CT. Electronically Signed   By: Marcello Moores  Register   On: 10/06/2019 08:27        Scheduled Meds: . feeding supplement  1 Container Oral NOW   Continuous Infusions:   LOS: 2 days    Time spent: 15 minutes    Sidney Ace, MD [facility] Pager [phone]  If 7PM-7AM, please contact night-coverage 10/07/2019, 4:15 PM

## 2019-10-07 NOTE — Progress Notes (Signed)
   10/07/19 1240  Clinical Encounter Type  Visited With Patient and family together  Visit Type Initial  Referral From Chaplain  Consult/Referral To [place] met on of patient's daughters in the hall and she said her mother was dying and there were three people already in the room. Chaplain went to room and found sister, daughter, and grandson around the bed. Chaplain asked if she could pray with them. After praying chaplain asked them to tell her something about the patient. Her sister said they travel a lot and they went to various churches together. She said that he sister loved to wear big hats. Grandson said his grandmother told him "don't forget to pray." Her daughter said her mother loved hats, big hats. After walking two people to the waiting room, chaplain escorted another daughter and son-in-law to the room. On the way to the room, daughter said her mother was a good mother and son-in-law said she is sweet. Chaplain will have on call chaplain check on them later.

## 2019-10-07 NOTE — Consult Note (Signed)
Consultation Note Date: 10/07/2019   Patient Name: Katelyn [name]  DOB: April 11, 1923  MRN: [id number]  Age / Sex: 86 y.o., female  PCP: Abelardo Diesel, NP (Inactive) Referring Physician: Sidney Ace, MD  Reason for Consultation: Establishing goals of care  HPI/Patient Profile: 86 y.o. female  with past medical history of CKD, HTN, PVD, CVA, and dementia admitted on 09/13/2019 with abdominal pain.  Found to have SBO - no surgical options and did not tolerate NG insertion. Bony metastases seen on CT scan of abdomen and pelvis - no plan for further work up. PMT consulted for [place].  Clinical Assessment and Goals of Care: I have reviewed medical records including EPIC notes, labs and imaging, received report from RN and Dr. Priscella Mann, assessed the patient and then met with patient's daughters Bonnita Nasuti and [name]  to discuss diagnosis prognosis, GOC, EOL wishes, disposition and options.  I introduced Palliative Medicine as specialized medical care for people living with serious illness. It focuses on providing relief from the symptoms and stress of a serious illness. The goal is to improve quality of life for both the patient and the family.  We discussed a brief life review of the patient. Patient has 8 children, 7 living. They describe her as a strong woman. She also worked in a factory.   As far as functional and nutritional status, they tell me prior to current illness patient was doing well at home. She could feed herself. She did need assistance with bathing. She was living alone at home with her children helping care for her regularly.   We discussed patient's current illness and what it means in the larger context of patient's on-going co-morbidities.  Natural disease trajectory and expectations at EOL were discussed. We discussed lack of interventions available for patient. Discussed concern about worsening mental status and inability to tolerate  intake.   I attempted to elicit values and goals of care important to the patient.  The difference between aggressive medical intervention and comfort care was considered in light of the patient's goals of care. Family is not interested in continued aggressive medical interventions - they would like to transition to focus on her comfort.  Hospice services were explained and offered - family took time to consider options and later told me they were interested in hospice facility. They do not feel they are able to take her home and care for her even with support of hospice. Patient does seem to be worsening rapidly - less responsive each time I visit her. Family okay with starting comfort care here and see if she remains appropriate for transfer tomorrow.   Family prepared that patient appears to be declining rapidly. Discussed terminal symptoms and methods utilized to alleviate suffering. Educated on minimal sips/bites for pleasure but stopping if any worsening of symptoms.  Questions and concerns were addressed. The family was encouraged to call with questions or concerns.   Primary Decision Maker NEXT OF KIN - majority of children    SUMMARY OF RECOMMENDATIONS   - full comfort care - potential transfer to hospice facility depending on bed availability and patient ability to tolerate transfer - hospice liaison and RNCM aware of situation  Code Status/Advance Care Planning:  DNR   Symptom Management:   PRN morphine, haldol, ativan, robinul   Additional Recommendations (Limitations, Scope, Preferences):  Full Comfort Care  Prognosis:   < 2 weeks  Discharge Planning: To Be Determined      Primary Diagnoses: Present on Admission: .  SBO (small bowel obstruction) ([place]) . Altered mental status . CKD (chronic kidney disease) stage 4, GFR 15-29 ml/min (HCC) . Essential (primary) hypertension . PAD (peripheral artery disease) ([place]) . AF (paroxysmal atrial fibrillation) ([place]) .  Bony metastasis ([place])   I have reviewed the medical record, interviewed the patient and family, and examined the patient. The following aspects are pertinent.  Past Medical History:  Diagnosis Date  . Abdominal pain   . Anxiety   . Atherosclerosis   . Chronic kidney disease    stage 3  . Colon polyps   . Constipation   . Cough with expectoration   . Dependent edema bilateral legs  . Gait abnormality   . Gout   . Hearing loss   . Hearing loss   . Hemorrhoids   . Hypertension   . Incontinence   . Osteoarthritis   . Osteoarthritis   . Osteoarthritis   . Paget's disease of bone   . Peripheral vascular disease ([place])   . Pneumonia   . Proteinuria   . Stroke [facility])    Social History   Socioeconomic History  . Marital status: Widowed    Spouse name: Not on file  . Number of children: Not on file  . Years of education: Not on file  . Highest education level: Not on file  Occupational History  . Not on file  Tobacco Use  . Smoking status: Never Smoker  . Smokeless tobacco: Never Used  Substance and Sexual Activity  . Alcohol use: No    Alcohol/week: 0.0 standard drinks  . Drug use: No  . Sexual activity: Not on file  Other Topics Concern  . Not on file  Social History Narrative   Lives at home with family.  Wheelchair-bound at baseline   Social Determinants of Radio broadcast assistant Strain:   . Difficulty of Paying Living Expenses:   Food Insecurity:   . Worried About Charity fundraiser in the Last Year:   . Arboriculturist in the Last Year:   Transportation Needs:   . Film/video editor (Medical):   Marland Kitchen Lack of Transportation (Non-Medical):   Physical Activity:   . Days of Exercise per Week:   . Minutes of Exercise per Session:   Stress:   . Feeling of Stress :   Social Connections:   . Frequency of Communication with Friends and Family:   . Frequency of Social Gatherings with Friends and Family:   . Attends Religious Services:   . Active Member  of Clubs or Organizations:   . Attends Archivist Meetings:   Marland Kitchen Marital Status:    Family History  Problem Relation Age of Onset  . Diabetes Mother   . Colon cancer Father   . Cancer Sister   . Cancer Sister   . Cancer Sister    Scheduled Meds: Continuous Infusions: PRN Meds:.antiseptic oral rinse, glycopyrrolate **OR** glycopyrrolate **OR** glycopyrrolate, haloperidol **OR** haloperidol **OR** haloperidol lactate, LORazepam **OR** LORazepam **OR** LORazepam, morphine injection, polyvinyl alcohol Allergies  Allergen Reactions  . Bee Venom Anaphylaxis   Review of Systems  Unable to perform ROS: Mental status change    Physical Exam Constitutional:      Comments: lethargic  Pulmonary:     Effort: Pulmonary effort is normal.  Abdominal:     General: There is distension.     Tenderness: There is abdominal tenderness.  Skin:    General: Skin is warm and dry.  Neurological:  Mental Status: She is disoriented.     Vital Signs: BP (!) 111/54 (BP Location: Right Arm)   Pulse 103   Temp 99.6 F (37.6 C) (Oral)   Resp 20   Ht _0  (1.727 m)   Wt 64 kg   SpO2 94%   BMI 21.45 kg/m  Pain Scale: 0-10   Pain Score: Asleep   SpO2: SpO2: 94 % O2 Device:SpO2: 94 % O2 Flow Rate: .   IO: Intake/output summary:   Intake/Output Summary (Last 24 hours) at 10/07/2019 1333 Last data filed at 10/07/2019 6767 Gross per 24 hour  Intake --  Output 0 ml  Net 0 ml    LBM: Last BM Date: 10/04/19 (per family pt had minimal stool output Sunday) Baseline Weight: Weight: 64 kg Most recent weight: Weight: 64 kg     Palliative Assessment/Data: PPS 10%    Time Total: 70 minutes Greater than 50%  of this time was spent counseling and coordinating care related to the above assessment and plan.  Juel Burrow, DNP, AGNP-C Palliative Medicine Team [phone] Pager: [phone]

## 2019-10-07 NOTE — TOC Initial Note (Signed)
Transition of Care (TOC) - Initial/Assessment Note    Patient Details  Name: Katelyn [name] MRN: [id number] Date of Birth: 09/28/1923  Transition of Care (TOC) CM/SW Contact:    ,  T, RN Phone Number: 10/07/2019, 3:20 PM  Clinical Narrative:                  7/27 - Patient lives at home with daughter with [facility] services.  RNCM spoke with [facility] NO Darker Howell.  States that at baseline patient is able to ambulated with RW.  Has RW, WC,  BSC, and hospital bed in the home. Darker states that patient has been on their comfort measures in the home for 2 years, that she has PRN medications if needed, however has not required them in the home  7/28 - Palliative has met with patient and family.  Patient rapidly declining. Hospice services were discussed with family.  TOC followed up with Darker at PACE.  She states that if patient requires inpatient hospice services will need to go to [facility] with hospices services.  They request I complete an FL2 and send to [facility] in the HUB.  [facility] to follow up with daughter directly and discuss her options. Per MD patient potentially hospital death .   Expected Discharge Plan: Hospice Medical Facility     Patient Goals and CMS Choice        Expected Discharge Plan and Services Expected Discharge Plan: Hospice Medical Facility                                              Prior Living Arrangements/Services                       Activities of Daily Living Home Assistive Devices/Equipment: Bedside commode/3-in-1 ADL Screening (condition at time of admission) Patient's cognitive ability adequate to safely complete daily activities?: No Is the patient deaf or have difficulty hearing?: Yes Does the patient have difficulty seeing, even when wearing glasses/contacts?: No Does the patient have difficulty concentrating, remembering, or making decisions?: Yes Patient able to express need for assistance  with ADLs?: Yes Does the patient have difficulty dressing or bathing?: Yes Independently performs ADLs?: No Communication: Independent Dressing (OT): Dependent Is this a change from baseline?: Pre-admission baseline Grooming: Dependent Is this a change from baseline?: Pre-admission baseline Feeding: Needs assistance Is this a change from baseline?: Pre-admission baseline Bathing: Dependent Is this a change from baseline?: Pre-admission baseline Toileting: Dependent Is this a change from baseline?: Pre-admission baseline In/Out Bed: Dependent Is this a change from baseline?: Pre-admission baseline Walks in Home: Needs assistance Is this a change from baseline?: Pre-admission baseline Does the patient have difficulty walking or climbing stairs?: Yes Weakness of Legs: Both Weakness of Arms/Hands: Both  Permission Sought/Granted                  Emotional Assessment              Admission diagnosis:  SBO (small bowel obstruction) (HCC) [K56.609] Chest pain [R07.9] Patient Active Problem List   Diagnosis Date Noted  . Goals of care, counseling/discussion   . Palliative care by specialist   . DNR (do not resuscitate)   . Comfort measures only status   . Acute metabolic encephalopathy   . Acute cystitis with hematuria   .   SBO (small bowel obstruction) ([place])   . AF (paroxysmal atrial fibrillation) ([place]) 10/07/2019  . Bony metastasis ([place]) 09/11/2019  . CKD (chronic kidney disease) stage 4, GFR 15-29 ml/min (HCC) 05/31/2019  . Leukocytosis 05/31/2019  . Atrial fibrillation with RVR ([place]) 05/31/2019  . Acute CVA (cerebrovascular accident) ([place]) 05/31/2019  . Hyperthyroidism 05/31/2019  . Pressure injury of skin 04/22/2018  . CAP (community acquired pneumonia) 04/21/2018  . Altered mental status   . PAD (peripheral artery disease) ([place]) 06/11/2016  . CVA (cerebral vascular accident) ([place]) 12/23/2014  . CKD (chronic kidney disease) stage 3, GFR 30-59 ml/min  10/20/2014  . Hyperglycemia 10/20/2014  . Anxiety, mild 07/05/2014  . Essential (primary) hypertension 07/05/2014  . Acquired hallux valgus 07/05/2014  . Gastritis, Helicobacter pylori 82/99/3716  . History of colon polyps 07/05/2014  . Arthritis of knee, degenerative 07/05/2014   PCP:  Abelardo Diesel, NP (Inactive) Pharmacy:   [facility], [place], [place] [address] [place] [address] Phone: [phone] Fax: [place], [place] - [place] [address] Phone: [phone] Fax: [phone]  [place], [place] - [address] [place] [address] Phone: [phone] Fax: [phone]     Social Determinants of Health (SDOH) Interventions    Readmission Risk Interventions Readmission Risk Prevention Plan 10/07/2019 10/06/2019  Transportation Screening Complete Complete  HRI or Home Care Consult Complete -  Palliative Care Screening Complete Complete  Medication Review (RN Care Manager) - Complete  Some recent data might be hidden

## 2019-10-08 MED ORDER — ACETAMINOPHEN 650 MG RE SUPP
650.0000 mg | RECTAL | Status: DC | PRN
  Administered 2019-10-08 – 2019-10-09 (×3): 650 mg via RECTAL
  Filled 2019-10-08 (×2): qty 1

## 2019-10-08 MED ORDER — SODIUM CHLORIDE 0.9 % IV SOLN
INTRAVENOUS | Status: DC | PRN
  Administered 2019-10-08: 500 mL via INTRAVENOUS

## 2019-10-08 MED ORDER — ACETAMINOPHEN 10 MG/ML IV SOLN
1000.0000 mg | Freq: Four times a day (QID) | INTRAVENOUS | Status: DC | PRN
  Administered 2019-10-08: 1000 mg via INTRAVENOUS
  Filled 2019-10-08 (×2): qty 100

## 2019-10-08 MED ORDER — KETOROLAC TROMETHAMINE 15 MG/ML IJ SOLN
15.0000 mg | Freq: Four times a day (QID) | INTRAMUSCULAR | Status: DC | PRN
  Administered 2019-10-08: 15 mg via INTRAVENOUS
  Filled 2019-10-08: qty 1

## 2019-10-08 NOTE — Progress Notes (Addendum)
   10/08/19 1255  Clinical Encounter Type  Visited With Patient and family together  Visit Type Follow-up  Referral From Palliative care team  Consult/Referral To Chaplain  Chaplain received a message via chat to check in on patient and family. When chaplain arrive, two daughters Letta Median & Bonnita Nasuti) were at the bedside. When asked how they were doing, the oldest daughter said that "after talking with her niece she was wondering if the right decision had been made not to have surgery preformed on Mother." Chaplain asked her if her mother was strong enough to undergo surgery and she said no. She said that she didn't want to be selfish. Chaplain asked her what would her mother want and she said not to have the surgery. Chaplain encouraged her to lend on what she knows her mother would want and not to second guess herself. She thanked Clinical biochemist. Chaplain asked Letta Median to tell her something special about her mother and she told chaplain about a time her mother went to [place] and brought her back a book signed by Alcus Dad King's children. Her mother knew that she loves looks and loves to read. Another time her mother took her to an event where Johny Shock was and he autograph her book. Chaplain encouraged her to hold on to those memories. Chaplain will check in on them later.  Later in the day, chaplain stopped by to check on patient and her family. The oldest sister pointed to a young lady that was there and Letta Median got up so the young lady Alvie Heidelberg) could sit down and talk to the chaplain. This is the person the daughters wanted the chaplain to talk to. Alvie Heidelberg is the granddaughter that want her grandmother to have surgery, or she just wanted something to be done to keep her grandmother alive. Chaplain asked Alvie Heidelberg if she thought her grandmother was strong enough to go through surgery and she said no. Chaplain asked Alvie Heidelberg to tell her some thing about her grandmother. She said "she is kind. She listens. She  is loving. She cares. She is my grandmom. Chaplain encouraged her to hold on the memories that she has. She also told Alvie Heidelberg to cry when she needed to cry. Chaplain prayed with Alvie Heidelberg, asking God to heal her heart as she goes through the grieving process. Chaplain told family of chaplain's availability and told them that she will have someone check on them tomorrow.   Chaplain also asked the secretary to order a new comfort cart.

## 2019-10-08 NOTE — Care Management Important Message (Signed)
Important Message  Patient Details  Name: Katelyn [name] MRN: [id number] Date of Birth: 18-Jan-1924   Medicare Important Message Given:  Other (see comment)  On comfort care.  Medicare IM not given out of respect for patient and family.     Dannette Barbara 10/08/2019, 12:03 PM

## 2019-10-08 NOTE — Progress Notes (Addendum)
Daily Progress Note   Patient Name: Kesleigh Morson Akey       Date: 10/08/2019 DOB: [birth year]  Age: 86 y.o. MRN#: [id number] Attending Physician: Sidney Ace, MD Primary Care Physician: Abelardo Diesel, NP (Inactive) Admit Date: 09/29/2019  Reason for Consultation/Follow-up: Terminal Care  Subjective: Daughter at bedside, patient unresponsive but appears comfortable  Length of Stay: 3  Current Medications: Scheduled Meds:    Continuous Infusions:   PRN Meds: acetaminophen, antiseptic oral rinse, glycopyrrolate **OR** glycopyrrolate **OR** glycopyrrolate, haloperidol **OR** haloperidol **OR** haloperidol lactate, LORazepam **OR** LORazepam **OR** LORazepam, morphine injection, polyvinyl alcohol  Physical Exam          Vital Signs: BP (!) 111/51 (BP Location: Right Arm)   Pulse 65   Temp 99.8 F (37.7 C) (Axillary)   Resp (!) 30   Ht 5\' 8"  (1.727 m)   Wt 64 kg   SpO2 96%   BMI 21.45 kg/m  SpO2: SpO2: 96 % O2 Device: O2 Device: Room Air O2 Flow Rate:    Intake/output summary: No intake or output data in the 24 hours ending 10/08/19 0917 LBM: Last BM Date: 10/07/19 Baseline Weight: Weight: 64 kg Most recent weight: Weight: 64 kg       Palliative Assessment/Data: PPS 10%    Flowsheet Rows     Most Recent Value  Intake Tab  Referral Department Hospitalist  Unit at Time of Referral Med/Surg Unit  Date Notified 10/06/19  Palliative Care Type New Palliative care  Reason for referral Clarify Goals of Care  Date of Admission 09/30/2019  Date first seen by Palliative Care 10/07/19  # of days Palliative referral response time 1 Day(s)  # of days IP prior to Palliative referral 1  Clinical Assessment  Palliative Performance Scale Score 10%  Psychosocial & Spiritual  Assessment  Palliative Care Outcomes  Patient/Family meeting held? Yes  Who was at the meeting? daughters  Palliative Care Outcomes Improved pain interventions, Improved non-pain symptom therapy, Clarified goals of care, Counseled regarding hospice, Provided end of life care assistance, Provided psychosocial or spiritual support, Changed to focus on comfort, Transitioned to hospice      Patient Active Problem List   Diagnosis Date Noted  . Goals of care, counseling/discussion   . Palliative care by specialist   . DNR (do not resuscitate)   . Comfort measures only status   . Acute metabolic encephalopathy   . Acute cystitis with hematuria   . SBO (small bowel obstruction) ([place]) 09/24/2019  . AF (paroxysmal atrial fibrillation) ([place]) 09/29/2019  . Bony metastasis ([place]) 09/29/2019  . CKD (chronic kidney disease) stage 4, GFR 15-29 ml/min (HCC) 05/31/2019  . Leukocytosis 05/31/2019  . Atrial fibrillation with RVR ([place]) 05/31/2019  . Acute CVA (cerebrovascular accident) ([place]) 05/31/2019  . Hyperthyroidism 05/31/2019  . Pressure injury of skin 04/22/2018  . CAP (community acquired pneumonia) 04/21/2018  . Altered mental status   . PAD (peripheral artery disease) ([place]) 06/11/2016  . CVA (cerebral vascular accident) ([place]) 12/23/2014  . CKD (chronic kidney disease) stage 3, GFR 30-59 ml/min 10/20/2014  . Hyperglycemia 10/20/2014  . Anxiety, mild 07/05/2014  . Essential (primary) hypertension 07/05/2014  . Acquired hallux valgus 07/05/2014  .  Gastritis, Helicobacter pylori 30/11/2328  . History of colon polyps 07/05/2014  . Arthritis of knee, degenerative 07/05/2014    Palliative Care Assessment & Plan   HPI:  86 y.o. female  with past medical history of CKD, HTN, PVD, CVA, and dementia admitted on 09/12/2019 with abdominal pain.  Found to have SBO - no surgical options and did not tolerate NG insertion. Bony metastases seen on CT scan of abdomen and pelvis - no plan for further work  up. PMT consulted for [place].  Assessment: Daughter Bonnita Nasuti at bedside - patient appears comfortable with unlabored breathing. Unresponsive. Skin warm to touch.   Emotional support provided to helen. Discussed expectations as patient nears end of life. Discussed measures utilized to ensure comfort.  Bonnita Nasuti tells me she is struggling with guilt wondering if she should've pushed for more aggressive measures. We again discuss situation. Discussed lack of options as patient is poor surgical candidate. Discussed also with metastatic disease. Discussed honoring patient's wishes. Bonnita Nasuti agrees and expresses thankfulness for conversation. She requests that I call patient's niece later to further discuss situation and she feels niece is having a hard time understanding situation. Will plan to call this afternoon.  Daughter requests patient stay here and not be transferred elsewhere for comfort measures. Patient appears to be actively dying and is unresponsive.  Addendum: Called back to room as niece present at bedside and struggling with decision for comfort care. We reviewed Ms. Lovin's condition and options for treatment. Reviewed her baseline frailty and chronic conditions. Discussed honoring Ms. Laux's wishes as we care for her as she nears end of life. Discussed focusing on comfort and quality of life. Niece agrees this is appropriate. She expresses feelings of guilt - feeling like she could have prevented situation or spent more time with Ms. Almyra Deforest. We discussed her spiritual beliefs. Emotional support and therapeutic listening was provided. Ultimately niece agreed to plan of care to focus on comfort and was very grateful for care being provided. All questions and concerns addressed.   Recommendations/Plan:  Continue full comfort measures - patient appears to be actively dying, unresponsive  Daughter requests patient not be transferred - worries she would not tolerate transfer   Goals of Care and  Additional Recommendations:  Limitations on Scope of Treatment: Full Comfort Care  Code Status:  DNR  Prognosis:   Hours - Days  Discharge Planning:  Anticipated Hospital Death  Care plan was discussed with chaplain, patient's daughter  Thank you for allowing the Palliative Medicine Team to assist in the care of this patient.   Total Time 60 minutes Prolonged Time Billed  no       Greater than 50%  of this time was spent counseling and coordinating care related to the above assessment and plan.  Juel Burrow, DNP, [facility] Palliative Medicine Team Team Phone # [phone]  Pager [phone]

## 2019-10-08 NOTE — Progress Notes (Signed)
PROGRESS NOTE    Katelyn [name]  HYW:[id number] DOB: 18-Nov-1923 DOA: 09/30/2019 PCP: Abelardo Diesel, NP (Inactive)  Brief Narrative:  Patient had a small bowel obstruction.  Not a surgical candidate.  Did not tolerate NG tube placement.  Daughter was in the room this morning.  Had a lengthy discussion regarding lack of options and overall clinical decline.  Daughter agreed.  Palliative care has evaluated patient.  Plan to proceed with comfort measures while in the hospital and potentially dispo to inpatient hospice unit.  7/29: Patient seen and examined.  Daughter at bedside.  Patient has been having fevers overnight.  Verbally unresponsive.  Does not appear in pain   Assessment & Plan:   Principal Problem:   SBO (small bowel obstruction) (HCC) Active Problems:   Essential (primary) hypertension   PAD (peripheral artery disease) (HCC)   Altered mental status   CKD (chronic kidney disease) stage 4, GFR 15-29 ml/min (HCC)   AF (paroxysmal atrial fibrillation) (HCC)   Bony metastasis (HCC)   Acute metabolic encephalopathy   Acute cystitis with hematuria   Goals of care, counseling/discussion   Palliative care by specialist   DNR (do not resuscitate)   Comfort measures only status  Small bowel obstruction General surgery has been following patient.  Not a surgical option.  Attempted NG tube.  Patient cannot tolerate.  Currently patient not vomiting.  Belly nondistended.  No further intervention from surgery standpoint  Evidence of bony metastases on CAT scan of abdomen pelvis Family not interested in further work-up.  Focus on pain control.  Comfort measures only.  DNR status  Acute metabolic encephalopathy Likely multifactorial but appears the patient has declined substantially.  Unable to verbally respond to me  Comfort measures only status Appreciate palliative care help.  Proceed with comfort measures only while in house with plans to dispo to inpatient hospice unit if  clinical situation allows. -Patient has been having fevers of unclear etiology.  Not very responsive to IV or per rectal Tylenol.  Will attempt Toradol.  If ineffective likely will not attempt other options.  Will focus on patient comfort.  DVT prophylaxis: None  code Status: DNR Family Communication: Daughter at bedside  disposition Plan: Status is: Inpatient  Remains inpatient appropriate because:IV treatments appropriate due to intensity of illness or inability to take PO   Dispo: The patient is from: Home              Anticipated d/c is to: Hospice              Anticipated d/c date is: 1 day              Patient currently is not medically stable to d/c.  Currently on comfort measures only status after discussion with IM, palliative care, general surgery.  All orders focused on patient comfort.  Family struggling with decision however appreciate palliative care involvement.  Family in agreement that this is within the patient's wishes.   Consultants:   Palliative care  General surgery  Procedures:   None  Antimicrobials:  None   Subjective: Patient seen and examined.  Unable to respond verbally.  Objective: Vitals:   10/08/19 0404 10/08/19 0500 10/08/19 0744 10/08/19 0928  BP: (!) 111/51     Pulse: 65     Resp:  (!) 30    Temp: 97.8 F (36.6 C) (!) 100.6 F (38.1 C) 99.8 F (37.7 C) (!) 100.8 F (38.2 C)  TempSrc: Oral Axillary Axillary Axillary  SpO2: 96%     Weight:      Height:       No intake or output data in the 24 hours ending 10/08/19 1459 Filed Weights   10/04/19 1651  Weight: 64 kg    Examination:  Abbreviated exam due to comfort measures status  Constitutional: Verbally unresponsive, no apparent pain Cardiac: Regular rate and rhythm, no murmurs Respiratory: Clear to auscultation bilaterally, normal work of breathing    Data Reviewed: I have personally reviewed following labs and imaging studies  CBC: Recent Labs  Lab  10/04/19 1705 10/06/19 0413  WBC 9.1 5.0  HGB 11.7* 11.5*  HCT 36.2 34.7*  MCV 97.8 97.2  PLT 275 732   Basic Metabolic Panel: Recent Labs  Lab 10/04/19 1705 10/06/19 0413 10/07/19 0458  NA 136 139 139  K 4.5 5.0 4.0  CL 102 102 102  CO2 24 24 24   GLUCOSE 143* 119* 124*  BUN 22 41* 52*  CREATININE 1.08* 1.68* 1.73*  CALCIUM 9.4 8.6* 8.2*  MG  --   --  2.3   GFR: Estimated Creatinine Clearance: 20.1 mL/min (A) (by C-G formula based on SCr of 1.73 mg/dL (H)). Liver Function Tests: Recent Labs  Lab 10/04/19 1705  AST 14*  ALT 8  ALKPHOS 62  BILITOT 0.7  PROT 7.3  ALBUMIN 4.0   Recent Labs  Lab 10/04/19 1705  LIPASE 17   No results for input(s): AMMONIA in the last 168 hours. Coagulation Profile: No results for input(s): INR, PROTIME in the last 168 hours. Cardiac Enzymes: No results for input(s): CKTOTAL, CKMB, CKMBINDEX, TROPONINI in the last 168 hours. BNP (last 3 results) No results for input(s): PROBNP in the last 8760 hours. HbA1C: No results for input(s): HGBA1C in the last 72 hours. CBG: No results for input(s): GLUCAP in the last 168 hours. Lipid Profile: No results for input(s): CHOL, HDL, LDLCALC, TRIG, CHOLHDL, LDLDIRECT in the last 72 hours. Thyroid Function Tests: No results for input(s): TSH, T4TOTAL, FREET4, T3FREE, THYROIDAB in the last 72 hours. Anemia Panel: No results for input(s): VITAMINB12, FOLATE, FERRITIN, TIBC, IRON, RETICCTPCT in the last 72 hours. Sepsis Labs: No results for input(s): PROCALCITON, LATICACIDVEN in the last 168 hours.  Recent Results (from the past 240 hour(s))  SARS Coronavirus 2 by RT PCR (hospital order, performed in [facility] hospital lab) Nasopharyngeal Nasopharyngeal Swab     Status: None   Collection Time: 09/19/2019 10:23 AM   Specimen: Nasopharyngeal Swab  Result Value Ref Range Status   SARS Coronavirus 2 NEGATIVE NEGATIVE Final    Comment: (NOTE) SARS-CoV-2 target nucleic acids are NOT  DETECTED.  The SARS-CoV-2 RNA is generally detectable in upper and lower respiratory specimens during the acute phase of infection. The lowest concentration of SARS-CoV-2 viral copies this assay can detect is 250 copies / mL. A negative result does not preclude SARS-CoV-2 infection and should not be used as the sole basis for treatment or other patient management decisions.  A negative result may occur with improper specimen collection / handling, submission of specimen other than nasopharyngeal swab, presence of viral mutation(s) within the areas targeted by this assay, and inadequate number of viral copies (<250 copies / mL). A negative result must be combined with clinical observations, patient history, and epidemiological information.  Fact Sheet for Patients:   StrictlyIdeas.no  Fact Sheet for Healthcare Providers: BankingDealers.co.za  This test is not yet approved or  cleared by the [place] FDA and has been authorized  for detection and/or diagnosis of SARS-CoV-2 by FDA under an Emergency Use Authorization (EUA).  This EUA will remain in effect (meaning this test can be used) for the duration of the COVID-19 declaration under Section 564(b)(1) of the Act, 21 U.S.C. section 360bbb-3(b)(1), unless the authorization is terminated or revoked sooner.  Performed at [facility], [address], [place], [place] [address]   Urine Culture     Status: Abnormal   Collection Time: 10/06/19  5:26 AM   Specimen: Urine, Random  Result Value Ref Range Status   Specimen Description   Final    URINE, RANDOM Performed at [facility], [address], [place], [place] [address]    Special Requests   Final    NONE Performed at [facility], [address] [place], [place] [address]    Culture MULTIPLE SPECIES PRESENT, SUGGEST RECOLLECTION (A)  Final   Report Status 10/07/2019 FINAL  Final          Radiology Studies: DG Abd 1 View  Result Date: 10/07/2019 CLINICAL DATA:  Small-bowel obstruction, history of constipation and incontinence EXAM: ABDOMEN - 1 VIEW COMPARISON:  October 06, 2019 FINDINGS: Abundant stool in the rectum. Persistent small bowel distension in the upper abdomen. Lower pole calculus on the RIGHT 6 mm. Similar to the recent CT evaluation. Degenerative changes and signs of lumbar spine compression fractures not well evaluated but grossly similar to prior imaging. Heterogeneous appearance of visualized bones again suggestive of bony metastatic disease particularly about the pelvis. IMPRESSION: 1. Dilation of small bowel again compatible with small bowel obstruction. 2. Abundant stool in the rectum but without colonic distension proximal to the rectum. 3. Heterogeneous appearance of visualized bones again suggestive of bony metastatic disease. Electronically Signed   By: Zetta Bills M.D.   On: 10/07/2019 09:51        Scheduled Meds:  Continuous Infusions: . sodium chloride 500 mL (10/08/19 1236)  . acetaminophen 1,000 mg (10/08/19 1237)     LOS: 3 days    Time spent: 15 minutes    Sidney Ace, MD [facility] Pager [phone]  If 7PM-7AM, please contact night-coverage 10/08/2019, 2:59 PM

## 2019-10-08 NOTE — Progress Notes (Signed)
[facility] visited pt.'s rm. per referral from [place].  Pt. on comfort care per RN; when [facility] arrived, Pt. lying in bed breathing heavily with gdtr. Alvie Heidelberg and son Gerald Stabs @ bedside; son was singing a gospel son to pt.  When son finished song he expressed his gratitude for the chaplaincy support he and family have received here at [facility]; son shared that he had come in the late afternoon to give his two sisters who were at bedside all day a break; son experienced an encounter with God during his drive to hospital and described his sense of profound divine peace at this time; he shared his process of processing his mother's illness and imminent death, mentioning several images from the Bible and personal anecdotes that have helped him make meaning of the current situation.  Gdtr. remained focused on stroking pt.'s arm and speaking softly to her during [facility] conversation w/son.  CH remained w/family for a little while and then offered a prayer for their process of grieving, as well as a prayer of blessing for pt. as she transitions.  Son plans to spend the night tonight and is aware of [address] availability if needed.

## 2019-10-08 NOTE — Progress Notes (Signed)
Upon turning patient, noticed her skin to be hot. Temperature 102.7; On-call MD notified for PR Tylenol; Tylenol given; will continue to monitor fever. Family at bedside; not responsive at this time; mouth care given; less rattling noted; Barbaraann Faster, RN 10/08/2019 10/08/2019 4:02 AM

## 2019-10-08 NOTE — Progress Notes (Addendum)
   10/08/19 0755  Clinical Encounter Type  Visited With Patient and family together  Visit Type Follow-up  Referral From Chaplain  Consult/Referral To Chaplain  Chaplain checked in on patient and family. Oldest daughter is at bedside and said her brother said Patient had a good night. Chaplain asked secretary to have comfort cart refreshed. Chaplain will follow up with family later.

## 2019-10-09 MED ORDER — MORPHINE SULFATE (PF) 4 MG/ML IV SOLN: 4.0000 mg | INTRAVENOUS | Status: DC | PRN

## 2019-10-09 MED ORDER — MORPHINE SULFATE (PF) 2 MG/ML IV SOLN
2.0000 mg | INTRAVENOUS | Status: DC | PRN
  Administered 2019-10-09 (×2): 2 mg via SUBCUTANEOUS
  Filled 2019-10-09 (×2): qty 1

## 2019-10-11 NOTE — Progress Notes (Signed)
Family at bedside, morphine subcutaneous given as ordered, tylenol suppository given for temp 101.5 axillary. Patient repositioned to left side. Terrial Rhodes

## 2019-10-11 NOTE — Death Summary Note (Signed)
Patient was admitted to the hospital for a small bowel obstruction.  Given her advanced age and comorbidities she was deemed too high risk for laparotomy and surgical intervention.  We attempted medical treatment with NG tube placement and gastric decompression however the patient did not tolerate the placement.  At this time discussed overall prognosis with the patient's daughter.  The decision was made considering lack of reasonable options we will proceed with comfort measures.  All medications not focused on patient comfort were discontinued.  Patient was treated only with morphine, Ativan, Robinul, any other interventions focused on her comfort.  She remained comfortable and stable in no pain.  Daughter remained at bedside for majority of comfort care time.  At approximately 500s on Oct 30, 2019 the bedside nurse was called to the room by the daughter.  Nurse noted the patient had no respirations or heart rate and call time of death.  Time of death: 0445  Nurse, adult

## 2019-10-11 NOTE — Progress Notes (Signed)
Family called and ask for nurse to come to room as patient had appeared to expire, patient was assessed for heart beat  by this RN and a second RN , after careful assessment by both RN's patient was pronounced at 30 am . Chaplain was called for family and AC was notified. Katelyn [name]

## 2019-10-11 NOTE — Progress Notes (Signed)
   11/05/19 0455  Clinical Encounter Type  Visited With Family  Visit Type Death  Referral From Nurse  Consult/Referral To Chaplain  Chaplain responded to page. When chaplain called nurse said patient passed and family asked for chaplain to be called. When chaplain entered the room, patient's two daughters were at bedside talking. Chaplain stay with family until staff came to remove patient's body. Chaplain asked nurse for release form and facilitated getting family's questioned answered. The daughters shared many stories about their mother and family with chaplain. When staff came to remove body, chaplain had gentleman to take the box another way until she could escort family to the elevator and he did. Chaplain walked daughters to their car and wished them well.

## 2019-10-11 DEATH — deceased
